# Patient Record
Sex: Male | Born: 1965 | Race: White | Hispanic: No | Marital: Single | Smoking: Never smoker
Health system: Southern US, Community
[De-identification: ages and names within clinical notes are randomized; demographics above are authoritative.]

## PROBLEM LIST (undated history)

## (undated) DIAGNOSIS — Z87442 Personal history of urinary calculi: Secondary | ICD-10-CM

## (undated) DIAGNOSIS — K635 Polyp of colon: Secondary | ICD-10-CM

## (undated) DIAGNOSIS — I493 Ventricular premature depolarization: Secondary | ICD-10-CM

## (undated) DIAGNOSIS — M129 Arthropathy, unspecified: Secondary | ICD-10-CM

## (undated) DIAGNOSIS — I10 Essential (primary) hypertension: Secondary | ICD-10-CM

## (undated) DIAGNOSIS — K5732 Diverticulitis of large intestine without perforation or abscess without bleeding: Secondary | ICD-10-CM

## (undated) DIAGNOSIS — K5190 Ulcerative colitis, unspecified, without complications: Secondary | ICD-10-CM

## (undated) DIAGNOSIS — C4491 Basal cell carcinoma of skin, unspecified: Secondary | ICD-10-CM

## (undated) DIAGNOSIS — I499 Cardiac arrhythmia, unspecified: Secondary | ICD-10-CM

## (undated) DIAGNOSIS — Z9189 Other specified personal risk factors, not elsewhere classified: Secondary | ICD-10-CM

## (undated) DIAGNOSIS — M4802 Spinal stenosis, cervical region: Secondary | ICD-10-CM

## (undated) DIAGNOSIS — G43109 Migraine with aura, not intractable, without status migrainosus: Principal | ICD-10-CM

## (undated) DIAGNOSIS — I2510 Atherosclerotic heart disease of native coronary artery without angina pectoris: Secondary | ICD-10-CM

## (undated) DIAGNOSIS — I4891 Unspecified atrial fibrillation: Secondary | ICD-10-CM

## (undated) DIAGNOSIS — T7840XA Allergy, unspecified, initial encounter: Secondary | ICD-10-CM

## (undated) DIAGNOSIS — K219 Gastro-esophageal reflux disease without esophagitis: Secondary | ICD-10-CM

## (undated) DIAGNOSIS — M1990 Unspecified osteoarthritis, unspecified site: Secondary | ICD-10-CM

## (undated) DIAGNOSIS — M5010 Cervical disc disorder with radiculopathy, unspecified cervical region: Principal | ICD-10-CM

## (undated) DIAGNOSIS — F329 Major depressive disorder, single episode, unspecified: Secondary | ICD-10-CM

## (undated) HISTORY — DX: Spinal stenosis, cervical region: M48.02

## (undated) HISTORY — DX: Major depressive disorder, single episode, unspecified: F32.9

## (undated) HISTORY — DX: Migraine with aura, not intractable, without status migrainosus: G43.109

## (undated) HISTORY — DX: Other specified personal risk factors, not elsewhere classified: Z91.89

## (undated) HISTORY — DX: Basal cell carcinoma of skin, unspecified: C44.91

## (undated) HISTORY — PX: FOOT SURGERY: SHX648

## (undated) HISTORY — DX: Ventricular premature depolarization: I49.3

## (undated) HISTORY — DX: Allergy, unspecified, initial encounter: T78.40XA

## (undated) HISTORY — PX: ROTATOR CUFF REPAIR: SHX139

## (undated) HISTORY — PX: COLONOSCOPY: SHX5424

## (undated) HISTORY — DX: Arthropathy, unspecified: M12.9

## (undated) HISTORY — DX: Unspecified atrial fibrillation: I48.91

## (undated) HISTORY — PX: CARDIAC CATHETERIZATION: SHX172

## (undated) HISTORY — PX: OTHER SURGICAL HISTORY: SHX169

## (undated) HISTORY — DX: Diverticulitis of large intestine without perforation or abscess without bleeding: K57.32

## (undated) HISTORY — PX: TONSILLECTOMY: SHX5217

## (undated) HISTORY — DX: Cervical disc disorder with radiculopathy, unspecified cervical region: M50.10

## (undated) HISTORY — DX: Atherosclerotic heart disease of native coronary artery without angina pectoris: I25.10

## (undated) HISTORY — DX: Ulcerative colitis, unspecified, without complications: K51.90

## (undated) SURGERY — Surgical Case
Anesthesia: *Unknown

---

## 2005-03-31 ENCOUNTER — Inpatient Hospital Stay: Payer: Self-pay | Admitting: Internal Medicine

## 2005-03-31 ENCOUNTER — Other Ambulatory Visit: Payer: Self-pay

## 2005-10-14 ENCOUNTER — Ambulatory Visit: Payer: Self-pay | Admitting: Internal Medicine

## 2006-01-11 ENCOUNTER — Emergency Department: Payer: Self-pay | Admitting: Emergency Medicine

## 2006-04-09 ENCOUNTER — Ambulatory Visit: Payer: Self-pay | Admitting: Internal Medicine

## 2008-05-29 ENCOUNTER — Ambulatory Visit: Payer: Self-pay | Admitting: Internal Medicine

## 2008-05-29 ENCOUNTER — Emergency Department: Payer: Self-pay | Admitting: Internal Medicine

## 2008-05-30 ENCOUNTER — Inpatient Hospital Stay: Payer: Self-pay | Admitting: Internal Medicine

## 2008-05-30 ENCOUNTER — Other Ambulatory Visit: Payer: Self-pay

## 2008-06-20 ENCOUNTER — Ambulatory Visit: Payer: Self-pay | Admitting: Cardiology

## 2008-07-17 ENCOUNTER — Ambulatory Visit: Payer: Self-pay | Admitting: Cardiology

## 2008-07-19 ENCOUNTER — Ambulatory Visit: Payer: Self-pay | Admitting: Cardiovascular Disease

## 2008-07-26 ENCOUNTER — Ambulatory Visit: Payer: Self-pay | Admitting: Cardiovascular Disease

## 2008-07-31 ENCOUNTER — Ambulatory Visit: Payer: Self-pay | Admitting: Cardiology

## 2008-08-04 ENCOUNTER — Ambulatory Visit: Payer: Self-pay

## 2008-08-11 ENCOUNTER — Ambulatory Visit: Payer: Self-pay | Admitting: Cardiology

## 2008-08-22 ENCOUNTER — Ambulatory Visit: Payer: Self-pay | Admitting: Internal Medicine

## 2008-08-25 ENCOUNTER — Ambulatory Visit: Payer: Self-pay | Admitting: Cardiovascular Disease

## 2008-09-08 ENCOUNTER — Ambulatory Visit: Payer: Self-pay | Admitting: Cardiology

## 2008-09-26 ENCOUNTER — Ambulatory Visit: Payer: Self-pay | Admitting: Cardiology

## 2008-10-06 ENCOUNTER — Ambulatory Visit: Payer: Self-pay | Admitting: Specialist

## 2008-12-26 ENCOUNTER — Ambulatory Visit: Payer: Self-pay | Admitting: Cardiology

## 2009-06-07 ENCOUNTER — Ambulatory Visit: Payer: Self-pay | Admitting: Family Medicine

## 2009-06-07 DIAGNOSIS — F3289 Other specified depressive episodes: Secondary | ICD-10-CM

## 2009-06-07 DIAGNOSIS — K5732 Diverticulitis of large intestine without perforation or abscess without bleeding: Secondary | ICD-10-CM | POA: Insufficient documentation

## 2009-06-07 DIAGNOSIS — M129 Arthropathy, unspecified: Secondary | ICD-10-CM | POA: Insufficient documentation

## 2009-06-07 DIAGNOSIS — F329 Major depressive disorder, single episode, unspecified: Secondary | ICD-10-CM | POA: Insufficient documentation

## 2009-06-07 DIAGNOSIS — Z9189 Other specified personal risk factors, not elsewhere classified: Secondary | ICD-10-CM | POA: Insufficient documentation

## 2009-06-07 DIAGNOSIS — I4891 Unspecified atrial fibrillation: Secondary | ICD-10-CM | POA: Insufficient documentation

## 2009-06-07 DIAGNOSIS — Z6841 Body Mass Index (BMI) 40.0 and over, adult: Secondary | ICD-10-CM

## 2009-06-07 HISTORY — DX: Arthropathy, unspecified: M12.9

## 2009-06-07 HISTORY — DX: Major depressive disorder, single episode, unspecified: F32.9

## 2009-06-07 HISTORY — DX: Other specified depressive episodes: F32.89

## 2009-06-07 HISTORY — DX: Unspecified atrial fibrillation: I48.91

## 2009-06-07 HISTORY — DX: Diverticulitis of large intestine without perforation or abscess without bleeding: K57.32

## 2009-06-07 LAB — CONVERTED CEMR LAB
ALT: 26 units/L (ref 0–53)
Albumin: 3.9 g/dL (ref 3.5–5.2)
BUN: 12 mg/dL (ref 6–23)
Chloride: 106 meq/L (ref 96–112)
Cholesterol: 141 mg/dL (ref 0–200)
Eosinophils Relative: 1.6 % (ref 0.0–5.0)
Glucose, Bld: 85 mg/dL (ref 70–99)
HCT: 43.2 % (ref 39.0–52.0)
Hemoglobin: 15.2 g/dL (ref 13.0–17.0)
Lymphs Abs: 1.6 10*3/uL (ref 0.7–4.0)
MCV: 87.3 fL (ref 78.0–100.0)
Monocytes Absolute: 0.3 10*3/uL (ref 0.1–1.0)
Neutro Abs: 3.6 10*3/uL (ref 1.4–7.7)
Platelets: 179 10*3/uL (ref 150.0–400.0)
Potassium: 4.5 meq/L (ref 3.5–5.1)
RDW: 13.3 % (ref 11.5–14.6)
Total Bilirubin: 1 mg/dL (ref 0.3–1.2)
WBC: 5.7 10*3/uL (ref 4.5–10.5)

## 2009-06-21 ENCOUNTER — Encounter: Payer: Self-pay | Admitting: Family Medicine

## 2009-06-22 ENCOUNTER — Encounter: Payer: Self-pay | Admitting: Family Medicine

## 2009-07-03 ENCOUNTER — Ambulatory Visit: Payer: Self-pay | Admitting: Cardiology

## 2009-07-27 ENCOUNTER — Telehealth: Payer: Self-pay | Admitting: Cardiology

## 2009-08-08 ENCOUNTER — Encounter: Payer: Self-pay | Admitting: Family Medicine

## 2009-08-13 ENCOUNTER — Encounter: Payer: Self-pay | Admitting: Family Medicine

## 2009-08-13 ENCOUNTER — Ambulatory Visit: Payer: Self-pay | Admitting: Urology

## 2009-09-26 ENCOUNTER — Telehealth: Payer: Self-pay | Admitting: Cardiology

## 2009-11-15 ENCOUNTER — Ambulatory Visit: Payer: Self-pay | Admitting: Family Medicine

## 2010-01-24 ENCOUNTER — Ambulatory Visit: Payer: Self-pay | Admitting: Cardiology

## 2010-01-25 ENCOUNTER — Encounter: Payer: Self-pay | Admitting: Cardiology

## 2010-07-31 ENCOUNTER — Ambulatory Visit: Payer: Self-pay | Admitting: Cardiology

## 2010-07-31 DIAGNOSIS — E785 Hyperlipidemia, unspecified: Secondary | ICD-10-CM | POA: Insufficient documentation

## 2010-08-02 LAB — CONVERTED CEMR LAB
ALT: 34 units/L (ref 0–53)
AST: 24 units/L (ref 0–37)
Albumin: 4.2 g/dL (ref 3.5–5.2)
Calcium: 9.4 mg/dL (ref 8.4–10.5)
Cholesterol: 152 mg/dL (ref 0–200)
GFR calc non Af Amer: 91.6 mL/min (ref >60)
Glucose, Bld: 80 mg/dL (ref 70–99)
HDL: 28.3 mg/dL — ABNORMAL LOW (ref >39.00)
Potassium: 4 meq/L (ref 3.5–5.1)
Sodium: 139 meq/L (ref 135–145)
Total Bilirubin: 0.7 mg/dL (ref 0.3–1.2)
Total Protein: 7.4 g/dL (ref 6.0–8.3)
Triglycerides: 133 mg/dL (ref 0.0–149.0)
VLDL: 26.6 mg/dL (ref 0.0–40.0)

## 2010-08-12 ENCOUNTER — Telehealth: Payer: Self-pay | Admitting: Cardiology

## 2010-11-26 ENCOUNTER — Telehealth (INDEPENDENT_AMBULATORY_CARE_PROVIDER_SITE_OTHER): Payer: Self-pay | Admitting: *Deleted

## 2010-11-29 ENCOUNTER — Other Ambulatory Visit: Payer: Self-pay | Admitting: Family Medicine

## 2010-11-29 ENCOUNTER — Ambulatory Visit
Admission: RE | Admit: 2010-11-29 | Discharge: 2010-11-29 | Payer: Self-pay | Source: Home / Self Care | Attending: Family Medicine | Admitting: Family Medicine

## 2010-11-29 LAB — CBC WITH DIFFERENTIAL/PLATELET
Basophils Absolute: 0 10*3/uL (ref 0.0–0.1)
Eosinophils Absolute: 0.2 10*3/uL (ref 0.0–0.7)
MCHC: 33.9 g/dL (ref 30.0–36.0)
MCV: 88.4 fl (ref 78.0–100.0)
Monocytes Absolute: 0.5 10*3/uL (ref 0.1–1.0)
Neutrophils Relative %: 61.2 % (ref 43.0–77.0)
Platelets: 230 10*3/uL (ref 150.0–400.0)
RDW: 14.6 % (ref 11.5–14.6)
WBC: 6.4 10*3/uL (ref 4.5–10.5)

## 2010-11-29 LAB — PSA: PSA: 0.49 ng/mL (ref 0.10–4.00)

## 2010-12-03 NOTE — Progress Notes (Signed)
Summary: pt has questions   Phone Note Call from Patient Call back at Home Phone [phone]   Caller: Patient Reason for Call: Talk to Nurse, Talk to Doctor Summary of Call: pt has questions regarding dietary suppliment Initial call taken by: Omer Jack,  August 12, 2010 12:04 PM  Follow-up for Phone Call        Pt would like Dr. Vern Claude approval for a dietary supplement which has nitrous oxide to stimulate blood vessels.  He does not remember the name of the supplement which comes from [facility]. He will fax [place] the information. Mylo Red RN

## 2010-12-03 NOTE — Assessment & Plan Note (Signed)
Summary: 52m f/u  Medications Added FLECAINIDE ACETATE 100 MG TABS (FLECAINIDE ACETATE) Take one tablet by mouth every 12 hours OMEPRAZOLE 20 MG CPDR (OMEPRAZOLE) 1 tab as needed        Visit Type:  6 mo f/u Primary Provider:  Hannah Beat MD  CC:  pt states no cardiac complaints today.  History of Present Illness: Brandon [name] comes in today for followup of his atrial fibrillation.  He's had no clinical recurrence. He has done remarkably well on flecainide.  His biggest concern is his weight gain. He tries to eat remarkably healthy but continues to gain weight. I reviewed his lipid panel today with him which looks favorable except for low HDL. Obviously, his diet his pain often this regard.  His swelling has been under control. His blood pressures also been better. He is very compliant with his medications and salt restriction.  Current Medications (verified): 1)  Diltiazem Hcl Coated Beads 240 Mg Xr24h-Cap (Diltiazem Hcl Coated Beads) .Marland Kitchen.. 1 Time A Day 2)  Flecainide Acetate 100 Mg Tabs (Flecainide Acetate) .... Take One Tablet By Mouth Every 12 Hours 3)  Aspirin 325 Mg Tabs (Aspirin) .... Once A Day 4)  Omeprazole 20 Mg Cpdr (Omeprazole) .Marland Kitchen.. 1 Tab As Needed  Allergies: 1)  ! Penicillin  Past History:  Past Surgical History: Last updated: 06/07/2009 Hosp, 2009, GU bleed  Review of Systems       negative other than history of present illness  Vital Signs:  Patient profile:   45 year old male Height:      74 inches Weight:      314 pounds BMI:     40.46 Pulse rate:   56 / minute Pulse rhythm:   irregular BP sitting:   118 / 80  (left arm) Cuff size:   large  Vitals Entered By: Danielle Rankin, CMA (January 24, 2010 9:55 AM)  Physical Exam  General:  obese.  muscularobese.   Head:  normocephalic and atraumatic Eyes:  PERRLA/EOM intact; conjunctiva and lids normal. Neck:  Neck supple, no JVD. No masses, thyromegaly or abnormal cervical nodes. Lungs:  Clear  bilaterally to auscultation and percussion. Heart:  Non-displaced PMI, chest non-tender; regular rate and rhythm, S1, S2 without murmurs, rubs or gallops. Carotid upstroke normal, no bruit. Normal abdominal aortic size, no bruits. Femorals normal pulses, no bruits. Pedals normal pulses. No edema, no varicosities. Msk:  Back normal, normal gait. Muscle strength and tone normal. Pulses:  pulses normal in all 4 extremities Extremities:  trace left pedal edema and trace right pedal edema.  trace left pedal edema.   Neurologic:  Alert and oriented x 3. Skin:  Intact without lesions or rashes. Psych:  depressed affect.  depressed affect.     EKG  Procedure date:  01/24/2010  Findings:       sinus bradycardia, PR QRS and QTC normal., no change  Impression & Recommendations:  Problem # 1:  ATRIAL FIBRILLATION, PAROXYSMAL (ICD-427.31) Assessment Improved  His updated medication list for this problem includes:    Flecainide Acetate 100 Mg Tabs (Flecainide acetate) .Marland Kitchen... Take one tablet by mouth every 12 hours    Aspirin 325 Mg Tabs (Aspirin) ..... Once a day  Problem # 2:  OBESITY (ICD-278.00) Assessment: Deteriorated  Problem # 3:  CHOLESTEROL, SCREENING (ICD-V77.91) I reviewed his numbers today obtain a primary care. Have encouraged continue with his healthy diet and his weight doesn't seem to respond. He is pretty discouraged by this. Trauma the pain  anymore.  Patient Instructions: 1)  Your physician recommends that you schedule a follow-up appointment in: 6 months with dr Leilah Polimeni 2)  Your physician recommends that you continue on your current medications as directed. Please refer to the Current Medication list given to you today.

## 2010-12-03 NOTE — Progress Notes (Signed)
Summary: [facility]   Imported By: Harlon Flor 02/06/2010 09:07:10  _____________________________________________________________________  External Attachment:    Type:   Image     Comment:   External Document

## 2010-12-03 NOTE — Assessment & Plan Note (Signed)
Summary: per check out/sf  Medications Added FLECAINIDE ACETATE 100 MG TABS (FLECAINIDE ACETATE) Take one tablet by mouth every 12 hours OMEPRAZOLE 20 MG CPDR (OMEPRAZOLE) 1 tab M, W, F FUROSEMIDE 20 MG TABS (FUROSEMIDE) Take one tablet by mouth every morning as needed for swelling        Visit Type:  6 mo f/u Primary Provider:  Hannah Beat MD  CC:  no cardiac complaints today..pt states he did not sleep well llast night.  History of Present Illness: [name] returns today for management of his atrial fibrillation,hyperlipidemia, hypertension, and now increased lower extremity edema. He is very unhappy about the latter like something for it.  He's had a Systems analyst. His measurements have improved his weight has not. I suspect his muscle mass.  He stands on his feet a lot during the day and on concrete. He says significant edema. He denies orthopnea or PND.  Denies any chest pain or angina. He has not had any recurrent symptoms of atrial fibrillation.  Current Medications (verified): 1)  Diltiazem Hcl Coated Beads 240 Mg Xr24h-Cap (Diltiazem Hcl Coated Beads) .Marland Kitchen.. 1 Time A Day 2)  Flecainide Acetate 100 Mg Tabs (Flecainide Acetate) .... Take One Tablet By Mouth Every 12 Hours 3)  Aspirin 325 Mg Tabs (Aspirin) .... Once A Day 4)  Omeprazole 20 Mg Cpdr (Omeprazole) .Marland Kitchen.. 1 Tab M, W, F  Allergies: 1)  ! Penicillin  Past History:  Past Medical History: Last updated: 06/07/2009 DIVERTICULITIS, COLON (ICD-562.11) DEPRESSION (ICD-311) CHICKENPOX, HX OF (ICD-V15.9) ARTHRITIS (ICD-716.90) Macroscopic hematuria, 2009, hosp A. Fib, rate controlled    Past Surgical History: Last updated: 06/07/2009 Hosp, 2009, GU bleed  Family History: Last updated: 06/07/2009 Family History of Arthritis Family History Diabetes 1st degree relative Family History of Stroke F 1st degree relative <60 Family History of Stroke M 1st degree relative <50  Social History: Last updated:  06/07/2009 Occupation: Self employeed Single Never Smoked, former dip - quit about 1 year ago Alcohol use-no, rare Drug use-no Regular exercise-no  Risk Factors: Alcohol Use: <1 (11/15/2009) Diet: poor (11/15/2009) Exercise: no (11/15/2009)  Risk Factors: Smoking Status: never (11/15/2009)  Review of Systems       negative other than history of present illness  Vital Signs:  Patient profile:   45 year old male Height:      74 inches Weight:      316.8 pounds BMI:     40.82 Pulse rate:   66 / minute Pulse rhythm:   irregular BP sitting:   122 / 90  (left arm) Cuff size:   large  Vitals Entered By: Danielle Rankin, CMA (July 31, 2010 4:10 PM)  Physical Exam  General:  muscular, overweight, in no acute distress Head:  normocephalic and atraumatic Eyes:  PERRLA/EOM intact; conjunctiva and lids normal. Neck:  Neck supple, no JVD. No masses, thyromegaly or abnormal cervical nodes. Chest Wall:  no deformities or breast masses noted Lungs:  Clear bilaterally to auscultation and percussion. Heart:  M. poorly appreciated, regular rate and rhythm, normal S1-S2, no carotid bruits Msk:  Back normal, normal gait. Muscle strength and tone normal. Pulses:  pulses normal in all 4 extremities Extremities:  1+ left pedal edema and 1+ right pedal edema.   Neurologic:  Alert and oriented x 3. Skin:  Intact without lesions or rashes. Psych:  Normal affect.   Problems:  Medical Problems Added: 1)  Dx of Edema  (ICD-782.3) 2)  Dx of Hyperlipidemia  (ICD-272.4)  EKG  Procedure date:  07/31/2010  Findings:      normal sinus rhythm, low voltage, normal PR QRS and QTC intervals.  Impression & Recommendations:  Problem # 1:  ATRIAL FIBRILLATION, PAROXYSMAL (ICD-427.31) He continues to do well on flecainide. No changes. Continue aspirin. His updated medication list for this problem includes:    Flecainide Acetate 100 Mg Tabs (Flecainide acetate) .Marland Kitchen... Take one tablet by mouth  every 12 hours    Aspirin 325 Mg Tabs (Aspirin) ..... Once a day  Orders: EKG w/ Interpretation (93000)  Problem # 2:  OBESITY (ICD-278.00) Assessment: Unchanged  Problem # 3:  CHOLESTEROL, SCREENING (ICD-V77.91) I will check lipids and LFTs today.  Problem # 4:  EDEMA (ICD-782.3) he clearly doesn't like the swelling. We'll start low-dose Lasix p.r.n. 20 mg q.a.m. p.r.n. Potassium rich diet reviewed.  Other Orders: TLB-BMP (Basic Metabolic Panel-BMET) (80048-METABOL) TLB-Lipid Panel (80061-LIPID) TLB-Hepatic/Liver Function Pnl (80076-HEPATIC)   Patient Instructions: 1)  Your physician recommends that you schedule a follow-up appointment in: 6 months with Dr. Daleen Squibb 2)  Your physician recommends that you return for lab work in: 3)  Your physician has requested that you limit the intake of sodium (salt) in your diet to two grams daily. Please see [facility] handout. Prescriptions: FUROSEMIDE 20 MG TABS (FUROSEMIDE) Take one tablet by mouth every morning as needed for swelling  #30 x 6   Entered by:   Lisabeth Devoid RN   Authorized by:   Gaylord Shih, MD, [facility]   Signed by:   Lisabeth Devoid RN on 07/31/2010   Method used:   Electronically to        [facility]  [address] (retail)       [address], [address]       [place], [place]  [address]       Ph: [phone]       Fax: [phone]   RxID:   [id number]

## 2010-12-03 NOTE — Assessment & Plan Note (Signed)
Summary: CPX/RBH   Vital Signs:  Patient profile:   45 year old male Height:      74 inches Weight:      306.2 pounds BMI:     39.46 Temp:     98.5 degrees F oral Pulse rate:   62 / minute Pulse rhythm:   regular BP sitting:   110 / 80  (left arm) Cuff size:   large  Vitals Entered By: Benny Lennert CMA Duncan Dull) (November 15, 2009 8:26 AM)  History of Present Illness: Chief complaint cpx  Labs all OK from 4 months ago  Having a lot of trouble with acid reflux  Taking some omeprazole. Took some nexium and that seemed to help better. Took for about three weeks. Diet is not good Not eating much vegetables.  Splitting some wood for exercise Prilosec is working ok, only taking every other day     Preventive Screening-Counseling & Management  Alcohol-Tobacco     Alcohol drinks/day: <1     Alcohol Counseling: not indicated; use of alcohol is not excessive or problematic     Smoking Status: never     Tobacco Counseling: not indicated; no tobacco use  Caffeine-Diet-Exercise     Diet Comments: poor     Diet Counseling: to improve diet; diet is suboptimal     Does Patient Exercise: no     Exercise Counseling: to improve exercise regimen  Hep-HIV-STD-Contraception     STD Risk Counseling: to avoid increased STD risk     Testicular SE Education/Counseling to perform regular STE      Sexual History:  currently monogamous.        Drug Use:  never.    Clinical Review Panels:  Immunizations   Last Flu Vaccine:  Fluvax 3+ (11/15/2009)  Lipid Management   Cholesterol:  141 (06/07/2009)   LDL (bad choesterol):  102 (06/07/2009)   HDL (good cholesterol):  28.20 (06/07/2009)  Diabetes Management   Creatinine:  0.9 (06/07/2009)   Last Flu Vaccine:  Fluvax 3+ (11/15/2009)  CBC   WBC:  5.7 (06/07/2009)   RBC:  4.94 (06/07/2009)   Hgb:  15.2 (06/07/2009)   Hct:  43.2 (06/07/2009)   Platelets:  179.0 (06/07/2009)   MCV  87.3 (06/07/2009)   MCHC  35.2 (06/07/2009)   RDW   13.3 (06/07/2009)   PMN:  64.6 (06/07/2009)   Lymphs:  27.2 (06/07/2009)   Monos:  5.6 (06/07/2009)   Eosinophils:  1.6 (06/07/2009)   Basophil:  1.0 (06/07/2009)  Complete Metabolic Panel   Glucose:  85 (06/07/2009)   Sodium:  141 (06/07/2009)   Potassium:  4.5 (06/07/2009)   Chloride:  106 (06/07/2009)   CO2:  28 (06/07/2009)   BUN:  12 (06/07/2009)   Creatinine:  0.9 (06/07/2009)   Albumin:  3.9 (06/07/2009)   Total Protein:  7.1 (06/07/2009)   Calcium:  9.4 (06/07/2009)   Total Bili:  1.0 (06/07/2009)   Alk Phos:  42 (06/07/2009)   SGPT (ALT):  26 (06/07/2009)   SGOT (AST):  21 (06/07/2009)   Allergies: 1)  ! Penicillin  Past History:  Past medical, surgical, family and social histories (including risk factors) reviewed, and no changes noted (except as noted below).  Past Medical History: Reviewed history from 06/07/2009 and no changes required. DIVERTICULITIS, COLON (ICD-562.11) DEPRESSION (ICD-311) CHICKENPOX, HX OF (ICD-V15.9) ARTHRITIS (ICD-716.90) Macroscopic hematuria, 2009, hosp A. Fib, rate controlled    Past Surgical History: Reviewed history from 06/07/2009 and no changes required. [place], 2009, GU  bleed  Family History: Reviewed history from 06/07/2009 and no changes required. Family History of Arthritis Family History Diabetes 1st degree relative Family History of Stroke F 1st degree relative <60 Family History of Stroke M 1st degree relative <50  Social History: Reviewed history from 06/07/2009 and no changes required. Occupation: Self employeed Single Never Smoked, former dip - quit about 1 year ago Alcohol use-no, rare Drug use-no Regular exercise-no Sexual History:  currently monogamous Drug Use:  never  Review of Systems  General: Denies fever, chills, sweats, anorexia, fatigue, weakness, malaise Eyes: Denies blurring, vision loss ENT: Denies earache, nasal congestion, nosebleeds, sore throat, and hoarseness.  Cardiovascular:  Denies chest pains, palpitations, syncope, dyspnea on exertion,  Respiratory: Denies cough, dyspnea at rest, excessive sputum,wheeezing GI: Denies nausea, vomiting, diarrhea, constipation, change in bowel habits, abdominal pain, melena, hematochezia, GERD NOTED GU: Denies dysuria, hematuria, discharge, urinary frequency, urinary hesitancy, nocturia, incontinence, genital sores, decreased libido Musculoskeletal: Denies back pain, joint pain Derm: Denies rash, itching Neuro: Denies  paresthesias, frequent falls, frequent headaches, and difficulty walking.  Psych: Denies depression, anxiety Endocrine: Denies cold intolerance, heat intolerance, polydipsia, polyphagia, polyuria, and unusual weight change.  Heme: Denies enlarged lymph nodes Allergy: No hayfever   Physical Exam  General:  Well-developed,well-nourished,in no acute distress; alert,appropriate and cooperative throughout examination Head:  Normocephalic and atraumatic without obvious abnormalities. No apparent alopecia or balding. Eyes:  vision grossly intact.   Ears:  External ear exam shows no significant lesions or deformities.  Otoscopic examination reveals clear canals, tympanic membranes are intact bilaterally without bulging, retraction, inflammation or discharge. Hearing is grossly normal bilaterally. Nose:  External nasal examination shows no deformity or inflammation. Nasal mucosa are pink and moist without lesions or exudates. Mouth:  Oral mucosa and oropharynx without lesions or exudates.  Teeth in good repair. Neck:  No deformities, masses, or tenderness noted. Chest Wall:  No deformities, masses, tenderness or gynecomastia noted. Lungs:  Normal respiratory effort, chest expands symmetrically. Lungs are clear to auscultation, no crackles or wheezes. Heart:  Normal rate and regular rhythm. S1 and S2 normal without gallop, murmur, click, rub or other extra sounds. Abdomen:  Bowel sounds positive,abdomen soft and non-tender  without masses, organomegaly or hernias noted. obese Rectal:  No external abnormalities noted. Normal sphincter tone. No rectal masses or tenderness. Genitalia:  Testes bilaterally descended without nodularity, tenderness or masses. No scrotal masses or lesions. No penis lesions or urethral discharge. Prostate:  Prostate gland firm and smooth, no enlargement, nodularity, tenderness, mass, asymmetry or induration. Msk:  normal ROM and no crepitation.   Extremities:  No clubbing, cyanosis, edema, or deformity noted with normal full range of motion of all joints.   Neurologic:  alert & oriented X3, sensation intact to light touch, and gait normal.   Skin:  Intact without suspicious lesions or rashes Cervical Nodes:  No lymphadenopathy noted Inguinal Nodes:  No significant adenopathy Psych:  Cognition and judgment appear intact. Alert and cooperative with normal attention span and concentration. No apparent delusions, illusions, hallucinations   Impression & Recommendations:  Problem # 1:  HEALTH MAINTENANCE EXAM (ICD-V70.0) The patient's preventative maintenance and recommended screening tests for an annual wellness exam were reviewed in full today. Brought up to date unless services declined.  Counselled on the importance of diet, exercise, and its role in overall health and mortality. The patient's FH and SH was reviewed, including their home life, tobacco status, and drug and alcohol status.   Needs to work on losing  weight  Complete Medication List: 1)  Diltiazem Hcl Coated Beads 240 Mg Xr24h-cap (Diltiazem hcl coated beads) .Marland Kitchen.. 1 time a day 2)  Flecainide Acetate 100 Mg Tabs (Flecainide acetate) .... Take one tablet by mouth every 12 hours 3)  Aspirin 325 Mg Tabs (Aspirin) .... Once a day  Other Orders: Admin 1st Vaccine (60454) Flu Vaccine 20yrs + (09811)  Patient Instructions: 1)  physical in a year.  Current Allergies (reviewed today): ! PENICILLIN      Flu Vaccine  Consent Questions     Do you have a history of severe allergic reactions to this vaccine? no    Any prior history of allergic reactions to egg and/or gelatin? no    Do you have a sensitivity to the preservative Thimersol? no    Do you have a past history of Guillan-Barre Syndrome? no    Do you currently have an acute febrile illness? no    Have you ever had a severe reaction to latex? no    Vaccine information given and explained to patient? yes    Are you currently pregnant? no    Lot Number:[id number]   Exp Date:05/02/2010   Site Given  Left Deltoid IMlbflu

## 2010-12-04 ENCOUNTER — Encounter: Payer: Self-pay | Admitting: Family Medicine

## 2010-12-04 ENCOUNTER — Ambulatory Visit: Admit: 2010-12-04 | Payer: Self-pay | Admitting: Family Medicine

## 2010-12-04 ENCOUNTER — Encounter (INDEPENDENT_AMBULATORY_CARE_PROVIDER_SITE_OTHER): Payer: BC Managed Care – PPO | Admitting: Family Medicine

## 2010-12-04 DIAGNOSIS — Z0000 Encounter for general adult medical examination without abnormal findings: Secondary | ICD-10-CM

## 2010-12-04 DIAGNOSIS — Z23 Encounter for immunization: Secondary | ICD-10-CM

## 2010-12-05 NOTE — Progress Notes (Signed)
----   Converted from flag ---- ---- 11/22/2010 1:33 PM, Owens Loffler MD wrote: PSA: v76.44 CBC with diff: v58.69  ---- 11/22/2010 12:51 PM, Daralene Milch CMA (AAMA) wrote: Lab orders please! Good Morning! This pt is scheduled for cpx labs Friday, which labs to draw and dx codes to use? Thanks Tasha ------------------------------

## 2010-12-11 NOTE — Assessment & Plan Note (Signed)
Summary: cpe CLE   Vital Signs:  Patient profile:   45 year old male Height:      74 inches Weight:      329.25 pounds BMI:     42.43 Temp:     98.8 degrees F oral Pulse rate:   72 / minute Pulse rhythm:   regular BP sitting:   110 / 72  (left arm) Cuff size:   large  Vitals Entered By: Benny Lennert CMA (AAMA) (December 04, 2010 8:40 AM)  History of Present Illness: 45 year old here for CPX:  No colon CA F Hx.  Shoulders and knees are bothering him some and hurting some.  No girlfriend.   Never had children. Married once.  Covered   Had a poodle, just died at 45 years of age.     Preventive Screening-Counseling & Management  Alcohol-Tobacco     Alcohol drinks/day: <1     Alcohol Counseling: not indicated; use of alcohol is not excessive or problematic     Smoking Status: never     Tobacco Counseling: not indicated; no tobacco use  Caffeine-Diet-Exercise     Diet Comments: poor     Diet Counseling: to improve diet; diet is suboptimal     Does Patient Exercise: yes     Type of exercise: Rush     Exercise (avg: min/session): >60     Times/week: 3     Exercise Counseling: to improve exercise regimen  Comments: quit dip 2 years ago  Hep-HIV-STD-Contraception     STD Risk Counseling: to avoid increased STD risk     Testicular SE Education/Counseling to perform regular STE  Safety-Violence-Falls     Seat Belt Use: yes      Drug Use:  never.    Clinical Review Panels:  Prevention   Last PSA:  0.49 (11/29/2010)  Immunizations   Last Tetanus Booster:  Tdap (12/04/2010)   Last Flu Vaccine:  Fluvax 3+ (11/15/2009)  Lipid Management   Cholesterol:  152 (07/31/2010)   LDL (bad choesterol):  97 (07/31/2010)   HDL (good cholesterol):  28.30 (07/31/2010)  Diabetes Management   Creatinine:  0.9 (07/31/2010)   Last Flu Vaccine:  Fluvax 3+ (11/15/2009)  CBC   WBC:  6.4 (11/29/2010)   RBC:  4.83 (11/29/2010)   Hgb:  14.4 (11/29/2010)   Hct:  42.7  (11/29/2010)   Platelets:  230.0 (11/29/2010)   MCV  88.4 (11/29/2010)   MCHC  33.9 (11/29/2010)   RDW  14.6 (11/29/2010)   PMN:  61.2 (11/29/2010)   Lymphs:  28.6 (11/29/2010)   Monos:  7.1 (11/29/2010)   Eosinophils:  2.5 (11/29/2010)   Basophil:  0.6 (11/29/2010)  Complete Metabolic Panel   Glucose:  80 (07/31/2010)   Sodium:  139 (07/31/2010)   Potassium:  4.0 (07/31/2010)   Chloride:  107 (07/31/2010)   CO2:  25 (07/31/2010)   BUN:  18 (07/31/2010)   Creatinine:  0.9 (07/31/2010)   Albumin:  4.2 (07/31/2010)   Total Protein:  7.4 (07/31/2010)   Calcium:  9.4 (07/31/2010)   Total Bili:  0.7 (07/31/2010)   Alk Phos:  50 (07/31/2010)   SGPT (ALT):  34 (07/31/2010)   SGOT (AST):  24 (07/31/2010)   Current Problems (verified): 1)  Special Screening Malignant Neoplasm of Prostate  (ICD-V76.44) 2)  Long-term (CURRENT) Use of Other Medications  (ICD-V58.69) 3)  Hyperlipidemia  (ICD-272.4) 4)  Health Maintenance Exam  (ICD-V70.0) 5)  Atrial Fibrillation, Paroxysmal  (ICD-427.31) 6)  Obesity  (ICD-278.00) 7)  Diverticulitis, Colon  (ICD-562.11) 8)  Depression  (ICD-311) 9)  Chickenpox, Hx of  (ICD-V15.9) 10)  Arthritis  (ICD-716.90)  Allergies: 1)  ! Penicillin  Past History:  Past medical, surgical, family and social histories (including risk factors) reviewed, and no changes noted (except as noted below).  Past Medical History: Reviewed history from 06/07/2009 and no changes required. DIVERTICULITIS, COLON (ICD-562.11) DEPRESSION (ICD-311) CHICKENPOX, HX OF (ICD-V15.9) ARTHRITIS (ICD-716.90) Macroscopic hematuria, 2009, hosp A. Fib, rate controlled    Past Surgical History: Reviewed history from 06/07/2009 and no changes required. Hosp, 2009, GU bleed  Family History: Reviewed history from 06/07/2009 and no changes required. Family History of Arthritis Family History Diabetes 1st degree relative Family History of Stroke F 1st degree relative <60 Family  History of Stroke M 1st degree relative <50  Social History: Reviewed history from 06/07/2009 and no changes required. Occupation: Self employeed Single Never Smoked, former dip - quit about 1 year ago Alcohol use-no, rare Drug use-no Regular exercise-no Does Patient Exercise:  yes  Review of Systems  General: Denies fever, chills, sweats, anorexia, fatigue, weakness, malaise Eyes: VISION WORSENED SOME ENT: Denies earache, nasal congestion, nosebleeds, sore throat, and hoarseness.  Cardiovascular: Denies chest pains, palpitations, syncope, dyspnea on exertion,  Respiratory: Denies cough, dyspnea at rest, excessive sputum,wheeezing GI: Denies nausea, vomiting, diarrhea, constipation, change in bowel habits, abdominal pain, melena, hematochezia GU: Denies dysuria, hematuria, discharge, urinary frequency, urinary hesitancy, nocturia, incontinence, genital sores, decreased libido Musculoskeletal:INTERMITTENT KNEE AND SHOULDER PAIN Derm: Denies rash, itching Neuro: Denies  paresthesias, frequent falls, frequent headaches, and difficulty walking.  Psych: Denies depression, anxiety Endocrine: Denies cold intolerance, heat intolerance, polydipsia, polyphagia, polyuria, and unusual weight change.  Heme: Denies enlarged lymph nodes Allergy: No hayfever   Otherwise, the pertinent positives and negatives are listed above and in the HPI, otherwise a full review of systems has been reviewed and is negative unless noted positive.    Impression & Recommendations:  Problem # 1:  HEALTH MAINTENANCE EXAM (ICD-V70.0) The patient's preventative maintenance and recommended screening tests for an annual wellness exam were reviewed in full today. Brought up to date unless services declined.  Counselled on the importance of diet, exercise, and its role in overall health and mortality. The patient's FH and SH was reviewed, including their home life, tobacco status, and drug and alcohol status.    making strides to try lose weight -- down from 380. Trying to eat better  Complete Medication List: 1)  Diltiazem Hcl Coated Beads 240 Mg Xr24h-cap (Diltiazem hcl coated beads) .Marland Kitchen.. 1 time a day 2)  Flecainide Acetate 100 Mg Tabs (Flecainide acetate) .... Take one tablet by mouth every 12 hours 3)  Aspirin 325 Mg Tabs (Aspirin) .... Once a day 4)  Omeprazole 20 Mg Cpdr (Omeprazole) .Marland Kitchen.. 1 tab m, w, f 5)  Furosemide 20 Mg Tabs (Furosemide) .... Take one tablet by mouth every morning as needed for swelling  Other Orders: Tdap => 64yrs IM (16109) Admin 1st Vaccine (60454)   Orders Added: 1)  Tdap => 60yrs IM [90715] 2)  Admin 1st Vaccine [90471] 3)  Est. Patient 40-64 years [09811]   Immunizations Administered:  Tetanus Vaccine:    Vaccine Type: Tdap    Site: left deltoid    Mfr: GlaxoSmithKline    Dose: 0.5 ml    Route: IM    Given by: Benny Lennert CMA (AAMA)    Exp. Date: 08/22/2012    Lot #: [id number]  VIS given: 09/20/08 version given December 04, 2010.   Immunizations Administered:  Tetanus Vaccine:    Vaccine Type: Tdap    Site: left deltoid    Mfr: GlaxoSmithKline    Dose: 0.5 ml    Route: IM    Given by: Benny Lennert CMA (AAMA)    Exp. Date: 08/22/2012    Lot #: [id number]    VIS given: 09/20/08 version given December 04, 2010.  Prevention & Chronic Care Immunizations   Influenza vaccine: Fluvax 3+  (11/15/2009)   Influenza vaccine deferral: Not available  (12/04/2010)    Tetanus booster: 12/04/2010: Tdap    Pneumococcal vaccine: Not documented  Other Screening   Smoking status: never  (12/04/2010)  Lipids   Total Cholesterol: 152  (07/31/2010)   LDL: 97  (07/31/2010)   LDL Direct: Not documented   HDL: 28.30  (07/31/2010)   Triglycerides: 133.0  (07/31/2010)    SGOT (AST): 24  (07/31/2010)   SGPT (ALT): 34  (07/31/2010)   Alkaline phosphatase: 50  (07/31/2010)   Total bilirubin: 0.7  (07/31/2010)  Self-Management Support :     Lipid self-management support: Not documented    Nursing Instructions: Give tetanus booster today    Current Allergies (reviewed today): ! PENICILLIN  Physical Exam General Appearance: well developed, well nourished, no acute distress Eyes: conjunctiva and lids normal, PERRLA, EOMI Ears, Nose, Mouth, Throat: TM clear, nares clear, oral exam WNL Neck: supple, no lymphadenopathy, no thyromegaly, no JVD Respiratory: clear to auscultation and percussion, respiratory effort normal Cardiovascular: regular rate and rhythm, S1-S2, no murmur, rub or gallop, no bruits, peripheral pulses normal and symmetric, no cyanosis, clubbing, edema or varicosities Chest: no scars, masses, tenderness; no asymmetry, skin changes, nipple discharge, no gynecomastia   Gastrointestinal: soft, non-tender; no hepatosplenomegaly, masses; active bowel sounds all quadrants, no masses, tenderness, hemorrhoids  Genitourinary: no hernia, testicular mass, penile discharge, priapism or prostate enlargement Lymphatic: no cervical, axillary or inguinal adenopathy Musculoskeletal: gait normal, muscle tone and strength WNL, no joint swelling, effusions, discoloration, crepitus  Shoulder: B Inspection: No muscle wasting or winging Ecchymosis/edema: neg  AC joint, scapula, clavicle: NT Cervical spine: NT, full ROM Spurling's: neg Abduction: full, 5/5 Flexion: full, 5/5 IR, full, lift-off: 5/5 ER at neutral: full, 5/5 AC crossover and compression: neg Neer: neg Hawkins: neg Drop Test: neg Empty Can: neg Supraspinatus insertion: NT Bicipital groove: NT Speed's: neg Yergason's: neg Sulcus sign: neg Scapular dyskinesis: none C5-T1 intact Sensation intact Grip 5/5  Skin: clear, good turgor, color WNL, no rashes, lesions, or ulcerations Neurologic: normal mental status, normal reflexes, normal strength, sensation, and motion Psychiatric: alert; oriented to person, place and time Other Exam:

## 2011-02-10 ENCOUNTER — Encounter: Payer: Self-pay | Admitting: Cardiology

## 2011-02-14 ENCOUNTER — Encounter: Payer: Self-pay | Admitting: Cardiology

## 2011-02-14 ENCOUNTER — Ambulatory Visit (INDEPENDENT_AMBULATORY_CARE_PROVIDER_SITE_OTHER): Payer: BC Managed Care – PPO | Admitting: Cardiology

## 2011-02-14 VITALS — BP 126/86 | HR 57 | Resp 18 | Ht 74.0 in | Wt 324.8 lb

## 2011-02-14 DIAGNOSIS — I4891 Unspecified atrial fibrillation: Secondary | ICD-10-CM

## 2011-02-14 NOTE — Assessment & Plan Note (Signed)
Stable, no change in treatment.

## 2011-02-14 NOTE — Patient Instructions (Signed)
Your physician recommends that you schedule a follow-up appointment in: 6 months with Dr. Wall  

## 2011-02-14 NOTE — Progress Notes (Signed)
   Patient ID: Earl Losee, male    DOB: 15-Oct-1966, 45 y.o.   MRN: [id number]  HPI Mr Olivera returns for E and M of his PAF. He has had no clinical sxs of his PAF. He remains on Flecainide and ASA.  EKG show SB with normal intervals.    Review of Systems  All other systems reviewed and are negative.      Physical Exam  Constitutional: He is oriented to person, place, and time. He appears well-developed and well-nourished.       Muscular, obese.  HENT:  Head: Normocephalic and atraumatic.  Eyes: EOM are normal. Pupils are equal, round, and reactive to light.  Neck: Neck supple. No JVD present. No tracheal deviation present. No thyromegaly present.  Cardiovascular: Normal rate, regular rhythm, normal heart sounds and intact distal pulses.   No murmur heard. Pulmonary/Chest: Effort normal and breath sounds normal.  Abdominal: Soft. Bowel sounds are normal.  Musculoskeletal: Normal range of motion. He exhibits no edema.  Neurological: He is alert and oriented to person, place, and time.  Skin: Skin is warm and dry.  Psychiatric: He has a normal mood and affect.

## 2011-03-02 ENCOUNTER — Other Ambulatory Visit: Payer: Self-pay | Admitting: Cardiology

## 2011-03-18 NOTE — Assessment & Plan Note (Signed)
[facility] OFFICE NOTE   NAME:[name], [name]                  MRN:          [id number]  DATE:09/26/2008                            DOB:          [birth year]    [name] comes in today with history of paroxysmal atrial  fibrillation.  He has now been on flecainide for over a month and has  had no recurrences.   His basic complaint is continuing to gain weight.  He has had some lower  extremity edema.   He denies any presyncope, syncope, tachy palpitations.   CURRENT MEDICATONS:  1. Aspirin 325 mg a day.  2. Cartia XT 300 mg a day.  3. Flecainide 100 mg b.i.d.  4. Coumadin as directed.   His blood pressure today is 110/76, pulse is 50 and regular.  EKG shows  sinus brady with a normal PR, QRS, and QTc interval.  HEENT:  Unchanged.  NECK:  Shows no JVD.  LUNGS:  Clear to auscultation and percussion.  HEART:  Soft, S1 and S2, slow rate.  ABDOMEN:  Soft.  Extremities:  1+ edema pretibially.  NEURO:  Intact.   Brandon [name] has had no recurrences of atrial fib on flecainide.  He is  extremely symptomatic when he has these.  With a low [place] score, we will  stop his Coumadin.  In addition, with his peripheral edema and  bradycardia, we will decrease his Cartia XT to 240 mg per day.  I will  see him back again in 3 months.     Brandon C. Verl Blalock, MD, [facility]  Electronically Signed    TCW/MedQ  DD: 09/26/2008  DT: 09/27/2008  Job #: [id number]

## 2011-03-18 NOTE — Letter (Signed)
August 22, 2008    Marijo Conception. Verl Blalock, MD, [place], [place]  [place], [place]   RE:  GARRETT, MITCHUM  MRN:  [id number]  /  DOB:  [birth year]   Dear Brandon [name],   It was my pleasure to see your patient, Brandon [name], in EP  consultation today.  As you recall, he is a very pleasant 45 year old  gentleman with symptomatic persistent atrial fibrillation.  He reports  initially being diagnosed with atrial fibrillation 7-8 years ago.  In  retrospect, he has had symptomatic atrial fibrillation for 10-15 years.  He describes symptoms of abrupt onset and termination of irregular  palpitations.  These palpitations are frequently associated with  shortness of breath and profound decrease in exercise capacity.  He  reports fatigue and inability to being able to walk more than 15 feet  due to dyspnea.  These episodes have increased in frequency and duration  over the past 7-8 years.  He reports that the episodes typically occur  at night.  He also reports a vagal triggers including and drinking cold  drinks quickly.  He was recently evaluated by you and initiated on  flecainide 100 mg twice daily.  Since initiating flecainide, he has  maintained sinus rhythm with no further symptomatic episodes of atrial  fibrillation.  He reports mild subjective short-term memory loss over  the past few weeks, which he attributes to flecainide, but denies other  symptoms.  He is otherwise without complaint today.   PAST MEDICAL HISTORY:  1. Vaguely mediated atrial fibrillation (as above).  2. Degenerative joint disease.  3. Morbid obesity.   MEDICATIONS:  1. Aspirin 325 mg daily.  2. Cartia XT 300 mg daily.  3. Coumadin to maintain an INR between 2-3.  4. Flecainide 100 mg twice daily.   SOCIAL HISTORY:  The patient lives in [place], [place] alone.  He  is divorced and has no children.  He denies tobacco, alcohol, or drug  use presently, but previously drank  occasional alcohol and chewed Skoal.   FAMILY HISTORY:  The patient denies any family history of atrial  fibrillation or arrhythmias.   REVIEW OF SYSTEMS:  All systems are reviewed and negative except as  outlined in the HPI above and as documented below.  The patient reports  losing 140 pounds over the past 15 months intentionally.   PHYSICAL EXAMINATION:  VITALS:  Blood pressure 140/80, heart rate 60,  respirations 18, and weight 294 pounds.  GENERAL:  The patient is an obese male, in no acute distress.  He is  alert and oriented x3.  HEENT:  Normocephalic and atraumatic.  Sclerae clear.  Conjunctivae  pink.  Oropharynx clear.  NECK:  Supple.  No JVD, lymphadenopathy, or bruits.  LUNGS:  Clear to auscultation bilaterally.  HEART:  Regular rate and rhythm.  No murmurs, rubs, or gallops.  GI:  Soft, nontender, and nondistended.  Positive bowel sounds.  EXTREMITIES:  No clubbing, cyanosis, or edema.  NEUROLOGIC:  Strength and sensation are intact.  SKIN:  No ecchymosis or laceration.  MUSCULOSKELETAL:  No deformity or atrophy.  PSYCH:  Euthymic mood.  Full affect.   EKG - normal sinus rhythm at 60 beats per minute, otherwise normal EKG.   IMPRESSION:  [name] is a very pleasant 45 year old gentleman with  vaguely-mediated atrial fibrillation who presents today for EP  consultation regarding therapeutic strategies for atrial fibrillation.  He is currently anticoagulated with Coumadin.  Brandon [name], I  agree with you  that his CHADS score at most 1.  It is reasonable to treat him with  aspirin or Coumadin.  Presently, he appears to be tolerating Coumadin  quite well, so I therefore think that we should just continue his  present strategy with a goal INR of closer to 2.  Having recently  started the patient on flecainide, he reports significant improvement  with his symptoms of atrial fibrillation.  I also think that he is  probably tolerating flecainide quite well.  It would certainly  be  reasonable to continue the patient on his current medicine strategy and  following it long term.   PLAN:  I have discussed therapeutic strategies of atrial fibrillation  management with the patient in detail.  These therapies include both  medicine and catheter-based therapies.  Risks, benefits, and  alternatives, EP study, and radiofrequency ablation were also discussed  in detail with the patient.  I think that he would be an ideal candidate  for catheter ablation should he fail medical therapy with flecainide.  The patient understands this.  He wishes to continues current strategy  for the present time, but should his atrial fibrillation recur or should  he have further symptoms on  atrial fibrillation, he may consider ablation down the road.  I  appreciate the opportunity for participating in the care of Mr.  [name].  Presently, he wishes to continue to be followed closely in  your office.  I have provided him with my card and he will contact me  should he wish to proceed with catheter ablation in the future.    Sincerely,      Thompson Grayer, MD  Electronically Signed    JA/MedQ  DD: 08/22/2008  DT: 08/23/2008  Job #: [id number]

## 2011-03-18 NOTE — Assessment & Plan Note (Signed)
[facility] OFFICE NOTE   NAME:Brandon [name], Brandon [name]                  MRN:          [id number]  DATE:12/26/2008                            DOB:          11-17-1965    Brandon [name] comes in today for further management of paroxysmal atrial  fibrillation.   He has had no clinical recurrences of atrial fib.  He is tolerating the  flecainide without side effects.  He is no longer on Coumadin and takes  aspirin 325 a day.  He reports no bleeding.   CURRENT MEDICATIONS:  1. Aspirin 325 mg a day.  2. Cartia XT 240 mg a day.  3. Flecainide 100 mg p.o. b.i.d.  4. Xanax p.r.n.   PHYSICAL EXAMINATION:  VITAL SIGNS:  His blood pressure today is 120/74,  his pulse is 74 and regular, and his weight is 294.  HEENT:  Normal.  Heavy beard.  NECK:  Supple.  Carotid upstrokes were equal bilateral without bruits.  No JVD.  Thyroid is not enlarged.  Trachea is midline.  LUNGS:  Clear to  auscultation and percussion.  HEART:  A nondisplaced PMI.  Normal S1 and S2.  No gallop.  No rub.  ABDOMEN:  Soft.  Good bowel sounds.  EXTREMITIES:  No cyanosis, clubbing, or edema.  Pulses are intact.  NEUROLOGIC:  Intact.   EKG shows sinus rhythm, low-voltage QRS, normal PR, QRS and QTc  interval.   Brandon [name] is doing well.  I have made no change in his medical  program.  We will see him back in 6 months.     Thomas C. Verl Blalock, MD, [facility]  Electronically Signed    TCW/MedQ  DD: 12/26/2008  DT: 12/26/2008  Job #: [id number]

## 2011-03-18 NOTE — Assessment & Plan Note (Signed)
[facility] OFFICE NOTE   NAME:Brandon [name]                  MRN:          [id number]  DATE:07/26/2008                            DOB:          1966-06-06    REASON FOR VISIT:  Atrial fibrillation.   HISTORY OF PRESENT ILLNESS:  Brandon [name] is 45 year old gentleman with  paroxysmal atrial fibrillation that dates back several years.  He has  been treated with rate control.  However, he feels very poorly when he  is in atrial fibrillation.  He has been anticoagulated in the past, but  described hematuria with an elevated INR.  His Coumadin has subsequently  been discontinued.  His CHADS score is actually 0.  His main symptom is  fatigue.  He also describes palpitations.  He denies chest pain or  dyspnea.  He does physical work and is unable to get through his work  today because of profound fatigue that correlates with his atrial  fibrillation.  He has been in atrial fibrillation now for approximately  2 weeks.   MEDICATIONS:  1. Aspirin 325 mg daily.  2. Cartia XT 300 mg daily.  3. Metoprolol 50 mg twice daily.   ALLERGIES:  PENICILLIN.   PHYSICAL EXAMINATION:  GENERAL:  The patient is alert and oriented.  He  is an obese male, in no acute distress.  VITAL SIGNS:  Weight is 293 pounds, blood pressure 102/70, heart rate  84, and respiratory rate 16.  HEENT:  Normal.  NECK:  Normal carotid upstrokes.  No bruits.  JVP normal.  LUNGS:  Clear bilaterally.  HEART:  Irregularly irregular without murmurs or gallops.  ABDOMEN:  Soft, obese, and nontender.  EXTREMITIES:  No clubbing, cyanosis, or edema.   EKG shows atrial fibrillation and otherwise, is within normal limits.  The ventricular rate is 84 beats per minute.   Studies reviewed from Dr. Jennette Kettle office include an echocardiogram from  July 10 of this year that described normal LV and RV function.  The LVEF  was estimated at 50%.  Atrial size was  normal.  No significant valvular  disease.   An exercise treadmill stress study from April 13 of this year was  interpreted as negative for ischemia with normal functional capacity.  The patient exercised 9 minutes.  I suspect, this was according to the  Bruce protocol, but I am not sure there were no significant EKG changes  described and the maximum heart rate was 142, which was 80% predicted.   ASSESSMENT:  This is a 45 year old gentleman with symptomatic atrial  fibrillation.  He clearly will be better off in sinus rhythm based on  his profound symptoms of atrial fibrillation .  He has a structurally  normal heart and had a negative exercise treadmill study earlier this  year.  We had a lengthy discussion regarding possible treatment options.  We have agreed on the following plan.  1. Resume Coumadin today in anticipation of cardioversion (either      chemical or electrical).  We will start on 4 mg daily and check INR  on Monday.  Of note, this dose is less than his previous dose where      he became supratherapeutic and had bleeding problems.  2. Start flecainide 50 mg twice daily x4 days, then 100 mg twice      daily.  3. Check EKG when he comes in for his INR early next week.  If he      remains in atrial fibrillation, recommend TEE cardioversion.  We      would prefer this approach as opposed to 3 weeks of therapeutic      anticoagulation because of the patient's symptoms and his need to      return to work.  4. After he is cardioverted, he will require an exercise stress study.      I would favor an exercise Myoview study both to evaluate his rhythm      on flecainide and to evaluate for ischemia.  5. Referral to Dr. Rayann Heman in our electrophysiology department.  The      patient is interested in atrial fibrillation ablation and would      like to discuss this possibility.     Brandon Bond. Burt Knack, MD  Electronically Signed    MDC/MedQ  DD: 07/26/2008  DT: 07/27/2008   Job #: [id number]   cc:   Thomas C. Wall, MD, [facility]

## 2011-03-18 NOTE — Assessment & Plan Note (Signed)
[facility] OFFICE NOTE   NAME:[name], [name]                  MRN:          [id number]  DATE:06/20/2008                            DOB:          [birth year]    [name] is a 45 year old single white male who is self-  referred through the [facility] for a second opinion concerning his  atrial fibrillation.   He says he has had intermittent atrial fib for the last 10 years or so.  He feels it is beating fast and it goes up into his throat.  I am not  sure it has ever been truly documented until recently.   He has been seen by Dr. Lavera Guise who actually had an EKG showing atrial  fib on May 11, 2008.  At that time, he was placed on Coumadin 5 mg a  day.  He had been on long-term Cardizem, which he said always made him  feel bad.   Unfortunately, he experienced hematuria and had to go to [facility].  He had blood work there, but I cannot find his INR.  He had a CT scan of the pelvis and the head, which was negative.  His  Coumadin was reversed with vitamin K.  Details unknown.   He is now on just aspirin 81 mg a day.  His hematuria has resolved.  He  had had no previous history of hematuria.   He has had a stress test on February 14, 2008, which was negative.  In  addition, he has had several 2-D echocardiograms, which showed  essentially normal LV function, normal left atrial size, mildly enlarged  right atrium, mildly enlarged right ventricle, and no valvular disease.   He says he feels the best he has ever felt off the Cardizem.   PAST MEDICAL HISTORY:   ALLERGIES:  He is intolerant to PENICILLIN.   CURRENT MEDICATIONS:  He is currently on aspirin 81 mg a day.  He takes  Xanax p.r.n.   He does not smoke, drink, or use any alcohol at all.  He uses no  recreational products.   Family history is negative for premature coronary artery disease.   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  This is how he knows the Byrds.  He is single and  has no children.   REVIEW OF SYSTEMS:  He has seasonal allergies, history of anxiety,  depression, and some arthritis.   PHYSICAL EXAMINATION:  His blood pressure today is 130/81 and his pulse  is 51.  EKG from Dr. Jennette Kettle office on May 29, 2008, showed sinus  bradycardia with a normal PR, QRS, and QTc interval.  His weight is 291.  He is 6 feet 2 inches.  He is very pleasant.  HEENT, normocephalic and  atraumatic.  PERRLA.  Extraocular movements intact.  Sclerae are clear.  Facial symmetry is normal.  Dentition is satisfactory. Neck is supple.  Carotid upstrokes are equal bilaterally without bruits.  No JVD.  Thyroid is not enlarged.  Trachea is midline.  Lungs are clear.  Heart  reveals a poorly appreciated PMI.  There is a soft S1 and S2.  No  gallop. Abdominal exam is protuberant.  Good bowel sounds.  No midline  bruit.  Extremities, no cyanosis, clubbing, or edema.  Pulses are brisk.  Neuro exam is intact.   ASSESSMENT AND PLAN:  Paroxysmal atrial fibrillation, which is fairly  symptomatic over the past 10 years or so.  His CHADS score is very low,  and I do not think he needs Coumadin.  He says he feels so well and he  is bradycardic off medications at present, we will use p.r.n. metoprolol  50 mg when he has bouts of atrial fib.  In addition, we will increase  his aspirin from 81-325.   I will plan on seeing him back in about 4-6 weeks to make sure he is  doing well.   I have advised him to follow up with urologist who saw him in the  hospital, he cannot remember the name, for hematuria.  I told him it  must be a source for him to bleed at all.  He could spontaneously bleed  if his INR was extremely high.     Thomas C. Verl Blalock, MD, [facility]  Electronically Signed    TCW/MedQ  DD: 06/20/2008  DT: 06/21/2008  Job #: [id number]

## 2011-03-18 NOTE — Assessment & Plan Note (Signed)
[facility] OFFICE NOTE   NAME:Emanuelson, SHOURYA MACPHERSON                  MRN:          [id number]  DATE:07/17/2008                            DOB:          [birth year]    Mr. Deterding returns today because of going back into atrial  fibrillation on Friday.  He started taking metoprolol tartrate 50 mg  twice a day, which he was only taking p.r.n.  He had a hard time  tolerating Cartia XT, but he says metoprolol is worse!  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   Ironically he forgot to take his metoprolol last night.   His blood pressure today is 102/70, his pulse is 111 and irregular.  EKG  confirms atrial fib.  His weight is 291.  He is in no acute distress.  His exam is otherwise unchanged.  Lungs are clear.  Heart reveals an  irregular rate and rhythm.  Extremities with no edema.   I have had a 20-minute discussion with Mr. Millea today about options  with his atrial fib.  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  He is  at low risk of a stroke and can stay on aspirin 325 a day.  He is  obviously not tolerating metoprolol.   PLAN:  1. Restart Cartia XT at 300 mg p.o. this evening.  2. Continue metoprolol 50 b.i.d. until tomorrow night.  3. Continue aspirin.  4. If he does not convert by Wednesday morning, 36 hours, we will      admit him to [facility] for intravenous anticoagulation, TEE      cardioversion, and to speak with Dr. Thompson Grayer about possible      ablation.  He says he wants this thing to go away completely.     Thomas C. Verl Blalock, MD, [facility]  Electronically Signed    TCW/MedQ  DD: 07/17/2008  DT: 07/18/2008  Job #: [id number]

## 2011-03-23 ENCOUNTER — Emergency Department (HOSPITAL_COMMUNITY): Payer: BC Managed Care – PPO

## 2011-03-23 ENCOUNTER — Emergency Department (HOSPITAL_COMMUNITY)
Admission: EM | Admit: 2011-03-23 | Discharge: 2011-03-23 | Disposition: A | Discharge status: Home / Self Care | Payer: BC Managed Care – PPO | Attending: Emergency Medicine | Admitting: Emergency Medicine

## 2011-03-23 DIAGNOSIS — K219 Gastro-esophageal reflux disease without esophagitis: Secondary | ICD-10-CM | POA: Insufficient documentation

## 2011-03-23 DIAGNOSIS — R079 Chest pain, unspecified: Secondary | ICD-10-CM | POA: Insufficient documentation

## 2011-03-23 DIAGNOSIS — Z7982 Long term (current) use of aspirin: Secondary | ICD-10-CM | POA: Insufficient documentation

## 2011-03-23 DIAGNOSIS — R61 Generalized hyperhidrosis: Secondary | ICD-10-CM | POA: Insufficient documentation

## 2011-03-23 DIAGNOSIS — I4891 Unspecified atrial fibrillation: Secondary | ICD-10-CM | POA: Insufficient documentation

## 2011-03-23 DIAGNOSIS — Z79899 Other long term (current) drug therapy: Secondary | ICD-10-CM | POA: Insufficient documentation

## 2011-03-23 DIAGNOSIS — F411 Generalized anxiety disorder: Secondary | ICD-10-CM | POA: Insufficient documentation

## 2011-03-23 DIAGNOSIS — M546 Pain in thoracic spine: Secondary | ICD-10-CM | POA: Insufficient documentation

## 2011-03-23 LAB — COMPREHENSIVE METABOLIC PANEL
Alkaline Phosphatase: 45 U/L (ref 39–117)
BUN: 11 mg/dL (ref 6–23)
Chloride: 104 mEq/L (ref 96–112)
Glucose, Bld: 89 mg/dL (ref 70–99)
Potassium: 3.9 mEq/L (ref 3.5–5.1)
Total Bilirubin: 0.2 mg/dL — ABNORMAL LOW (ref 0.3–1.2)

## 2011-03-23 LAB — CBC
MCV: 84.9 fL (ref 78.0–100.0)
Platelets: 191 10*3/uL (ref 150–400)
RDW: 13.3 % (ref 11.5–15.5)
WBC: 10.5 10*3/uL (ref 4.0–10.5)

## 2011-03-23 LAB — POCT CARDIAC MARKERS
CKMB, poc: <1 ng/mL — ABNORMAL LOW (ref 1.0–8.0)
Myoglobin, poc: 55.5 ng/mL (ref 12–200)

## 2011-03-24 ENCOUNTER — Telehealth: Payer: Self-pay | Admitting: Cardiology

## 2011-03-24 NOTE — Telephone Encounter (Signed)
Pt was seen in ED this past weekend. Pt says he was told to call and schedule a stress test.  After reading the ed physician report, I could only find where he was to follow-up in 1-2 days with cardiology. Appt. Made for 03/26/11 at 2:00pm confirmed appt with pt. Mylo Red RN

## 2011-03-24 NOTE — Telephone Encounter (Signed)
Pt seen in er this weekend for chest pain-told to have stress test-need order

## 2011-03-26 ENCOUNTER — Encounter: Payer: Self-pay | Admitting: *Deleted

## 2011-03-26 ENCOUNTER — Ambulatory Visit (INDEPENDENT_AMBULATORY_CARE_PROVIDER_SITE_OTHER): Payer: BC Managed Care – PPO | Admitting: Cardiology

## 2011-03-26 ENCOUNTER — Encounter: Payer: Self-pay | Admitting: Cardiology

## 2011-03-26 VITALS — BP 132/90 | HR 56 | Resp 18 | Ht 74.0 in | Wt 320.8 lb

## 2011-03-26 DIAGNOSIS — R0789 Other chest pain: Secondary | ICD-10-CM

## 2011-03-26 DIAGNOSIS — R079 Chest pain, unspecified: Secondary | ICD-10-CM

## 2011-03-26 LAB — APTT: aPTT: 29 s — ABNORMAL HIGH (ref 21.7–28.8)

## 2011-03-26 LAB — CBC WITH DIFFERENTIAL/PLATELET
Eosinophils Absolute: 0.1 10*3/uL (ref 0.0–0.7)
MCHC: 34.5 g/dL (ref 30.0–36.0)
MCV: 88 fl (ref 78.0–100.0)
Monocytes Absolute: 0.5 10*3/uL (ref 0.1–1.0)
Neutrophils Relative %: 65.1 % (ref 43.0–77.0)
Platelets: 230 10*3/uL (ref 150.0–400.0)
RDW: 14.2 % (ref 11.5–14.6)

## 2011-03-26 LAB — BASIC METABOLIC PANEL
BUN: 16 mg/dL (ref 6–23)
GFR: 101.22 mL/min (ref >60.00)
Potassium: 3.9 mEq/L (ref 3.5–5.1)
Sodium: 139 mEq/L (ref 135–145)

## 2011-03-26 LAB — PROTIME-INR: Prothrombin Time: 12.2 s (ref 10.2–12.4)

## 2011-03-26 MED ORDER — ALPRAZOLAM 1 MG PO TABS: 1.0000 mg | ORAL_TABLET | Freq: Every evening | ORAL | Status: AC | PRN

## 2011-03-26 MED ORDER — NITROGLYCERIN 0.4 MG SL SUBL: 0.4000 mg | SUBLINGUAL_TABLET | SUBLINGUAL | Status: DC | PRN

## 2011-03-26 NOTE — Patient Instructions (Signed)
Your physician has requested that you have a cardiac catheterization. Cardiac catheterization is used to diagnose and/or treat various heart conditions. Doctors may recommend this procedure for a number of different reasons. The most common reason is to evaluate chest pain. Chest pain can be a symptom of coronary artery disease (CAD), and cardiac catheterization can show whether plaque is narrowing or blocking your heart's arteries. This procedure is also used to evaluate the valves, as well as measure the blood flow and oxygen levels in different parts of your heart. For further information please visit [email or web address] Please follow instruction sheet, as given.  Your physician has recommended you make the following change in your medication: nitroglycerin tablets. Follow as directed for chest pressure ( angina) Your physician recommends that you schedule a follow-up appointment in: after your heart catheterization with Dr. Daleen Squibb.

## 2011-03-26 NOTE — Progress Notes (Signed)
   Patient ID: Brandon [name], male    DOB: 06-07-1966, 45 y.o.   MRN: [id number]  HPI  Brandon [name] comes in today for an episode of chest tightness that occurred 3 days ago while moving some tools from his truck. It went through to his back. He sat down and then began to ache in his shoulders and jaws. He later felt really bad and called EMS. EKG tracing x 2 and the [facility] ED tracings were unremarkable. POC, other blood work and CXR was normal. In ER he had CP and was give NTG wich dropped his BP requiring fluid resuscitation. He feels it helped his discomfort.   Since then he has just not felt well. He weedeated yesterday with no discomfort.    Review of Systems  All other systems reviewed and are negative.      Physical Exam  Nursing note and vitals reviewed. Constitutional: He is oriented to person, place, and time. He appears well-developed and well-nourished. No distress.  HENT:  Head: Normocephalic and atraumatic.  Eyes: EOM are normal. Pupils are equal, round, and reactive to light.  Neck: Neck supple. No JVD present. No tracheal deviation present. No thyromegaly present.  Cardiovascular: Normal rate, regular rhythm, normal heart sounds and intact distal pulses.   No murmur heard. Pulmonary/Chest: Effort normal and breath sounds normal.  Abdominal: Soft.  Musculoskeletal: Normal range of motion. He exhibits no edema.  Neurological: He is alert and oriented to person, place, and time.  Skin: Skin is warm and dry.  Psychiatric: He has a normal mood and affect.

## 2011-03-27 ENCOUNTER — Inpatient Hospital Stay (HOSPITAL_BASED_OUTPATIENT_CLINIC_OR_DEPARTMENT_OTHER)
Admission: RE | Admit: 2011-03-27 | Discharge: 2011-03-27 | Disposition: A | Discharge status: Home / Self Care | Payer: BC Managed Care – PPO | Source: Ambulatory Visit | Attending: Cardiology | Admitting: Cardiology

## 2011-03-27 DIAGNOSIS — R079 Chest pain, unspecified: Secondary | ICD-10-CM

## 2011-04-01 ENCOUNTER — Encounter: Payer: Self-pay | Admitting: Cardiology

## 2011-04-09 NOTE — Cardiovascular Report (Signed)
  NAME:  Brandon [name], MALLOY NO.:  [id number]  MEDICAL RECORD NO.:  [id number]           PATIENT TYPE:  LOCATION:                                 FACILITY:  PHYSICIAN:  Roshunda Keir M. Martinique, M.D.  DATE OF BIRTH:  Apr 11, 1966  DATE OF PROCEDURE:  03/27/2011 DATE OF DISCHARGE:                           CARDIAC CATHETERIZATION   INDICATIONS FOR PROCEDURE:  A 45 year old white male with morbid obesity who experienced symptoms of recent chest pain concerning for angina.  PROCEDURES:  Left heart catheterization, coronary and left ventricular angiography.  ACCESS:  Via the right femoral artery using standard Seldinger technique.  EQUIPMENTS:  4-French 4-cm right and left Judkins catheter, 4-French pigtail catheter, 4-French arterial sheath.  MEDICATIONS:  Local anesthesia with 1% Xylocaine, Versed 2 mg IV.  CONTRAST:  90 mL of Omnipaque.  HEMODYNAMIC DATA:  Aortic pressure was 109/67 with a mean of 86 mmHg. Left ventricular pressure is 106 with EDP of 12 mmHg.  ANGIOGRAPHIC DATA:  The left coronary artery arises and distributes normally.  The left main coronary is normal.  The left anterior descending artery is normal.  The left circumflex coronary artery is normal.  The right coronary artery is normal.  Left ventricular angiography performed in the RAO view demonstrates normal left ventricular size and contractility with overall ejection fraction of 55%.  There is no significant mitral insufficiency or prolapse.  FINAL INTERPRETATION: 1. Normal coronary anatomy. 2. Normal left ventricular function.          ______________________________ Alleigh Mollica M. Martinique, M.D.     PMJ/MEDQ  D:  03/27/2011  T:  03/28/2011  Job:  [id number]  cc:   Thomas C. Verl Blalock, MD, [facility] Kathryne Eriksson, MD  Electronically Signed by Gareth Fitzner Martinique M.D. on 04/09/2011 06:04:48 PM

## 2011-04-11 ENCOUNTER — Encounter: Payer: Self-pay | Admitting: Cardiology

## 2011-04-11 ENCOUNTER — Ambulatory Visit (INDEPENDENT_AMBULATORY_CARE_PROVIDER_SITE_OTHER): Payer: BC Managed Care – PPO | Admitting: Cardiology

## 2011-04-11 VITALS — BP 130/86 | HR 74 | Ht 75.0 in | Wt 323.0 lb

## 2011-04-11 DIAGNOSIS — R079 Chest pain, unspecified: Secondary | ICD-10-CM

## 2011-04-11 MED ORDER — SILDENAFIL CITRATE 50 MG PO TABS: 50.0000 mg | ORAL_TABLET | Freq: Every day | ORAL | Status: DC | PRN

## 2011-04-11 NOTE — Patient Instructions (Signed)
Your physician recommends that you schedule a follow-up appointment in: 6 months with Dr. Wall  

## 2011-04-11 NOTE — Assessment & Plan Note (Signed)
Reassurance 

## 2011-04-11 NOTE — Progress Notes (Signed)
HPI Brandon [name] comes in today for followup of his chest pain. It sounded like classic angina.  His cardiac catheterization however was normal. We're delighted.  He's having problems with erectile dysfunction. There is no obvious cause other than stress. I've recommended Viagra. Past Medical History  Diagnosis Date  . DIVERTICULITIS, COLON 06/07/2009  . DEPRESSION 06/07/2009  . CHICKENPOX, HX OF 06/07/2009  . ARTHRITIS 06/07/2009  . ATRIAL FIBRILLATION, PAROXYSMAL 06/07/2009  . Hematuria     No past surgical history on file.  Family History  Problem Relation Age of Onset  . Arthritis    . Diabetes      1st degree relative  . Stroke      F 1st degree relative <60; M 1st degree relative <50    History   Social History  . Marital Status: Single    Spouse Name: N/A    Number of Children: N/A  . Years of Education: N/A   Occupational History  . self employed    Social History Main Topics  . Smoking status: Never Smoker   . Smokeless tobacco: Current User    Types: Chew  . Alcohol Use: Yes     rare  . Drug Use: No  . Sexually Active: Not on file   Other Topics Concern  . Not on file   Social History Narrative   Does not do regular exercise. Former dip - quit about 1 year ago.     Allergies  Allergen Reactions  . Penicillins     Current Outpatient Prescriptions  Medication Sig Dispense Refill  . ALPRAZolam (XANAX) 1 MG tablet Take 1 tablet (1 mg total) by mouth at bedtime as needed for sleep.  30 tablet  1  . aspirin 325 MG tablet Take 325 mg by mouth daily.        Marland Kitchen diltiazem (CARDIZEM CD) 240 MG 24 hr capsule TAKE ONE CAPSULE BY MOUTH EVERY DAY  90 capsule  1  . flecainide (TAMBOCOR) 100 MG tablet Take 100 mg by mouth 2 (two) times daily.        . furosemide (LASIX) 20 MG tablet Take 20 mg by mouth as needed.        . nitroGLYCERIN (NITROSTAT) 0.4 MG SL tablet Place 1 tablet (0.4 mg total) under the tongue every 5 (five) minutes as needed for chest pain.  25 tablet   12  . omeprazole (PRILOSEC) 20 MG capsule 1 tab M, W, F       . sildenafil (VIAGRA) 50 MG tablet Take 1 tablet (50 mg total) by mouth daily as needed for erectile dysfunction (1 hour prior).  5 tablet  1    ROS Negative other than HPI.   PE General Appearance: well developed, well nourished in no acute distress HEENT: symmetrical face, PERRLA, good dentition  Neck: no JVD, thyromegaly, or adenopathy, trachea midline Chest: symmetric without deformity Cardiac: PMI non-displaced, RRR, normal S1, S2, no gallop or murmur Lung: clear to ausculation and percussion Vascular: all pulses full without bruits  Abdominal: nondistended, nontender, good bowel sounds, no HSM, no bruits Extremities: no cyanosis, clubbing or edema, no sign of DVT, no varicosities  Skin: normal color, no rashes Neuro: alert and oriented x 3, non-focal Pysch: normal affect Filed Vitals:   04/11/11 1005  BP: 130/86  Pulse: 74  Height: 6\' 3"  (1.905 m)  Weight: 323 lb (146.512 kg)    EKG  Labs and Studies Reviewed.   Lab Results  Component Value Date  WBC 8.1 03/26/2011   HGB 15.7 03/26/2011   HCT 45.4 03/26/2011   MCV 88.0 03/26/2011   PLT 230.0 03/26/2011      Chemistry      Component Value Date/Time   NA 139 03/26/2011 1440   K 3.9 03/26/2011 1440   CL 104 03/26/2011 1440   CO2 28 03/26/2011 1440   BUN 16 03/26/2011 1440   CREATININE 0.9 03/26/2011 1440      Component Value Date/Time   CALCIUM 9.0 03/26/2011 1440   ALKPHOS 45 03/23/2011 1546   AST 19 03/23/2011 1546   ALT 26 03/23/2011 1546   BILITOT 0.2* 03/23/2011 1546       Lab Results  Component Value Date   CHOL 152 07/31/2010   CHOL 141 06/07/2009   Lab Results  Component Value Date   HDL 28.30* 07/31/2010   HDL 28.20* 06/07/2009   Lab Results  Component Value Date   LDLCALC 97 07/31/2010   LDLCALC 102* 06/07/2009   Lab Results  Component Value Date   TRIG 133.0 07/31/2010   TRIG 53.0 06/07/2009   Lab Results  Component Value Date    CHOLHDL 5 07/31/2010   CHOLHDL 5 06/07/2009   No results found for this basename: HGBA1C   Lab Results  Component Value Date   ALT 26 03/23/2011   AST 19 03/23/2011   ALKPHOS 45 03/23/2011   BILITOT 0.2* 03/23/2011   No results found for this basename: TSH

## 2011-04-13 ENCOUNTER — Other Ambulatory Visit: Payer: Self-pay | Admitting: Cardiology

## 2011-06-01 ENCOUNTER — Other Ambulatory Visit: Payer: Self-pay | Admitting: Cardiology

## 2011-06-09 ENCOUNTER — Ambulatory Visit (INDEPENDENT_AMBULATORY_CARE_PROVIDER_SITE_OTHER): Payer: BC Managed Care – PPO | Admitting: Family Medicine

## 2011-06-09 ENCOUNTER — Encounter: Payer: Self-pay | Admitting: Family Medicine

## 2011-06-09 VITALS — BP 120/70 | HR 58 | Temp 98.0°F | Ht 72.0 in | Wt 321.8 lb

## 2011-06-09 DIAGNOSIS — N529 Male erectile dysfunction, unspecified: Secondary | ICD-10-CM | POA: Insufficient documentation

## 2011-06-09 DIAGNOSIS — N419 Inflammatory disease of prostate, unspecified: Secondary | ICD-10-CM

## 2011-06-09 MED ORDER — SILDENAFIL CITRATE 100 MG PO TABS: 100.0000 mg | ORAL_TABLET | ORAL | Status: DC | PRN

## 2011-06-09 MED ORDER — SULFAMETHOXAZOLE-TMP DS 800-160 MG PO TABS: 1.0000 | ORAL_TABLET | Freq: Two times a day (BID) | ORAL | Status: AC

## 2011-06-09 NOTE — Progress Notes (Signed)
  Subjective:    Patient ID: Brandon [name], male    DOB: 26-Dec-1965, 45 y.o.   MRN: [id number]  HPI  Prostatitis: Hurting between his front and theback of his abdomen. When he urinates - no pain, but it will hurt some. Feels like he has to force his urine out.  Hurts bad after sex. Pain in the area underneath his testicles "but inside" No discharge No STD concern No dysuria  ED: trial of Viagra 50 mg +/-. Occ ED. Not bothering him so much now  The PMH, PSH, Social History, Family History, Medications, and allergies have been reviewed in [facility], and have been updated if relevant.  Review of Systems ROS: GEN: No acute illnesses, no fevers, chills. GI: No n/v/d, eating normally Pulm: No SOB Interactive and getting along well at home.  Otherwise, ROS is as per the HPI.     Objective:   Physical Exam   Physical Exam  Blood pressure 120/70, pulse 58, temperature 98 F (36.7 C), temperature source Oral, height 6' (1.829 m), weight 321 lb 12.8 oz (145.968 kg), SpO2 96.00%.  GEN: WDWN, NAD, Non-toxic, A & O x 3 HEENT: Atraumatic, Normocephalic. Neck supple. No masses, No LAD. Ears and Nose: No external deformity. EXTR: No c/c/e NEURO Normal gait.  PSYCH: Normally interactive. Conversant. Not depressed or anxious appearing.  Calm demeanor.  GU: norm circ males, testicles NT. Prostate not enlarged. Not hot. Non-tender     Assessment & Plan:   1. Prostatitis   2. Erectile dysfunction    1. ABX, septra. Call if still sx after 2 weeks therapy 2. Trial of viagra 100 mg prn

## 2011-06-25 ENCOUNTER — Encounter: Payer: Self-pay | Admitting: Family Medicine

## 2011-06-25 ENCOUNTER — Ambulatory Visit (INDEPENDENT_AMBULATORY_CARE_PROVIDER_SITE_OTHER): Payer: BC Managed Care – PPO | Admitting: Family Medicine

## 2011-06-25 VITALS — BP 120/70 | HR 53 | Temp 98.5°F | Ht 74.0 in | Wt 321.8 lb

## 2011-06-25 DIAGNOSIS — R109 Unspecified abdominal pain: Secondary | ICD-10-CM

## 2011-06-25 DIAGNOSIS — C4491 Basal cell carcinoma of skin, unspecified: Secondary | ICD-10-CM

## 2011-06-25 DIAGNOSIS — N411 Chronic prostatitis: Secondary | ICD-10-CM

## 2011-06-25 DIAGNOSIS — R310 Gross hematuria: Secondary | ICD-10-CM

## 2011-06-25 LAB — POCT URINALYSIS DIPSTICK
Ketones, UA: NEGATIVE
Leukocytes, UA: NEGATIVE
Protein, UA: NEGATIVE
pH, UA: 6

## 2011-06-25 NOTE — Progress Notes (Signed)
Subjective:    Patient ID: Brandon [name], male    DOB: [birth year], 45 y.o.   MRN: [id number]  HPI  Brandon [name], a 45 y.o. male presents today in the office for the following:    F/u with continued abdominal pain and pain after having intercourse. On last OV, clinically, most sounded like prostatitis, though prostate NT on exam, so placed on 14 d course of septra. Gross abd pain has improved, but continues with pain post intercourse.  Will hurt for 2 or 3 days after ejaculation. Has been hurting since last Thursday. After ejaculation. (6 days)  H/o hematuria, gross 3 or 4 years ago.  Happened 1 time again later Saw Dr. Wanda Plump x 1 - did not do cystoscopy  Also c/o left leg pain and foot pain  BCC, left neck, has been there for a few months  06/09/2011 OV Prostatitis: Hurting between his front and theback of his abdomen. When he urinates - no pain, but it will hurt some. Feels like he has to force his urine out.  Hurts bad after sex. Pain in the area underneath his testicles "but inside" No discharge No STD concern No dysuria  ED: trial of Viagra 50 mg +/-. Occ ED. Not bothering him so much now  Patient Active Problem List  Diagnoses  . HYPERLIPIDEMIA  . OBESITY  . DEPRESSION  . ATRIAL FIBRILLATION, PAROXYSMAL  . DIVERTICULITIS, COLON  . ARTHRITIS  . CHICKENPOX, HX OF  . Erectile dysfunction  . Gross hematuria  . Basal cell carcinoma   Past Medical History  Diagnosis Date  . DIVERTICULITIS, COLON 06/07/2009  . DEPRESSION 06/07/2009  . CHICKENPOX, HX OF 06/07/2009  . ARTHRITIS 06/07/2009  . ATRIAL FIBRILLATION, PAROXYSMAL 06/07/2009  . Hematuria    No past surgical history on file. History  Substance Use Topics  . Smoking status: Never Smoker   . Smokeless tobacco: Current User    Types: Chew  . Alcohol Use: Yes     rare   Family History  Problem Relation Age of Onset  . Arthritis    . Diabetes      1st degree relative  . Stroke      F 1st degree  relative <60; M 1st degree relative <50   Allergies  Allergen Reactions  . Penicillins    Current Outpatient Prescriptions on File Prior to Visit  Medication Sig Dispense Refill  . aspirin 325 MG tablet Take 325 mg by mouth daily.        Marland Kitchen diltiazem (CARDIZEM CD) 240 MG 24 hr capsule TAKE ONE CAPSULE BY MOUTH EVERY DAY  90 capsule  1  . flecainide (TAMBOCOR) 100 MG tablet TAKE ONE TABLET BY MOUTH EVERY 12 HOURS  60 tablet  11  . furosemide (LASIX) 20 MG tablet TAKE ONE TABLET BY MOUTH IN THE MORNING AS NEEDED FOR SWELLING.  30 tablet  12  . nitroGLYCERIN (NITROSTAT) 0.4 MG SL tablet Place 1 tablet (0.4 mg total) under the tongue every 5 (five) minutes as needed for chest pain.  25 tablet  12  . omeprazole (PRILOSEC) 20 MG capsule 1 tab M, W, F       . sildenafil (VIAGRA) 100 MG tablet Take 1 tablet (100 mg total) by mouth as needed for erectile dysfunction.  4 tablet  11     Review of Systems ROS: GEN: Acute illness details above GI: Tolerating PO intake GU: maintaining adequate hydration and urination Pulm: No SOB Interactive and getting along  well at home.  Otherwise, ROS is as per the HPI.     Objective:   Physical Exam   Physical Exam  Blood pressure 120/70, pulse 53, temperature 98.5 F (36.9 C), temperature source Oral, height 6\' 2"  (1.88 m), weight 321 lb 12.8 oz (145.968 kg), SpO2 98.00%.  GEN: WDWN, NAD, Non-toxic, A & O x 3 HEENT: Atraumatic, Normocephalic. Neck supple. No masses, No LAD. Ears and Nose: No external deformity. CV: RRR, No M/G/R. No JVD. No thrill. No extra heart sounds. PULM: CTA B, no wheezes, crackles, rhonchi. No retractions. No resp. distress. No accessory muscle use. ABD: S, NT, ND, +BS. No rebound tenderness. No HSM.  EXTR: No c/c/e NEURO Normal gait.  PSYCH: Normally interactive. Conversant. Not depressed or anxious appearing.  Calm demeanor.        Assessment & Plan:   1. Chronic prostatitis : I think needs further eval. May be  chronic prostatitis, may have pelvic musculature dysfunction.  Ambulatory referral to Urology  2. Gross hematuria : with gross hematuria, recommended eval cystoscopy to fully eval and rule out potential carcinoma Ambulatory referral to Urology, POCT Urinalysis Dipstick  3. Basal cell carcinoma : will need elliptical bx - f/u for 30 minute OV   4. Abdominal  pain, other specified site

## 2011-06-25 NOTE — Patient Instructions (Signed)
F/u for a 30 minute procedure appointment

## 2011-07-14 ENCOUNTER — Encounter: Payer: Self-pay | Admitting: Family Medicine

## 2011-07-14 ENCOUNTER — Ambulatory Visit (INDEPENDENT_AMBULATORY_CARE_PROVIDER_SITE_OTHER): Payer: BC Managed Care – PPO | Admitting: Family Medicine

## 2011-07-14 VITALS — HR 76 | Temp 97.6°F | Wt 324.1 lb

## 2011-07-14 DIAGNOSIS — Z23 Encounter for immunization: Secondary | ICD-10-CM

## 2011-07-14 DIAGNOSIS — L989 Disorder of the skin and subcutaneous tissue, unspecified: Secondary | ICD-10-CM | POA: Insufficient documentation

## 2011-07-14 DIAGNOSIS — D234 Other benign neoplasm of skin of scalp and neck: Secondary | ICD-10-CM

## 2011-07-14 NOTE — Progress Notes (Signed)
  Subjective:    Patient ID: Brandon [name], male    DOB: June 27, 1966, 45 y.o.   MRN: [id number]  HPI  Procedure only  Review of Systems     Objective:   Physical Exam        Assessment & Plan:   Skin Lesion  Ellipitical Biopsy, L base of neck Anesth: 1% lidocaine with epi. 8 cc Verbal and written consent obtained. Re: concern for bcc vs ak vs scc 8 mm lesion Marked area. Elliptical incision around area with total length of 1.2 cm. Margins appear clear. Elliptical biopsy taken without difficulty using #15 blade. Close with #3 sutures of Ethilon-4. No significant bleeding. No complications.  Send to path.  Path has returned: benign

## 2011-07-25 ENCOUNTER — Encounter: Payer: Self-pay | Admitting: Family Medicine

## 2011-07-25 ENCOUNTER — Ambulatory Visit (INDEPENDENT_AMBULATORY_CARE_PROVIDER_SITE_OTHER): Payer: BC Managed Care – PPO | Admitting: Family Medicine

## 2011-07-25 ENCOUNTER — Ambulatory Visit: Payer: BC Managed Care – PPO | Admitting: Family Medicine

## 2011-07-25 ENCOUNTER — Telehealth: Payer: Self-pay | Admitting: Cardiology

## 2011-07-25 VITALS — BP 110/80 | HR 63 | Temp 98.4°F | Wt 322.0 lb

## 2011-07-25 DIAGNOSIS — Z4802 Encounter for removal of sutures: Secondary | ICD-10-CM

## 2011-07-25 MED ORDER — FLECAINIDE ACETATE 100 MG PO TABS: 100.0000 mg | ORAL_TABLET | Freq: Two times a day (BID) | ORAL | Status: DC

## 2011-07-25 NOTE — Telephone Encounter (Signed)
Pt calling needing refill of flecainide. Pt pharmacy said pt needing to come see Dr. Daleen Squibb before getting a refill and pt wanted to check and see if this is correct; if he needs to come in to see Dr. Daleen Squibb to get refill or if we can call it in. Please return pt call to discuss further.

## 2011-07-25 NOTE — Telephone Encounter (Signed)
Pt aware prescription called in  Mylo Red RN

## 2011-07-25 NOTE — Progress Notes (Signed)
  Subjective:    Patient ID: Brandon [name], male    DOB: [birth year], 45 y.o.   MRN: [id number]  HPI  45 year old male here for removal of sutures from elliptical excision at base of neck. 07/14/2011  Pathology returned benign.  Area appears well healed, no concerns of infection at this time. No discharge, no fever, no pain.   Review of Systems     Objective:   Physical Exam  Well healed 1 cm scar. No erythema, No discharge.       Assessment & Plan:  Suture removal without complication, no bleeding.  3 sutures removed. No steri strips needed.

## 2011-08-04 ENCOUNTER — Encounter: Payer: Self-pay | Admitting: Family Medicine

## 2011-08-24 ENCOUNTER — Other Ambulatory Visit: Payer: Self-pay | Admitting: Cardiology

## 2011-09-01 ENCOUNTER — Other Ambulatory Visit: Payer: Self-pay | Admitting: *Deleted

## 2011-09-01 MED ORDER — DILTIAZEM HCL ER COATED BEADS 240 MG PO CP24: 240.0000 mg | ORAL_CAPSULE | Freq: Every day | ORAL | Status: DC

## 2011-10-09 ENCOUNTER — Ambulatory Visit (INDEPENDENT_AMBULATORY_CARE_PROVIDER_SITE_OTHER): Payer: BC Managed Care – PPO | Admitting: Cardiology

## 2011-10-09 ENCOUNTER — Encounter: Payer: Self-pay | Admitting: Cardiology

## 2011-10-09 VITALS — BP 118/78 | HR 53 | Ht 74.0 in | Wt 316.0 lb

## 2011-10-09 DIAGNOSIS — I4891 Unspecified atrial fibrillation: Secondary | ICD-10-CM

## 2011-10-09 DIAGNOSIS — E785 Hyperlipidemia, unspecified: Secondary | ICD-10-CM

## 2011-10-09 DIAGNOSIS — E669 Obesity, unspecified: Secondary | ICD-10-CM

## 2011-10-09 MED ORDER — FLECAINIDE ACETATE 100 MG PO TABS: 100.0000 mg | ORAL_TABLET | Freq: Two times a day (BID) | ORAL | Status: DC

## 2011-10-09 MED ORDER — DILTIAZEM HCL ER COATED BEADS 240 MG PO CP24: 240.0000 mg | ORAL_CAPSULE | Freq: Every day | ORAL | Status: DC

## 2011-10-09 NOTE — Progress Notes (Signed)
HPI [name] comes in today for followup of his history of paroxysmal A. Fib. He is normal left ventricular function and normal coronary arteries.  He's had no episodes of clinical A. Fib. He is compliant with his medications. He has had no symptoms of TIAs or mini strokes.  He denies orthopnea, PND or edema.  Past Medical History  Diagnosis Date  . DIVERTICULITIS, COLON 06/07/2009  . DEPRESSION 06/07/2009  . CHICKENPOX, HX OF 06/07/2009  . ARTHRITIS 06/07/2009  . ATRIAL FIBRILLATION, PAROXYSMAL 06/07/2009  . Hematuria     Current Outpatient Prescriptions  Medication Sig Dispense Refill  . ALPRAZolam (XANAX) 1 MG tablet       . aspirin 325 MG tablet Take 325 mg by mouth daily.        Marland Kitchen diltiazem (CARDIZEM CD) 240 MG 24 hr capsule Take 1 capsule (240 mg total) by mouth daily.  90 capsule  1  . flecainide (TAMBOCOR) 100 MG tablet Take 1 tablet (100 mg total) by mouth 2 (two) times daily.  180 tablet  3  . furosemide (LASIX) 20 MG tablet TAKE ONE TABLET BY MOUTH IN THE MORNING AS NEEDED FOR SWELLING.  30 tablet  12  . nitroGLYCERIN (NITROSTAT) 0.4 MG SL tablet Place 1 tablet (0.4 mg total) under the tongue every 5 (five) minutes as needed for chest pain.  25 tablet  12  . omeprazole (PRILOSEC) 20 MG capsule 1 tab M, W, F       . VIAGRA 100 MG tablet         Allergies  Allergen Reactions  . Penicillins     Family History  Problem Relation Age of Onset  . Arthritis    . Diabetes      1st degree relative  . Stroke      F 1st degree relative <60; M 1st degree relative <50    History   Social History  . Marital Status: Single    Spouse Name: N/A    Number of Children: N/A  . Years of Education: N/A   Occupational History  . self employed    Social History Main Topics  . Smoking status: Never Smoker   . Smokeless tobacco: Current User    Types: Chew   Comment: occasionally  . Alcohol Use: Yes     rare  . Drug Use: No  . Sexually Active: Not on file   Other Topics  Concern  . Not on file   Social History Narrative   Does not do regular exercise. Former dip - quit about 1 year ago.     ROS ALL NEGATIVE EXCEPT THOSE NOTED IN HPI  PE  General Appearance: well developed, well nourished in no acute distress, big and muscular, basically lost about 25 pounds. HEENT: symmetrical face, PERRLA, good dentition  Neck: no JVD, thyromegaly, or adenopathy, trachea midline Chest: symmetric without deformity Cardiac: PMI non-displaced, RRR, normal S1, S2, no gallop or murmur Lung: clear to ausculation and percussion Vascular: all pulses full without bruits  Abdominal: nondistended, nontender, good bowel sounds, no HSM, no bruits Extremities: no cyanosis, clubbing or edema, no sign of DVT, no varicosities  Skin: normal color, no rashes Neuro: alert and oriented x 3, non-focal Pysch: normal affect  EKG Sinus bradycardia, otherwise normal EKG BMET    Component Value Date/Time   NA 139 03/26/2011 1440   K 3.9 03/26/2011 1440   CL 104 03/26/2011 1440   CO2 28 03/26/2011 1440   GLUCOSE 95 03/26/2011  1440   BUN 16 03/26/2011 1440   CREATININE 0.9 03/26/2011 1440   CALCIUM 9.0 03/26/2011 1440   GFRNONAA >60 03/23/2011 1546   GFRAA  Value: >60        The eGFR has been calculated using the MDRD equation. This calculation has not been validated in all clinical situations. eGFR's persistently <60 mL/min signify possible Chronic Kidney Disease. 03/23/2011 1546    Lipid Panel     Component Value Date/Time   CHOL 152 07/31/2010 1638   TRIG 133.0 07/31/2010 1638   HDL 28.30* 07/31/2010 1638   CHOLHDL 5 07/31/2010 1638   VLDL 26.6 07/31/2010 1638   LDLCALC 97 07/31/2010 1638    CBC    Component Value Date/Time   WBC 8.1 03/26/2011 1440   RBC 5.16 03/26/2011 1440   HGB 15.7 03/26/2011 1440   HCT 45.4 03/26/2011 1440   PLT 230.0 03/26/2011 1440   MCV 88.0 03/26/2011 1440   MCH 29.5 03/23/2011 1546   MCHC 34.5 03/26/2011 1440   RDW 14.2 03/26/2011 1440   LYMPHSABS 2.2  03/26/2011 1440   MONOABS 0.5 03/26/2011 1440   EOSABS 0.1 03/26/2011 1440   BASOSABS 0.0 03/26/2011 1440

## 2011-10-09 NOTE — Assessment & Plan Note (Signed)
No clinical recurrence. The change in treatment. Clinic followup in 6 months.

## 2011-10-09 NOTE — Patient Instructions (Signed)
Your physician recommends that you schedule a follow-up appointment with Dr. Wall in 6 months.  

## 2011-10-09 NOTE — Assessment & Plan Note (Signed)
He has lost considerable weight. He is just a big and and muscular man

## 2011-12-01 ENCOUNTER — Telehealth: Payer: Self-pay | Admitting: Cardiology

## 2011-12-01 NOTE — Telephone Encounter (Signed)
Talked with pt and with his pharmacy.  The Flecainide was sent in at last ov (12/6). It was filled 1/25.  Pt wasn't sure it was the same medication. Reassured that it was and nothing had changed. Mylo Red RN

## 2011-12-01 NOTE — Telephone Encounter (Signed)
New Problem:     Patient had his flecainide (TAMBOCOR) 100 MG tablet changed and is wondering why. Please call back.

## 2012-01-05 ENCOUNTER — Other Ambulatory Visit (INDEPENDENT_AMBULATORY_CARE_PROVIDER_SITE_OTHER): Payer: BC Managed Care – PPO

## 2012-01-05 DIAGNOSIS — E785 Hyperlipidemia, unspecified: Secondary | ICD-10-CM

## 2012-01-05 DIAGNOSIS — Z79899 Other long term (current) drug therapy: Secondary | ICD-10-CM

## 2012-01-05 DIAGNOSIS — Z125 Encounter for screening for malignant neoplasm of prostate: Secondary | ICD-10-CM

## 2012-01-05 LAB — CBC WITH DIFFERENTIAL/PLATELET
Basophils Relative: 0.2 % (ref 0.0–3.0)
Eosinophils Relative: 1.1 % (ref 0.0–5.0)
Lymphocytes Relative: 23.7 % (ref 12.0–46.0)
MCV: 88.3 fl (ref 78.0–100.0)
Monocytes Relative: 6.7 % (ref 3.0–12.0)
Neutrophils Relative %: 68.3 % (ref 43.0–77.0)
RBC: 5.25 Mil/uL (ref 4.22–5.81)
WBC: 10.6 10*3/uL — ABNORMAL HIGH (ref 4.5–10.5)

## 2012-01-05 LAB — BASIC METABOLIC PANEL
BUN: 11 mg/dL (ref 6–23)
CO2: 29 mEq/L (ref 19–32)
Chloride: 105 mEq/L (ref 96–112)
Glucose, Bld: 80 mg/dL (ref 70–99)
Potassium: 4.2 mEq/L (ref 3.5–5.1)
Sodium: 138 mEq/L (ref 135–145)

## 2012-01-05 LAB — HEPATIC FUNCTION PANEL
ALT: 22 U/L (ref 0–53)
AST: 15 U/L (ref 0–37)
Albumin: 3.5 g/dL (ref 3.5–5.2)
Alkaline Phosphatase: 55 U/L (ref 39–117)

## 2012-01-05 LAB — LIPID PANEL
Total CHOL/HDL Ratio: 4
VLDL: 11.4 mg/dL (ref 0.0–40.0)

## 2012-01-05 LAB — PSA: PSA: 0.64 ng/mL (ref 0.10–4.00)

## 2012-01-07 ENCOUNTER — Ambulatory Visit (INDEPENDENT_AMBULATORY_CARE_PROVIDER_SITE_OTHER): Payer: BC Managed Care – PPO | Admitting: Family Medicine

## 2012-01-07 ENCOUNTER — Encounter: Payer: Self-pay | Admitting: Family Medicine

## 2012-01-07 VITALS — BP 122/82 | HR 57 | Temp 97.6°F | Ht 74.0 in | Wt 321.4 lb

## 2012-01-07 DIAGNOSIS — Z0000 Encounter for general adult medical examination without abnormal findings: Secondary | ICD-10-CM

## 2012-01-07 NOTE — Progress Notes (Signed)
Patient Name: Brandon [name] Date of Birth: 26-Mar-1966 Medical Record Number: [id number] Gender: male Date of Encounter: 01/07/2012  History of Present Illness:  Brandon [name] is a 46 y.o. very pleasant male patient who presents with the following:  Lost over 50 pounds  Over the summer, thought he ws having a hard attack and had no cardiac damage.  Clean cath  Preventative Health Maintenance Visit:  Health Maintenance Summary Reviewed and updated, unless pt declines services.  Tobacco History Reviewed. Alcohol: No concerns, no excessive use Exercise Habits: Some activity, rec at least 30 mins 5 times a week STD concerns: no risk or activity to increase risk Drug Use: None Encouraged self-testicular check  Health Maintenance  Topic Date Due  . Influenza Vaccine  08/03/2012  . Tetanus/tdap  12/04/2020    Labs reviewed with the patient.   Lipids:    Component Value Date/Time   CHOL 116 01/05/2012 1041   TRIG 57.0 01/05/2012 1041   HDL 32.10* 01/05/2012 1041   VLDL 11.4 01/05/2012 1041   CHOLHDL 4 01/05/2012 1041    CBC:    Component Value Date/Time   WBC 10.6* 01/05/2012 1041   HGB 15.6 01/05/2012 1041   HCT 46.4 01/05/2012 1041   PLT 214.0 01/05/2012 1041   MCV 88.3 01/05/2012 1041   NEUTROABS 7.3 01/05/2012 1041   LYMPHSABS 2.5 01/05/2012 1041   MONOABS 0.7 01/05/2012 1041   EOSABS 0.1 01/05/2012 1041   BASOSABS 0.0 01/05/2012 6010    Basic Metabolic Panel:    Component Value Date/Time   NA 138 01/05/2012 1041   K 4.2 01/05/2012 1041   CL 105 01/05/2012 1041   CO2 29 01/05/2012 1041   BUN 11 01/05/2012 1041   CREATININE 0.8 01/05/2012 1041   GLUCOSE 80 01/05/2012 1041   CALCIUM 8.9 01/05/2012 1041    Lab Results  Component Value Date   ALT 22 01/05/2012   AST 15 01/05/2012   ALKPHOS 55 01/05/2012   BILITOT 0.4 01/05/2012    Lab Results  Component Value Date   PSA 0.64 01/05/2012   PSA 0.49 11/29/2010     Patient Active Problem List  Diagnoses  . HYPERLIPIDEMIA  . OBESITY  .  DEPRESSION  . ATRIAL FIBRILLATION, PAROXYSMAL  . DIVERTICULITIS, COLON  . ARTHRITIS  . CHICKENPOX, HX OF  . Erectile dysfunction  . Gross hematuria  . Basal cell carcinoma   Past Medical History  Diagnosis Date  . DIVERTICULITIS, COLON 06/07/2009  . DEPRESSION 06/07/2009  . CHICKENPOX, HX OF 06/07/2009  . ARTHRITIS 06/07/2009  . ATRIAL FIBRILLATION, PAROXYSMAL 06/07/2009  . Hematuria    No past surgical history on file. History  Substance Use Topics  . Smoking status: Never Smoker   . Smokeless tobacco: Current User    Types: Chew   Comment: occasionally  . Alcohol Use: Yes     rare   Family History  Problem Relation Age of Onset  . Arthritis    . Diabetes      1st degree relative  . Stroke      F 1st degree relative <60; M 1st degree relative <50   Allergies  Allergen Reactions  . Penicillins     Medication list has been reviewed and updated.  Review of Systems:  General: Denies fever, chills, sweats. No significant weight loss. Eyes: Denies blurring,significant itching ENT: Denies earache, sore throat, and hoarseness. Cardiovascular: Denies chest pains, palpitations, dyspnea on exertion Respiratory: Denies cough, dyspnea at rest,wheeezing Breast: no concerns  about lumps GI: Denies nausea, vomiting, diarrhea, constipation, change in bowel habits, abdominal pain, melena, hematochezia GU: Denies penile discharge, ED, urinary flow / outflow problems. No STD concerns. Musculoskeletal: Denies back pain, joint pain Derm: Denies rash, itching Neuro: Denies  paresthesias, frequent falls, frequent headaches Psych: Denies depression, anxiety Endocrine: Denies cold intolerance, heat intolerance, polydipsia Heme: Denies enlarged lymph nodes Allergy: No hayfever   Physical Examination: Filed Vitals:   01/07/12 1440  BP: 122/82  Pulse: 57  Temp: 97.6 F (36.4 C)  TempSrc: Oral  Height: 6' 2"  (1.88 m)  Weight: 321 lb 6.4 oz (145.786 kg)  SpO2: 96%    Body mass index  is 41.27 kg/(m^2).   Wt Readings from Last 3 Encounters:  01/07/12 321 lb 6.4 oz (145.786 kg)  10/09/11 316 lb (143.337 kg)  07/25/11 322 lb (146.058 kg)    GEN: well developed, well nourished, no acute distress Eyes: conjunctiva and lids normal, PERRLA, EOMI ENT: TM clear, nares clear, oral exam WNL Neck: supple, no lymphadenopathy, no thyromegaly, no JVD Pulm: clear to auscultation and percussion, respiratory effort normal CV: regular rate and rhythm, S1-S2, no murmur, rub or gallop, no bruits, peripheral pulses normal and symmetric, no cyanosis, clubbing, edema or varicosities Chest: no scars, masses GI: soft, non-tender; no hepatosplenomegaly, masses; active bowel sounds all quadrants GU: no hernia, testicular mass, penile discharge, or prostate enlargement Lymph: no cervical, axillary or inguinal adenopathy MSK: gait normal, muscle tone and strength WNL, no joint swelling, effusions, discoloration, crepitus  SKIN: clear, good turgor, color WNL, no rashes, lesions, or ulcerations Neuro: normal mental status, normal strength, sensation, and motion Psych: alert; oriented to person, place and time, normally interactive and not anxious or depressed in appearance.   Assessment and Plan: Health Maint:  The patient's preventative maintenance and recommended screening tests for an annual wellness exam were reviewed in full today. Brought up to date unless services declined.  Counselled on the importance of diet, exercise, and its role in overall health and mortality. The patient's FH and SH was reviewed, including their home life, tobacco status, and drug and alcohol status.

## 2012-02-23 ENCOUNTER — Telehealth: Payer: Self-pay | Admitting: Cardiology

## 2012-02-23 MED ORDER — DILTIAZEM HCL ER BEADS 180 MG PO CP24: 180.0000 mg | ORAL_CAPSULE | Freq: Every day | ORAL | Status: DC

## 2012-02-23 NOTE — Telephone Encounter (Signed)
New msg Pt wants to talk you about his heart rate he said it has been 48. He hasnt been dizzy or having chest pain. He said he has just been tired. Please call

## 2012-02-23 NOTE — Telephone Encounter (Signed)
Pt calls today b/c he has been feeling more tired lately. Heart rate yesterday was 50. Today it is 18. He also reports having a migraine on Saturday and took Tylenol #3.  He felt "wiped out" Sunday. Today he also c/o of increasing GERD-- gas build-up and indigestion.   He denies any angina, shortness of breath, or edema. Suggested pt take his prilosec daily instead of MWF. Dr. Verl Blalock has reviewed: Recommended pt take Prilosec every day.                                       Decreased Cardizem to 160m daily

## 2012-02-24 ENCOUNTER — Encounter: Payer: Self-pay | Admitting: Family Medicine

## 2012-02-24 ENCOUNTER — Ambulatory Visit (INDEPENDENT_AMBULATORY_CARE_PROVIDER_SITE_OTHER): Payer: BC Managed Care – PPO | Admitting: Family Medicine

## 2012-02-24 VITALS — BP 120/84 | HR 68 | Temp 99.5°F | Ht 74.0 in | Wt 313.5 lb

## 2012-02-24 DIAGNOSIS — R51 Headache: Secondary | ICD-10-CM

## 2012-02-24 NOTE — Patient Instructions (Addendum)
Good to see you today. I think this was just a bad migraine you had.  I'm glad you're feeling some better. We will watch how you do now, let us know if any worsening or symptoms returning. Otherwise we will watch things for now. Make sure to get more water to drink - this could be a bit of dehydration worsening symptoms.

## 2012-02-24 NOTE — Progress Notes (Signed)
  Subjective:    Patient ID: Brandon [name], male    DOB: 1966/04/03, 46 y.o.   MRN: [id number]  HPI CC:HA  Pleasant 46 yo with h/o parox afib, migraines per pt, and arthritis presents today for eval HA and malaise.  HA Saturday described as R forehead pain and into eye, radiated into teeth.  Felt like constant sharp pain.  No pressure sensation.  Lasted ~3 hours.  Some nausea, some photophobia.  Went home, took tylenol with codeine which helped ease sxs.  Able to sleep.  Next day still hurting some but able to manage.  Since then "not feel right".  States migraines tend to occur on that side behind eye.  Longstanding h/o migraines per pt. Usually migraines with aura - vision changes with lines in front of eyes.  This was different in that not associated with aura.  Also now feeling more tired, fatigued.  Usually takes excedrin to control migraines, this time didn't have any available and that's why took 2 tylenol #3 (usually only takes 1 at a time).  No fevers/chills, vision changes, sinus congestion, urti sxs.  Denies confusion, dizziness, trouble speaking, or one sided weakness. No numbness.  No presyncope/LOC.  Again, denies any dizzy sxs with above.  Last summer worried about CAD, but clean cath 03/2011 - reviewed report.  Nl LV fxn.  "everyone in my family has strokes", including father.  Has had 6 mini strokes.  Past Medical History  Diagnosis Date  . DIVERTICULITIS, COLON 06/07/2009  . DEPRESSION 06/07/2009  . CHICKENPOX, HX OF 06/07/2009  . ARTHRITIS 06/07/2009  . ATRIAL FIBRILLATION, PAROXYSMAL 06/07/2009  . Hematuria    Family History  Problem Relation Age of Onset  . Arthritis    . Diabetes      1st degree relative  . Stroke      F 1st degree relative <60; M 1st degree relative <50    Recently cardizem decreased by cards, and rec take prilosec daily.  Review of Systems Per HPI    Objective:   Physical Exam  Nursing note and vitals reviewed. Constitutional: He is  oriented to person, place, and time. He appears well-developed and well-nourished. No distress.  HENT:  Head: Normocephalic and atraumatic.  Mouth/Throat: Oropharynx is clear and moist. No oropharyngeal exudate.  Eyes: Conjunctivae and EOM are normal. Pupils are equal, round, and reactive to light. No scleral icterus.  Neck: Normal range of motion. Neck supple. Carotid bruit is not present.  Cardiovascular: Normal rate, regular rhythm, normal heart sounds and intact distal pulses.   No murmur heard. Pulmonary/Chest: Breath sounds normal. No respiratory distress. He has no wheezes. He has no rales.  Musculoskeletal: He exhibits no edema.  Neurological: He is alert and oriented to person, place, and time. He has normal strength and normal reflexes. No cranial nerve deficit or sensory deficit. He displays a negative Romberg sign. Coordination normal.  Reflex Scores:      Bicep reflexes are 2+ on the right side and 2+ on the left side.      Patellar reflexes are 2+ on the right side and 2+ on the left side.      Achilles reflexes are 2+ on the right side and 2+ on the left side.      Normal FTN, HTS  Skin: Skin is warm and dry. No rash noted.      Assessment & Plan:

## 2012-02-25 DIAGNOSIS — R51 Headache: Secondary | ICD-10-CM | POA: Insufficient documentation

## 2012-02-25 DIAGNOSIS — R519 Headache, unspecified: Secondary | ICD-10-CM | POA: Insufficient documentation

## 2012-02-25 NOTE — Assessment & Plan Note (Signed)
Which has since resolved. Concerning fmhx CVA however no CVA sxs during episode. Regular rate on exam today. Vitals stable, nontoxic on exam, no focal neurologic findings. ?rpt migraine. Reassured, advised to continue to monitor status and update [place] if worsening. Also encouraged increasing fluid intake as may be a bit dehydrated causing worsening of sxs.

## 2012-03-03 ENCOUNTER — Ambulatory Visit (INDEPENDENT_AMBULATORY_CARE_PROVIDER_SITE_OTHER): Payer: BC Managed Care – PPO | Admitting: Family Medicine

## 2012-03-03 ENCOUNTER — Encounter: Payer: Self-pay | Admitting: Family Medicine

## 2012-03-03 VITALS — BP 120/88 | HR 80 | Temp 98.4°F | Ht 74.0 in | Wt 313.4 lb

## 2012-03-03 DIAGNOSIS — G43109 Migraine with aura, not intractable, without status migrainosus: Secondary | ICD-10-CM | POA: Insufficient documentation

## 2012-03-03 DIAGNOSIS — R5381 Other malaise: Secondary | ICD-10-CM

## 2012-03-03 DIAGNOSIS — R5383 Other fatigue: Secondary | ICD-10-CM

## 2012-03-03 HISTORY — DX: Migraine with aura, not intractable, without status migrainosus: G43.109

## 2012-03-03 NOTE — Patient Instructions (Signed)
Allergy tablet - Zyrtec or Allegra tablet  Meclizine if dizzy

## 2012-03-03 NOTE — Progress Notes (Signed)
  Patient Name: Brandon [name] Date of Birth: 02/17/1966 Age: 46 y.o. Medical Record Number: [id number] Gender: male Date of Encounter: 03/03/2012  History of Present Illness:  Brandon [name] is a 46 y.o. very pleasant male patient who presents with the following:  1-2 weeks ago, got a headache on the right side of the face, finally feeling better. Feels better today compared to yesterday.  Feels like eyes are squirrelly, dizzy or off balance Nauseated some  Gradually started to come on - has been improving, and he is feeling better somewhat today, compared to before, but he does have some continued headache. Pollen Not much bothering him, but he will have some occasional allergy symptoms at times in the past. No UTIs significantly.  Migraine -- normally will see lines and have been normal. The onset of this headache was not acute and was more insidious. No significant photophobia or phonophobia  He also did yesterday have some slight sensation of numbness around his lip, but that promptly resolved and has not are currently.  Past Medical History, Surgical History, Social History, Family History, Problem List, Medications, and Allergies have been reviewed and updated if relevant.  Review of Systems: ROS: GEN: Acute illness details above GI: Tolerating PO intake GU: maintaining adequate hydration and urination Pulm: No SOB Neuro o/w neg other than above Interactive and getting along well at home.  Otherwise, ROS is as per the HPI.   Physical Examination: Filed Vitals:   03/03/12 1058 03/03/12 1059 03/03/12 1101  BP: 112/72 120/80 120/88  Pulse: 56 76 80  Temp:  98.4 F (36.9 C)   TempSrc:  Oral   Height:  6' 2"  (1.88 m)   Weight:  313 lb 6.4 oz (142.157 kg)   SpO2:  97%     Body mass index is 40.24 kg/(m^2).   GEN: WDWN, NAD, Non-toxic, A & O x 3 HEENT: Atraumatic, Normocephalic. Neck supple. No masses, No LAD. Ears and Nose: No external deformity. CV: RRR,  No M/G/R. No JVD. No thrill. No extra heart sounds. PULM: CTA B, no wheezes, crackles, rhonchi. No retractions. No resp. distress. No accessory muscle use. ABD: S, NT, ND, +BS. No rebound tenderness. No HSM.  EXTR: No c/c/e  Neuro: CN 2-12 grossly intact. PERRLA. EOMI. Sensation intact throughout. Str 5/5 all extremities. DTR 2+. No clonus. A and o x 4. Romberg neg. Finger nose neg. Heel -shin neg.   PSYCH: Normally interactive. Conversant. Not depressed or anxious appearing.  Calm demeanor.     Assessment and Plan:  1. Classical migraine    2. Fatigue  TSH, T4, free   This may be a migraine very with some additional dizziness and vertigo sensations. He is improving. For now, I went ahead and start some antihistamines, and use the meclizine if he gets more dizzy.  If he has a return of his symptoms and return of a numbness in particular, he will be in contact  Orders Today: Orders Placed This Encounter  Procedures  . TSH  . T4, free    Medications Today: No orders of the defined types were placed in this encounter.

## 2012-03-04 ENCOUNTER — Encounter: Payer: Self-pay | Admitting: *Deleted

## 2012-03-09 ENCOUNTER — Telehealth: Payer: Self-pay | Admitting: Family Medicine

## 2012-03-09 NOTE — Telephone Encounter (Signed)
EMERGENT CALL:  Caller: Brandon [name]/Patient; PCP: Owens Loffler T.; CB#: [phone];  Call regarding lightheadedness.  Onset: 3 weeks ago.  Requesting lab results from week of 03/01/12.  Reports symptoms recurred 03/09/12.  Afebrile.  Advised to see MD within 24 hrs for symptoms worsen with movement of head and not previously evaluated per Dizziness or Vertigo Guideline.  Declined appt; requests staff call back with lab results.  OFFICE; Please call back.

## 2012-03-09 NOTE — Telephone Encounter (Signed)
Patient says that he is not worse but, was better for a few days and it came back today. He says that he starts feeling this way after he eats

## 2012-03-09 NOTE — Telephone Encounter (Signed)
Labs were normal - letter in mail  Can you check on him? i can see him tomorrow if worse

## 2012-03-10 ENCOUNTER — Telehealth: Payer: Self-pay | Admitting: Cardiology

## 2012-03-10 NOTE — Telephone Encounter (Signed)
Called him, he is better today. Does have URI symptoms and some sore throat.

## 2012-03-10 NOTE — Telephone Encounter (Signed)
Please return call to patient at [phone].  Patient has a bad cold and would like to know what type of medication would be safe for him to take.

## 2012-03-11 NOTE — Telephone Encounter (Signed)
I spoke with pt and he wanted to make sure he could take Zyrtec. Horton Chin RN

## 2012-03-22 ENCOUNTER — Telehealth: Payer: Self-pay | Admitting: Family Medicine

## 2012-03-22 NOTE — Telephone Encounter (Signed)
Caller: Brandon [name]/Patient; PCP: Owens Loffler T.; CB#: [phone]; Call regarding Headache; Afebrile/subjective.  Hx Migraines approx a month ago; has been evaluated by PCP; he also went to walk in clinic and was dx and treated for sinus infection. Now he c/o lower jaw feeling tired/ numb and it extends to TMJ area; worse with stress.  He relates he feels that he may be clenching his jaw when he is stressed.  He has appt for 03/24/12 w/ PCP.

## 2012-03-22 NOTE — Telephone Encounter (Signed)
reasonable

## 2012-03-24 ENCOUNTER — Ambulatory Visit (INDEPENDENT_AMBULATORY_CARE_PROVIDER_SITE_OTHER): Payer: BC Managed Care – PPO | Admitting: Family Medicine

## 2012-03-24 ENCOUNTER — Encounter: Payer: Self-pay | Admitting: Family Medicine

## 2012-03-24 ENCOUNTER — Encounter: Payer: Self-pay | Admitting: Neurology

## 2012-03-24 VITALS — BP 130/70 | HR 78 | Temp 98.4°F | Ht 74.0 in | Wt 319.8 lb

## 2012-03-24 DIAGNOSIS — R209 Unspecified disturbances of skin sensation: Secondary | ICD-10-CM

## 2012-03-24 DIAGNOSIS — R202 Paresthesia of skin: Secondary | ICD-10-CM

## 2012-03-24 DIAGNOSIS — R42 Dizziness and giddiness: Secondary | ICD-10-CM

## 2012-03-24 DIAGNOSIS — R51 Headache: Secondary | ICD-10-CM

## 2012-03-24 MED ORDER — SUMATRIPTAN SUCCINATE 50 MG PO TABS: 50.0000 mg | ORAL_TABLET | ORAL | Status: DC | PRN

## 2012-03-24 NOTE — Progress Notes (Signed)
Patient Name: Brandon [name] Date of Birth: [birth year] Age: 46 y.o. Medical Record Number: [id number] Gender: male Date of Encounter: 03/24/2012  History of Present Illness:  Brandon [name] is a 46 y.o. very pleasant male patient who presents with the following:  Pleasant gentleman with a history of paroxysmal atrial fibrillation, well-controlled on flecainide and on aspirin, who also presents and has a significant history of migraine. These have been occurring rarely over the last few years. I had seen several times recently, and the patient was having some headaches, he also had some jaw tingling as well as some dizziness and vertigo sensations.  Today is not too bad. Tingly sensation will comes and goes and will wind up  In jaw and feels tired. In jaw. Muscles. No pain. He describes it more as a tingle.  Yesterday left arm felt a little tight, and occ in R.  Sometimes like room is moving. He has not had a preceding illness. He did not have an acute onset of the symptoms. We initially thought that there may be some vertigo, and gave him some meclizine tablets, and he has some Xanax he uses at home p.r.n.  Patient Active Problem List  Diagnoses  . HYPERLIPIDEMIA  . OBESITY  . DEPRESSION  . ATRIAL FIBRILLATION, PAROXYSMAL  . DIVERTICULITIS, COLON  . ARTHRITIS  . CHICKENPOX, HX OF  . Erectile dysfunction  . Gross hematuria  . Basal cell carcinoma  . Headache  . Classical migraine   Past Medical History  Diagnosis Date  . DIVERTICULITIS, COLON 06/07/2009  . DEPRESSION 06/07/2009  . CHICKENPOX, HX OF 06/07/2009  . ARTHRITIS 06/07/2009  . ATRIAL FIBRILLATION, PAROXYSMAL 06/07/2009  . Hematuria   . Classical migraine 03/03/2012   No past surgical history on file. History  Substance Use Topics  . Smoking status: Never Smoker   . Smokeless tobacco: Current User    Types: Chew   Comment: occasionally  . Alcohol Use: Yes     rare   Family History  Problem Relation Age of  Onset  . Arthritis    . Diabetes      1st degree relative  . Stroke      F 1st degree relative <60; M 1st degree relative <50   Allergies  Allergen Reactions  . Penicillins    Current Outpatient Prescriptions on File Prior to Visit  Medication Sig Dispense Refill  . ALPRAZolam (XANAX) 1 MG tablet       . aspirin 325 MG tablet Take 325 mg by mouth daily.        Marland Kitchen diltiazem (TIAZAC) 180 MG 24 hr capsule Take 1 capsule (180 mg total) by mouth daily.  90 capsule  3  . flecainide (TAMBOCOR) 100 MG tablet Take 1 tablet (100 mg total) by mouth 2 (two) times daily.  180 tablet  3  . furosemide (LASIX) 20 MG tablet TAKE ONE TABLET BY MOUTH IN THE MORNING AS NEEDED FOR SWELLING.  30 tablet  12  . omeprazole (PRILOSEC) 20 MG capsule Take 20 mg by mouth daily.       Marland Kitchen VIAGRA 100 MG tablet       . SUMAtriptan (IMITREX) 50 MG tablet Take 1 tablet (50 mg total) by mouth every 2 (two) hours as needed for migraine.  10 tablet  3     Past Medical History, Surgical History, Social History, Family History, Problem List, Medications, and Allergies have been reviewed and updated if relevant.  Review of Systems: No chest  pain. No shortness of breath. No photophobia. No phonophobia. No classical aura. No alteration of memory. No facial drooping. No slurred speech. No visual disturbance. No gait disturbance.  Physical Examination: Filed Vitals:   03/24/12 0916  BP: 130/70  Pulse: 78  Temp: 98.4 F (36.9 C)  TempSrc: Oral  Height: 6' 2"  (1.88 m)  Weight: 319 lb 12.8 oz (145.06 kg)  SpO2: 96%    Body mass index is 41.06 kg/(m^2).   GEN: WDWN, NAD, Non-toxic, A & O x 3 HEENT: Atraumatic, Normocephalic. Neck supple. No masses, No LAD. Ears and Nose: No external deformity. CV: RRR, No M/G/R. No JVD. No thrill. No extra heart sounds. PULM: CTA B, no wheezes, crackles, rhonchi. No retractions. No resp. distress. No accessory muscle use. ABD: S, NT, ND, +BS. No rebound tenderness. No HSM.  EXTR: No  c/c/e  Neuro: CN 2-12 grossly intact. PERRLA. EOMI. Sensation intact throughout. Str 5/5 all extremities. DTR 2+. No clonus. A and o x 4. Romberg neg. Finger nose neg. Heel -shin neg.   PSYCH: Normally interactive. Conversant. Not depressed or anxious appearing.  Calm demeanor.     Assessment and Plan:  1. Headache  Ambulatory referral to Neurology  2. Tingling  Ambulatory referral to Neurology  3. Dizziness  Ambulatory referral to Neurology   I am not clear what is driving this gentleman symptoms. He certainly has a history of migraine, and it may all be migraine. He also has a BMI of 41 and a history paroxysmal A. Fib. To me this would seem very unusual for a TIA. I think that the best course of action for this gentleman is to have a formal neurological consult to get their opinion.  We can try imitrex in the meantime to see if this helps with HA  Orders Today: Orders Placed This Encounter  Procedures  . Ambulatory referral to Neurology    Referral Priority:  Routine    Referral Type:  Consultation    Referral Reason:  Specialty Services Required    Referred to Provider:  Clearnce Sorrel, MD    Requested Specialty:  Neurology    Number of Visits Requested:  1    Medications Today: Meds ordered this encounter  Medications  . SUMAtriptan (IMITREX) 50 MG tablet    Sig: Take 1 tablet (50 mg total) by mouth every 2 (two) hours as needed for migraine.    Dispense:  10 tablet    Refill:  3

## 2012-04-05 ENCOUNTER — Ambulatory Visit: Payer: BC Managed Care – PPO | Admitting: Family Medicine

## 2012-04-05 DIAGNOSIS — Z0289 Encounter for other administrative examinations: Secondary | ICD-10-CM

## 2012-05-05 ENCOUNTER — Encounter: Payer: Self-pay | Admitting: Cardiology

## 2012-05-05 ENCOUNTER — Ambulatory Visit (INDEPENDENT_AMBULATORY_CARE_PROVIDER_SITE_OTHER): Payer: BC Managed Care – PPO | Admitting: Cardiology

## 2012-05-05 VITALS — BP 116/76 | HR 68 | Ht 74.0 in | Wt 315.0 lb

## 2012-05-05 DIAGNOSIS — I4891 Unspecified atrial fibrillation: Secondary | ICD-10-CM

## 2012-05-05 NOTE — Assessment & Plan Note (Signed)
Well controlled on flecainide. No changes made. He is to consider a sleep study since he does have the increase his risk of recurrent A. fib.

## 2012-05-05 NOTE — Progress Notes (Signed)
HPI  [name] comes in today for evaluation of his paroxysmal atrial fib successfully managed with flecainide. He has not had any further breakthroughs.  He is concerned he has sleep apnea. He's been advised to get a sleep study. No one has observed him having apneic spells. He says he's always been sleepy when he sits down. He is not unusually tired.  His last blood work shows total cholesterol 116 HDL 32 LDL 73 on no therapy.  Past Medical History  Diagnosis Date  . DIVERTICULITIS, COLON 06/07/2009  . DEPRESSION 06/07/2009  . CHICKENPOX, HX OF 06/07/2009  . ARTHRITIS 06/07/2009  . ATRIAL FIBRILLATION, PAROXYSMAL 06/07/2009  . Hematuria   . Classical migraine 03/03/2012    Current Outpatient Prescriptions  Medication Sig Dispense Refill  . ALPRAZolam (XANAX) 1 MG tablet       . aspirin 325 MG tablet Take 325 mg by mouth daily.        Marland Kitchen diltiazem (TIAZAC) 180 MG 24 hr capsule Take 1 capsule (180 mg total) by mouth daily.  90 capsule  3  . flecainide (TAMBOCOR) 100 MG tablet Take 1 tablet (100 mg total) by mouth 2 (two) times daily.  180 tablet  3  . furosemide (LASIX) 20 MG tablet TAKE ONE TABLET BY MOUTH IN THE MORNING AS NEEDED FOR SWELLING.  30 tablet  12  . omeprazole (PRILOSEC) 20 MG capsule Take 20 mg by mouth daily.       . SUMAtriptan (IMITREX) 50 MG tablet Take 1 tablet (50 mg total) by mouth every 2 (two) hours as needed for migraine.  10 tablet  3  . VIAGRA 100 MG tablet         Allergies  Allergen Reactions  . Penicillins     Family History  Problem Relation Age of Onset  . Arthritis    . Diabetes      1st degree relative  . Stroke      F 1st degree relative <60; M 1st degree relative <50    History   Social History  . Marital Status: Single    Spouse Name: N/A    Number of Children: N/A  . Years of Education: N/A   Occupational History  . self employed    Social History Main Topics  . Smoking status: Never Smoker   . Smokeless tobacco: Current User   Types: Chew   Comment: occasionally  . Alcohol Use: Yes     rare  . Drug Use: No  . Sexually Active: Not on file   Other Topics Concern  . Not on file   Social History Narrative   Does not do regular exercise. Former dip - quit about 1 year ago.     ROS ALL NEGATIVE EXCEPT THOSE NOTED IN HPI  PE  General Appearance: well developed, well nourished in no acute distress, muscular, obese HEENT: symmetrical face, PERRLA, good dentition  Neck: no JVD, thyromegaly, or adenopathy, trachea midline Chest: symmetric without deformity Cardiac: PMI non-displaced, RRR, normal S1, S2, no gallop or murmur Lung: clear to ausculation and percussion Vascular: all pulses full without bruits  Abdominal: nondistended, nontender, good bowel sounds, no HSM, no bruits Extremities: no cyanosis, clubbing or edema, no sign of DVT, no varicosities  Skin: normal color, no rashes Neuro: alert and oriented x 3, non-focal Pysch: normal affect  EKG Normal sinus rhythm, normal EKG BMET    Component Value Date/Time   NA 138 01/05/2012 1041   K 4.2 01/05/2012 1041  CL 105 01/05/2012 1041   CO2 29 01/05/2012 1041   GLUCOSE 80 01/05/2012 1041   BUN 11 01/05/2012 1041   CREATININE 0.8 01/05/2012 1041   CALCIUM 8.9 01/05/2012 1041   GFRNONAA >60 03/23/2011 1546   GFRAA  Value: >60        The eGFR has been calculated using the MDRD equation. This calculation has not been validated in all clinical situations. eGFR's persistently <60 mL/min signify possible Chronic Kidney Disease. 03/23/2011 1546    Lipid Panel     Component Value Date/Time   CHOL 116 01/05/2012 1041   TRIG 57.0 01/05/2012 1041   HDL 32.10* 01/05/2012 1041   CHOLHDL 4 01/05/2012 1041   VLDL 11.4 01/05/2012 1041   LDLCALC 73 01/05/2012 1041    CBC    Component Value Date/Time   WBC 10.6* 01/05/2012 1041   RBC 5.25 01/05/2012 1041   HGB 15.6 01/05/2012 1041   HCT 46.4 01/05/2012 1041   PLT 214.0 01/05/2012 1041   MCV 88.3 01/05/2012 1041   MCH 29.5 03/23/2011 1546    MCHC 33.6 01/05/2012 1041   RDW 14.7* 01/05/2012 1041   LYMPHSABS 2.5 01/05/2012 1041   MONOABS 0.7 01/05/2012 1041   EOSABS 0.1 01/05/2012 1041   BASOSABS 0.0 01/05/2012 1041

## 2012-05-05 NOTE — Patient Instructions (Addendum)
Your physician recommends that you continue on your current medications as directed. Please refer to the Current Medication list given to you today.  Your physician wants you to follow-up in: 6 months. You will receive a reminder letter in the mail two months in advance. If you don't receive a letter, please call our office to schedule the follow-up appointment.  

## 2012-05-26 ENCOUNTER — Ambulatory Visit (INDEPENDENT_AMBULATORY_CARE_PROVIDER_SITE_OTHER): Payer: BC Managed Care – PPO | Admitting: Neurology

## 2012-05-26 ENCOUNTER — Ambulatory Visit (INDEPENDENT_AMBULATORY_CARE_PROVIDER_SITE_OTHER): Payer: BC Managed Care – PPO | Admitting: Family Medicine

## 2012-05-26 ENCOUNTER — Encounter: Payer: Self-pay | Admitting: Neurology

## 2012-05-26 ENCOUNTER — Encounter: Payer: Self-pay | Admitting: Family Medicine

## 2012-05-26 VITALS — BP 108/80 | HR 84 | Temp 98.6°F | Wt 319.0 lb

## 2012-05-26 VITALS — BP 126/70 | HR 56 | Ht 73.0 in | Wt 317.0 lb

## 2012-05-26 DIAGNOSIS — L989 Disorder of the skin and subcutaneous tissue, unspecified: Secondary | ICD-10-CM

## 2012-05-26 DIAGNOSIS — R42 Dizziness and giddiness: Secondary | ICD-10-CM

## 2012-05-26 DIAGNOSIS — R51 Headache: Secondary | ICD-10-CM

## 2012-05-26 DIAGNOSIS — L570 Actinic keratosis: Secondary | ICD-10-CM

## 2012-05-26 NOTE — Progress Notes (Addendum)
   Nature conservation officer at [facility] [address] [place] [address] Phone: [phone] Fax: [phone]  Date:  05/26/2012   Name:  Brandon [name]   DOB:  Jul 29, 1966   MRN:  [id number]  PCP:  Hannah Beat, MD    Chief Complaint: Check ? mole on left side of neck- there x one month   History of Present Illness:  Brandon [name] is a 46 y.o. very pleasant male patient who presents with the following:  Skin Lesion, L side of neck, procedure only:  Cryotherapy x 2, L nape of neck 2 lesions, ? If enlarging over the last month  Reason: irritated, enlarging lesions Location: L nape of neck  Liquid nitrogen was applied using liquid nitrogen and a Q-tip x 3 without difficulty. Tolerated well without complications.   Most c/w AK vs Seb K. Could be lichen simplex.   1. Actinic keratoses   2. Generalized skin lesions     Most c/w ak

## 2012-05-26 NOTE — Patient Instructions (Addendum)
Your MRI and MRA's are scheduled for Friday, July 26th at 8:00am.  Please arrive to [facility], first floor admitting by 7:45am.  [phone].

## 2012-05-26 NOTE — Progress Notes (Signed)
Dear Dr. Patsy Lager,  Thank you for having me see Brandon [name] in consultation today at [facility] for his problem with migraine headaches and dizziness.  As you may recall, he is a 46 y.o. year old male with a history of paroxysmal atrial fibrillation, rhythm controlled on fleicanide, who has about a 5 year history of "migraine headaches".  The event of concern was a slightly more prolonged migraine headache in April, but was followed by about 4 weeks of what sounds like vertigo and "eyes jumping" ? nystagmus.  His headache was not active during the 4 weeks of vertigo/dizziness.  He did not note a relationship between change in position and the dizziness.  He denies any other changes in hearing during that time.  He has tinnitus as well, but this is chronic.  He interestingly was treated for bronchitis and a sinus infection during that time as well by both urgent care and an ENT.  The vertiginous feelings have completely resolved.  With respect to his migraine headaches, they are infrequent, occuring about 2-3 times per year.  They are always right sided, with pounding or throbbing pain, and a visual aura (that starts in the back of his head and moves forward.Marland Kitchen).  They typically last a day, and he really only goes and lies down.  Has taken Excedrin, but without much relief.  No head imaging has been done.  Past Medical History  Diagnosis Date  . DIVERTICULITIS, COLON 06/07/2009  . DEPRESSION 06/07/2009  . CHICKENPOX, HX OF 06/07/2009  . ARTHRITIS 06/07/2009  . ATRIAL FIBRILLATION, PAROXYSMAL 06/07/2009  . Hematuria   . Classical migraine 03/03/2012    No past surgical history on file.  History   Social History  . Marital Status: Single    Spouse Name: N/A    Number of Children: N/A  . Years of Education: N/A   Occupational History  . self employed    Social History Main Topics  . Smoking status: Never Smoker   . Smokeless tobacco: Current User    Types: Chew   Comment:  occasionally  . Alcohol Use: Yes     rare  . Drug Use: No  . Sexually Active: None   Other Topics Concern  . None   Social History Narrative   Does not do regular exercise. Former dip - quit about 1 year ago.     Family History  Problem Relation Age of Onset  . Arthritis    . Diabetes      1st degree relative  . Stroke      F 1st degree relative <60; M 1st degree relative <50  - no history of migraine headache  Current Outpatient Prescriptions on File Prior to Visit  Medication Sig Dispense Refill  . ALPRAZolam (XANAX) 1 MG tablet       . aspirin 325 MG tablet Take 325 mg by mouth daily.        Marland Kitchen diltiazem (TIAZAC) 180 MG 24 hr capsule Take 1 capsule (180 mg total) by mouth daily.  90 capsule  3  . flecainide (TAMBOCOR) 100 MG tablet Take 1 tablet (100 mg total) by mouth 2 (two) times daily.  180 tablet  3  . furosemide (LASIX) 20 MG tablet TAKE ONE TABLET BY MOUTH IN THE MORNING AS NEEDED FOR SWELLING.  30 tablet  12  . omeprazole (PRILOSEC) 20 MG capsule Take 20 mg by mouth daily.       . SUMAtriptan (IMITREX) 50 MG tablet  Take 1 tablet (50 mg total) by mouth every 2 (two) hours as needed for migraine.  10 tablet  3  . VIAGRA 100 MG tablet         Allergies  Allergen Reactions  . Penicillins       ROS:  13 systems were reviewed and are notable for obesity.  All other review of systems are unremarkable.   Examination:  Filed Vitals:   05/26/12 0935  BP: 126/70  Pulse: 56  Height: 6\' 1"  (1.854 m)  Weight: 317 lb (143.79 kg)     In general, obese gentleman  Cardiovascular: The patient has a regular rate and rhythm and no carotid bruits.  Fundoscopy:  Disks are flat. Vessel caliber within normal limits.  Mental status:   The patient is oriented to person, place and time. Recent and remote memory are intact. Attention span and concentration are normal. Language including repetition, naming, following commands are intact. Fund of knowledge of current and  historical events, as well as vocabulary are normal.  Cranial Nerves: Pupils are equally round and reactive to light. Visual fields full to confrontation. Extraocular movements are intact without nystagmus. Facial sensation and muscles of mastication are intact. Muscles of facial expression are symmetric. Hearing intact to bilateral finger rub. Tongue protrusion, uvula, palate midline.  Shoulder shrug intact  Motor:  The patient has normal bulk and tone, no pronator drift.  There are no adventitious movements.  5/5 muscle strength bilaterally.  Reflexes:  Brisk, but not pathologically so.  Toes down  Coordination:  Normal finger to nose.  No dysdiadokinesia.  Sensation is intact to temperature and vibration.  Gait and Station are normal.  Tandem gait is intact.  Romberg is mildly unsteady.   Impression/Recommendations: 1.  Prolonged migraine followed by dizziness/vertigo - It sounds like what made this headache standout for the patient was the prolonged period where he would have spells of dizziness with abnormal eye movements afterwards.  This sounds like vertigo.  However, it was not clearly positional, making BPPV less likely.  His migraine was also not active so I don't think it was migrainous.  Certainly one can get vertigo in the setting of sinus/ear infection but I am not sure that he even had that.  Given his risk factors, I think it makes sense to get an MRI of his brain and MRA of his head and neck to look for the possibility of a brain stem stroke.  I do think this is unlikely however, and that we will settle on the non-specific  diagnosis of a peripheral vertigo in the end. 2.  Migraines - he can use Imitrex as you directed.   If his studies are negative he can return to you for follow up.  Thank you for having [place] see Brandon [name] in consultation.  Feel free to contact me with any questions.  Lupita Raider Modesto Charon, MD [facility], [place] [address] [place], [place] [address] Phone: [phone] Fax: [phone].

## 2012-05-28 ENCOUNTER — Ambulatory Visit (HOSPITAL_COMMUNITY)
Admission: RE | Admit: 2012-05-28 | Discharge: 2012-05-28 | Disposition: A | Discharge status: Home / Self Care | Payer: BC Managed Care – PPO | Source: Ambulatory Visit | Attending: Diagnostic Radiology | Admitting: Diagnostic Radiology

## 2012-05-28 ENCOUNTER — Ambulatory Visit (HOSPITAL_COMMUNITY)
Admission: RE | Admit: 2012-05-28 | Discharge: 2012-05-28 | Disposition: A | Discharge status: Home / Self Care | Payer: BC Managed Care – PPO | Source: Ambulatory Visit | Attending: Neurology | Admitting: Neurology

## 2012-05-28 ENCOUNTER — Ambulatory Visit (HOSPITAL_COMMUNITY)
Admission: RE | Admit: 2012-05-28 | Discharge: 2012-05-28 | Payer: BC Managed Care – PPO | Source: Ambulatory Visit | Attending: Neurology | Admitting: Neurology

## 2012-05-28 ENCOUNTER — Ambulatory Visit (HOSPITAL_COMMUNITY): Admission: RE | Admit: 2012-05-28 | Payer: BC Managed Care – PPO | Source: Ambulatory Visit

## 2012-05-28 ENCOUNTER — Other Ambulatory Visit (HOSPITAL_COMMUNITY): Payer: Self-pay | Admitting: Diagnostic Radiology

## 2012-05-28 DIAGNOSIS — Z01818 Encounter for other preprocedural examination: Secondary | ICD-10-CM | POA: Insufficient documentation

## 2012-05-28 DIAGNOSIS — IMO0002 Reserved for concepts with insufficient information to code with codable children: Secondary | ICD-10-CM

## 2012-05-28 DIAGNOSIS — R42 Dizziness and giddiness: Secondary | ICD-10-CM

## 2012-05-28 DIAGNOSIS — R51 Headache: Secondary | ICD-10-CM

## 2012-09-17 ENCOUNTER — Ambulatory Visit: Payer: BC Managed Care – PPO | Admitting: Cardiology

## 2012-11-08 ENCOUNTER — Ambulatory Visit (INDEPENDENT_AMBULATORY_CARE_PROVIDER_SITE_OTHER): Payer: BC Managed Care – PPO | Admitting: Family Medicine

## 2012-11-08 VITALS — BP 130/88 | HR 64 | Temp 97.9°F | Ht 73.0 in | Wt 319.0 lb

## 2012-11-08 DIAGNOSIS — Z23 Encounter for immunization: Secondary | ICD-10-CM

## 2012-11-08 DIAGNOSIS — Z125 Encounter for screening for malignant neoplasm of prostate: Secondary | ICD-10-CM

## 2012-11-08 DIAGNOSIS — Z13220 Encounter for screening for lipoid disorders: Secondary | ICD-10-CM

## 2012-11-08 DIAGNOSIS — M67919 Unspecified disorder of synovium and tendon, unspecified shoulder: Secondary | ICD-10-CM

## 2012-11-08 DIAGNOSIS — Z5189 Encounter for other specified aftercare: Secondary | ICD-10-CM

## 2012-11-08 DIAGNOSIS — M719 Bursopathy, unspecified: Secondary | ICD-10-CM

## 2012-11-08 DIAGNOSIS — Z79899 Other long term (current) drug therapy: Secondary | ICD-10-CM

## 2012-11-08 LAB — BASIC METABOLIC PANEL
BUN: 13 mg/dL (ref 6–23)
Creatinine, Ser: 0.9 mg/dL (ref 0.4–1.5)
GFR: 90.72 mL/min (ref >60.00)
Glucose, Bld: 89 mg/dL (ref 70–99)

## 2012-11-08 LAB — LIPID PANEL
Cholesterol: 136 mg/dL (ref 0–200)
LDL Cholesterol: 92 mg/dL (ref 0–99)

## 2012-11-08 LAB — CBC WITH DIFFERENTIAL/PLATELET
Basophils Absolute: 0 10*3/uL (ref 0.0–0.1)
Eosinophils Absolute: 0.1 10*3/uL (ref 0.0–0.7)
HCT: 45.5 % (ref 39.0–52.0)
Lymphs Abs: 1.7 10*3/uL (ref 0.7–4.0)
MCHC: 33.9 g/dL (ref 30.0–36.0)
MCV: 85.9 fl (ref 78.0–100.0)
Monocytes Absolute: 0.4 10*3/uL (ref 0.1–1.0)
Neutrophils Relative %: 67.5 % (ref 43.0–77.0)
Platelets: 241 10*3/uL (ref 150.0–400.0)
RDW: 14.2 % (ref 11.5–14.6)

## 2012-11-08 LAB — HEPATIC FUNCTION PANEL
ALT: 30 U/L (ref 0–53)
Alkaline Phosphatase: 45 U/L (ref 39–117)
Bilirubin, Direct: 0.1 mg/dL (ref 0.0–0.3)
Total Bilirubin: 0.9 mg/dL (ref 0.3–1.2)

## 2012-11-08 NOTE — Patient Instructions (Addendum)
REFERRAL: GO THE THE FRONT ROOM AT THE ENTRANCE OF OUR CLINIC, NEAR CHECK IN. ASK FOR Brandon [name]. SHE WILL HELP YOU SET UP YOUR REFERRAL. DATE: TIME:  

## 2012-11-08 NOTE — Progress Notes (Signed)
Nature conservation officer at [facility] [address] [place] [address] Phone: [phone] Fax: [phone]  Date:  11/08/2012   Name:  Brandon [name]   DOB:  1966/04/10   MRN:  [id number] Gender: male Age: 47 y.o.  PCP:  Hannah Beat, MD  Evaluating MD: Hannah Beat, MD   Chief Complaint: Shoulder Pain   History of Present Illness:  Brandon [name] is a 47 y.o. pleasant patient who presents with the following:  Trouble with shoulder with shoulders for a long time --- Brandon [name]. Brandon [name].  Anterior shoulders bilatersal and hurt distally and in the biceps, and sometimes will get some pain in the right hand. Pain with grip. Classic t-shirt distribution and pain with overhead movements and with terminal IROM.   Reports having having had upwards of 6 subacromial injections in each shoulder in the past.   Patient Active Problem List  Diagnosis  . OBESITY  . DEPRESSION  . ATRIAL FIBRILLATION, PAROXYSMAL  . DIVERTICULITIS, COLON  . ARTHRITIS  . Erectile dysfunction  . Gross hematuria  . Basal cell carcinoma  . Classical migraine    Past Medical History  Diagnosis Date  . DIVERTICULITIS, COLON 06/07/2009  . DEPRESSION 06/07/2009  . CHICKENPOX, HX OF 06/07/2009  . ARTHRITIS 06/07/2009  . ATRIAL FIBRILLATION, PAROXYSMAL 06/07/2009  . Hematuria   . Classical migraine 03/03/2012    No past surgical history on file.  History  Substance Use Topics  . Smoking status: Never Smoker   . Smokeless tobacco: Current User    Types: Chew     Comment: occasionally  . Alcohol Use: Yes     Comment: rare    Family History  Problem Relation Age of Onset  . Arthritis    . Diabetes      1st degree relative  . Stroke      F 1st degree relative <60; M 1st degree relative <50    Allergies  Allergen Reactions  . Penicillins     Medication list has been reviewed and updated.  Outpatient Prescriptions Prior to Visit  Medication Sig Dispense Refill  . ALPRAZolam  (XANAX) 1 MG tablet       . aspirin 325 MG tablet Take 325 mg by mouth daily.        Marland Kitchen diltiazem (TIAZAC) 180 MG 24 hr capsule Take 1 capsule (180 mg total) by mouth daily.  90 capsule  3  . flecainide (TAMBOCOR) 100 MG tablet Take 1 tablet (100 mg total) by mouth 2 (two) times daily.  180 tablet  3  . furosemide (LASIX) 20 MG tablet TAKE ONE TABLET BY MOUTH IN THE MORNING AS NEEDED FOR SWELLING.  30 tablet  12  . omeprazole (PRILOSEC) 20 MG capsule Take 20 mg by mouth daily.       . SUMAtriptan (IMITREX) 50 MG tablet Take 1 tablet (50 mg total) by mouth every 2 (two) hours as needed for migraine.  10 tablet  3   Last reviewed on 11/08/2012  8:19 AM by Consuello Masse, CMA  Review of Systems:   GEN: No fevers, chills. Nontoxic. Primarily MSK c/o today. MSK: Detailed in the HPI GI: tolerating PO intake without difficulty Neuro: No numbness, parasthesias, or tingling associated. Otherwise the pertinent positives of the ROS are noted above.    Physical Examination: BP 130/88  Pulse 64  Temp 97.9 F (36.6 C) (Oral)  Ht 6\' 1"  (1.854 m)  Wt 319 lb (144.697 kg)  BMI  42.09 kg/m2  SpO2 97%  Ideal Body Weight: Weight in (lb) to have BMI = 25: 189.1    GEN: Well-developed,well-nourished,in no acute distress; alert,appropriate and cooperative throughout examination HEENT: Normocephalic and atraumatic without obvious abnormalities. Ears, externally no deformities PULM: Breathing comfortably in no respiratory distress EXT: No clubbing, cyanosis, or edema PSYCH: Normally interactive. Cooperative during the interview. Pleasant. Friendly and conversant. Not anxious or depressed appearing. Normal, full affect.  Shoulder: B Inspection: No muscle wasting or winging Ecchymosis/edema: neg  AC joint, scapula, clavicle: NT Cervical spine: NT, full ROM Spurling's: neg Abduction: full, 5/5 Flexion: full, 5/5 IR, full, lift-off: 5/5 ER at neutral: full, 5/5 AC crossover: neg Neer: pos Hawkins:  pos Drop Test: neg Empty Can: pos Supraspinatus insertion: mild-mod T Bicipital groove: NT Speed's: neg Yergason's: neg Sulcus sign: neg Scapular dyskinesis: none C5-T1 intact  Neuro: Sensation intact Grip 5/5   Assessment and Plan:  1. Tendinopathy of rotator cuff  Ambulatory referral to Physical Therapy  2. Need for prophylactic vaccination and inoculation against influenza  Flu vaccine greater than or equal to 3yo preservative free IM  3. Screening for lipoid disorders  Lipid panel  4. Unspecified aftercare    5. Special screening for malignant neoplasm of prostate  PSA  6. Encounter for long-term (current) use of other medications  Basic metabolic panel, CBC with Differential, Hepatic function panel   Classic impingement. I am not going to reinject his subac space. Clearly no tear, very strong. Reviewed the anatomy and pathophys. Work on [employer] and scapular stabilization.  Formal PT  Orders Today:  Orders Placed This Encounter  Procedures  . Flu vaccine greater than or equal to 3yo preservative free IM  . Basic metabolic panel  . CBC with Differential  . Lipid panel  . PSA  . Hepatic function panel  . Ambulatory referral to Physical Therapy    Referral Priority:  Routine    Referral Type:  Physical Medicine    Referral Reason:  Specialty Services Required    Requested Specialty:  Physical Therapy    Number of Visits Requested:  1    Updated Medication List: (Includes new medications, updates to list, dose adjustments) No orders of the defined types were placed in this encounter.    Medications Discontinued: There are no discontinued medications.   Hannah Beat, MD

## 2012-11-09 ENCOUNTER — Encounter: Payer: Self-pay | Admitting: *Deleted

## 2012-11-09 ENCOUNTER — Encounter: Payer: Self-pay | Admitting: Family Medicine

## 2012-11-15 ENCOUNTER — Ambulatory Visit (INDEPENDENT_AMBULATORY_CARE_PROVIDER_SITE_OTHER): Payer: BC Managed Care – PPO | Admitting: Family Medicine

## 2012-11-15 ENCOUNTER — Encounter: Payer: Self-pay | Admitting: Family Medicine

## 2012-11-15 VITALS — BP 120/72 | HR 80 | Temp 98.3°F | Ht 73.0 in | Wt 311.8 lb

## 2012-11-15 DIAGNOSIS — Z0000 Encounter for general adult medical examination without abnormal findings: Secondary | ICD-10-CM

## 2012-11-15 MED ORDER — HYDROCODONE-HOMATROPINE 5-1.5 MG/5ML PO SYRP: ORAL_SOLUTION | ORAL | Status: DC

## 2012-11-15 NOTE — Progress Notes (Signed)
Nature conservation officer at [facility] [address] [place] [address] Phone: [phone] Fax: [phone]  Date:  11/15/2012   Name:  Brandon [name]   DOB:  08-14-1966   MRN:  [id number] Gender: male Age: 47 y.o.  PCP:  Hannah Beat, MD  Evaluating MD: Hannah Beat, MD   Chief Complaint: Annual Exam   History of Present Illness:  Brandon [name] is a 47 y.o. pleasant patient who presents with the following:  CPX:  Got sick last Tuesday night, started to feel a cold burning in his chest, had no fever, then running fever, and achy all over. Friday felt a little better, and still feeling bad now. Coughing so much that sides are hurting.   Felt a lot better.  Feeling dizzy, swimmy headed and sweating a lot.   Nothing else bothering him.   Preventative Health Maintenance Visit:  Health Maintenance Summary Reviewed and updated, unless pt declines services.  Tobacco History Reviewed. Dips occ Alcohol: No concerns, no excessive use Exercise Habits: minimal STD concerns: no risk or activity to increase risk Drug Use: None Encouraged self-testicular check  Health Maintenance  Topic Date Due  . Influenza Vaccine  07/04/2013  . Tetanus/tdap  12/04/2020    Labs reviewed with the patient.  Results for orders placed in visit on 11/08/12  BASIC METABOLIC PANEL      Component Value Range   Sodium 138  135 - 145 mEq/L   Potassium 4.6  3.5 - 5.1 mEq/L   Chloride 105  96 - 112 mEq/L   CO2 26  19 - 32 mEq/L   Glucose, Bld 89  70 - 99 mg/dL   BUN 13  6 - 23 mg/dL   Creatinine, Ser 0.9  0.4 - 1.5 mg/dL   Calcium 9.2  8.4 - 29.5 mg/dL   GFR 28.41  >32.44 mL/min  CBC WITH DIFFERENTIAL      Component Value Range   WBC 7.2  4.5 - 10.5 K/uL   RBC 5.30  4.22 - 5.81 Mil/uL   Hemoglobin 15.4  13.0 - 17.0 g/dL   HCT 01.0  27.2 - 53.6 %   MCV 85.9  78.0 - 100.0 fl   MCHC 33.9  30.0 - 36.0 g/dL   RDW 64.4  03.4 - 74.2 %   Platelets 241.0  150.0 - 400.0 K/uL   Neutrophils Relative 67.5  43.0 - 77.0 %   Lymphocytes Relative 24.4  12.0 - 46.0 %   Monocytes Relative 5.9  3.0 - 12.0 %   Eosinophils Relative 1.8  0.0 - 5.0 %   Basophils Relative 0.4  0.0 - 3.0 %   Neutro Abs 4.8  1.4 - 7.7 K/uL   Lymphs Abs 1.7  0.7 - 4.0 K/uL   Monocytes Absolute 0.4  0.1 - 1.0 K/uL   Eosinophils Absolute 0.1  0.0 - 0.7 K/uL   Basophils Absolute 0.0  0.0 - 0.1 K/uL  LIPID PANEL      Component Value Range   Cholesterol 136  0 - 200 mg/dL   Triglycerides 59.5  0.0 - 149.0 mg/dL   HDL 63.87 (*) >56.43 mg/dL   VLDL 32.9  0.0 - 51.8 mg/dL   LDL Cholesterol 92  0 - 99 mg/dL   Total CHOL/HDL Ratio 6    PSA      Component Value Range   PSA 0.61  0.10 - 4.00 ng/mL  HEPATIC FUNCTION PANEL      Component Value  Range   Total Bilirubin 0.9  0.3 - 1.2 mg/dL   Bilirubin, Direct 0.1  0.0 - 0.3 mg/dL   Alkaline Phosphatase 45  39 - 117 U/L   AST 23  0 - 37 U/L   ALT 30  0 - 53 U/L   Total Protein 7.2  6.0 - 8.3 g/dL   Albumin 3.7  3.5 - 5.2 g/dL     Patient Active Problem List  Diagnosis  . Morbid obesity with BMI of 40.0-44.9, adult  . DEPRESSION  . ATRIAL FIBRILLATION, PAROXYSMAL  . DIVERTICULITIS, COLON  . ARTHRITIS  . Erectile dysfunction  . Gross hematuria  . Basal cell carcinoma  . Classical migraine    Past Medical History  Diagnosis Date  . DIVERTICULITIS, COLON 06/07/2009  . DEPRESSION 06/07/2009  . CHICKENPOX, HX OF 06/07/2009  . ARTHRITIS 06/07/2009  . ATRIAL FIBRILLATION, PAROXYSMAL 06/07/2009  . Hematuria   . Classical migraine 03/03/2012    No past surgical history on file.  History  Substance Use Topics  . Smoking status: Never Smoker   . Smokeless tobacco: Current User    Types: Chew     Comment: occasionally  . Alcohol Use: Yes     Comment: rare    Family History  Problem Relation Age of Onset  . Arthritis    . Diabetes      1st degree relative  . Stroke      F 1st degree relative <60; M 1st degree relative <50    Allergies    Allergen Reactions  . Penicillins     Medication list has been reviewed and updated.  Outpatient Prescriptions Prior to Visit  Medication Sig Dispense Refill  . ALPRAZolam (XANAX) 1 MG tablet       . aspirin 325 MG tablet Take 325 mg by mouth daily.        Marland Kitchen diltiazem (TIAZAC) 180 MG 24 hr capsule Take 1 capsule (180 mg total) by mouth daily.  90 capsule  3  . flecainide (TAMBOCOR) 100 MG tablet Take 1 tablet (100 mg total) by mouth 2 (two) times daily.  180 tablet  3  . furosemide (LASIX) 20 MG tablet TAKE ONE TABLET BY MOUTH IN THE MORNING AS NEEDED FOR SWELLING.  30 tablet  12  . omeprazole (PRILOSEC) 20 MG capsule Take 20 mg by mouth daily.       . SUMAtriptan (IMITREX) 50 MG tablet Take 1 tablet (50 mg total) by mouth every 2 (two) hours as needed for migraine.  10 tablet  3   Last reviewed on 11/15/2012  8:26 AM by Consuello Masse, CMA  Review of Systems:   General: acute fever, chills, achiness, Tmax of 103 - went to UC and placed on Biaxin, no flu test done Eyes: Denies blurring,significant itching ENT: Denies earache, sore throat, and hoarseness. Cardiovascular: Denies chest pains, palpitations, dyspnea on exertion Respiratory: Denies cough, dyspnea at rest,wheeezing Breast: no concerns about lumps GI: Denies nausea, vomiting, diarrhea, constipation, change in bowel habits, abdominal pain, melena, hematochezia GU: Denies penile discharge, ED, urinary flow / outflow problems. No STD concerns. Musculoskeletal: Denies back pain, joint pain Derm: Denies rash, itching Neuro: Denies  paresthesias, frequent falls, frequent headaches Psych: Denies depression, anxiety Endocrine: Denies cold intolerance, heat intolerance, polydipsia Heme: Denies enlarged lymph nodes Allergy: No hayfever   Physical Examination: BP 120/72  Pulse 80  Temp 98.3 F (36.8 C) (Oral)  Ht 6\' 1"  (1.854 m)  Wt 311 lb 12 oz (  141.409 kg)  BMI 41.13 kg/m2  SpO2 95%  Ideal Body Weight: Weight in  (lb) to have BMI = 25: 189.1    Wt Readings from Last 3 Encounters:  11/15/12 311 lb 12 oz (141.409 kg)  11/08/12 319 lb (144.697 kg)  05/26/12 319 lb (144.697 kg)    GEN: well developed, well nourished, no acute distress Eyes: conjunctiva and lids normal, PERRLA, EOMI ENT: TM clear, nares clear, oral exam WNL Neck: supple, no lymphadenopathy, no thyromegaly, no JVD Pulm: diffuse scattered rhonchi without crackles and no wheezing. CV: regular rate and rhythm, S1-S2, no murmur, rub or gallop, no bruits, peripheral pulses normal and symmetric, no cyanosis, clubbing, edema or varicosities Chest: no scars, masses GI: soft, non-tender; no hepatosplenomegaly, masses; active bowel sounds all quadrants GU: no hernia, testicular mass, penile discharge, or prostate enlargement Lymph: no cervical, axillary or inguinal adenopathy MSK: gait normal, muscle tone and strength WNL, no joint swelling, effusions, discoloration, crepitus  SKIN: clear, good turgor, color WNL, no rashes, lesions, or ulcerations Neuro: normal mental status, normal strength, sensation, and motion Psych: alert; oriented to person, place and time, normally interactive and not anxious or depressed in appearance.   Assessment and Plan:  1. Routine general medical examination at a health care facility    The patient's preventative maintenance and recommended screening tests for an annual wellness exam were reviewed in full today. Brought up to date unless services declined.  Counselled on the importance of diet, exercise, and its role in overall health and mortality. The patient's FH and SH was reviewed, including their home life, tobacco status, and drug and alcohol status.   D/c biaxin suspect influenza strongly, given hycodan prn.  Orders Today:  No orders of the defined types were placed in this encounter.    Updated Medication List: (Includes new medications, updates to list, dose adjustments) Meds ordered this  encounter  Medications  . clarithromycin (BIAXIN) 500 MG tablet    Sig: Take 1 tablet by mouth 2 (two) times daily.  Marland Kitchen guaiFENesin (MUCINEX) 600 MG 12 hr tablet    Sig: Take 1,200 mg by mouth 2 (two) times daily.  Marland Kitchen albuterol (PROVENTIL HFA;VENTOLIN HFA) 108 (90 BASE) MCG/ACT inhaler    Sig: Inhale 2 puffs into the lungs every 6 (six) hours as needed.  Marland Kitchen HYDROcodone-homatropine (HYCODAN) 5-1.5 MG/5ML syrup    Sig: 1-2 tsp po qhs prn cough    Dispense:  240 mL    Refill:  0    Medications Discontinued: There are no discontinued medications.   Hannah Beat, MD

## 2012-11-18 ENCOUNTER — Telehealth: Payer: Self-pay | Admitting: Cardiology

## 2012-11-18 ENCOUNTER — Encounter: Payer: Self-pay | Admitting: Cardiology

## 2012-12-07 ENCOUNTER — Ambulatory Visit: Payer: BC Managed Care – PPO | Admitting: Cardiology

## 2012-12-26 ENCOUNTER — Other Ambulatory Visit: Payer: Self-pay | Admitting: Cardiology

## 2013-01-31 ENCOUNTER — Encounter: Payer: Self-pay | Admitting: Cardiology

## 2013-01-31 ENCOUNTER — Ambulatory Visit (INDEPENDENT_AMBULATORY_CARE_PROVIDER_SITE_OTHER): Payer: BC Managed Care – PPO | Admitting: Cardiology

## 2013-01-31 VITALS — BP 126/82 | HR 58 | Ht 73.0 in | Wt 318.0 lb

## 2013-01-31 DIAGNOSIS — I4891 Unspecified atrial fibrillation: Secondary | ICD-10-CM

## 2013-01-31 NOTE — Progress Notes (Signed)
HPI [name] returns for his history, evaluation and management of paroxysmal A. Fib. He is doing well on flecainide without recurrence.  Laboratory data from his primary care reviewed including an outstanding lipid profile other than a low HDL.    Past Medical History  Diagnosis Date  . DIVERTICULITIS, COLON 06/07/2009  . DEPRESSION 06/07/2009  . CHICKENPOX, HX OF 06/07/2009  . ARTHRITIS 06/07/2009  . ATRIAL FIBRILLATION, PAROXYSMAL 06/07/2009  . Hematuria   . Classical migraine 03/03/2012    Current Outpatient Prescriptions  Medication Sig Dispense Refill  . aspirin 325 MG tablet Take 325 mg by mouth daily.        Marland Kitchen diltiazem (TIAZAC) 180 MG 24 hr capsule Take 1 capsule (180 mg total) by mouth daily.  90 capsule  3  . flecainide (TAMBOCOR) 100 MG tablet TAKE ONE TABLET BY MOUTH TWICE DAILY  180 tablet  0  . furosemide (LASIX) 20 MG tablet TAKE ONE TABLET BY MOUTH IN THE MORNING AS NEEDED FOR SWELLING.  30 tablet  12  . omeprazole (PRILOSEC) 20 MG capsule Take 20 mg by mouth daily.       . SUMAtriptan (IMITREX) 50 MG tablet Take 1 tablet (50 mg total) by mouth every 2 (two) hours as needed for migraine.  10 tablet  3   No current facility-administered medications for this visit.    Allergies  Allergen Reactions  . Biaxin (Clarithromycin)     Pt has A-Fib  . Penicillins     Family History  Problem Relation Age of Onset  . Arthritis    . Diabetes      1st degree relative  . Stroke      F 1st degree relative <60; M 1st degree relative <50    History   Social History  . Marital Status: Single    Spouse Name: N/A    Number of Children: N/A  . Years of Education: N/A   Occupational History  . self employed    Social History Main Topics  . Smoking status: Never Smoker   . Smokeless tobacco: Current User    Types: Chew     Comment: occasionally  . Alcohol Use: Yes     Comment: rare  . Drug Use: No  . Sexually Active: Not on file   Other Topics Concern  . Not on  file   Social History Narrative   Does not do regular exercise. Former dip - quit about 1 year ago.     ROS ALL NEGATIVE EXCEPT THOSE NOTED IN HPI  PE  General Appearance: well developed, well nourished in no acute distress, muscular, obese HEENT: symmetrical face, PERRLA, good dentition  Neck: no JVD, thyromegaly, or adenopathy, trachea midline Chest: symmetric without deformity Cardiac: PMI non-displaced, RRR, normal S1, S2, no gallop or murmur Lung: clear to ausculation and percussion Vascular: all pulses full without bruits  Abdominal: nondistended, nontender, good bowel sounds, no HSM, no bruits Extremities: no cyanosis, clubbing or edema, no sign of DVT, no varicosities  Skin: normal color, no rashes Neuro: alert and oriented x 3, non-focal Pysch: normal affect  EKG Sinus bradycardia, normal PR QRS and QTC intervals.  BMET    Component Value Date/Time   NA 138 11/08/2012 0910   K 4.6 11/08/2012 0910   CL 105 11/08/2012 0910   CO2 26 11/08/2012 0910   GLUCOSE 89 11/08/2012 0910   BUN 13 11/08/2012 0910   CREATININE 0.9 11/08/2012 0910   CALCIUM 9.2 11/08/2012 0910  GFRNONAA >60 03/23/2011 1546   GFRAA  Value: >60        The eGFR has been calculated using the MDRD equation. This calculation has not been validated in all clinical situations. eGFR's persistently <60 mL/min signify possible Chronic Kidney Disease. 03/23/2011 1546    Lipid Panel     Component Value Date/Time   CHOL 136 11/08/2012 0910   TRIG 99.0 11/08/2012 0910   HDL 24.70* 11/08/2012 0910   CHOLHDL 6 11/08/2012 0910   VLDL 19.8 11/08/2012 0910   LDLCALC 92 11/08/2012 0910    CBC    Component Value Date/Time   WBC 7.2 11/08/2012 0910   RBC 5.30 11/08/2012 0910   HGB 15.4 11/08/2012 0910   HCT 45.5 11/08/2012 0910   PLT 241.0 11/08/2012 0910   MCV 85.9 11/08/2012 0910   MCH 29.5 03/23/2011 1546   MCHC 33.9 11/08/2012 0910   RDW 14.2 11/08/2012 0910   LYMPHSABS 1.7 11/08/2012 0910   MONOABS 0.4 11/08/2012 0910   EOSABS 0.1 11/08/2012  0910   BASOSABS 0.0 11/08/2012 0910

## 2013-01-31 NOTE — Assessment & Plan Note (Signed)
Stable and doing remarkably well on flecainide. Return the office in 6 months.

## 2013-01-31 NOTE — Patient Instructions (Addendum)
**Note De-identified  Obfuscation** Your physician recommends that you continue on your current medications as directed. Please refer to the Current Medication list given to you today.  Your physician wants you to follow-up in: 6 months. You will receive a reminder letter in the mail two months in advance. If you don't receive a letter, please call our office to schedule the follow-up appointment.  

## 2013-02-16 ENCOUNTER — Encounter: Payer: Self-pay | Admitting: Family Medicine

## 2013-02-16 ENCOUNTER — Ambulatory Visit (INDEPENDENT_AMBULATORY_CARE_PROVIDER_SITE_OTHER): Payer: BC Managed Care – PPO | Admitting: Family Medicine

## 2013-02-16 VITALS — BP 120/88 | HR 57 | Temp 98.1°F | Ht 73.0 in | Wt 320.5 lb

## 2013-02-16 DIAGNOSIS — R1310 Dysphagia, unspecified: Secondary | ICD-10-CM

## 2013-02-16 NOTE — Progress Notes (Signed)
Nature conservation officer at [facility] [address] [place] [address] Phone: [phone] Fax: [phone]  Date:  02/16/2013   Name:  Brandon [name]   DOB:  [birth year]   MRN:  [id number] Gender: male Age: 47 y.o.  Primary Physician:  Hannah Beat, MD  Evaluating MD: Hannah Beat, MD   Chief Complaint: something caught in throat   History of Present Illness:  Brandon [name] is a 47 y.o. pleasant patient who presents with the following:  Caught in throat? Former dip for years. Feels like something in throat for a week or so. 30+ history of dipping. 1-2 tins of dip a week. Never a heavy drinker. Never smoked other stuff.   Not much allergies or drainage.   B ant fossa numb, hands intermittently.   Patient Active Problem List  Diagnosis  . Morbid obesity with BMI of 40.0-44.9, adult  . DEPRESSION  . ATRIAL FIBRILLATION, PAROXYSMAL  . DIVERTICULITIS, COLON  . ARTHRITIS  . Erectile dysfunction  . Gross hematuria  . Basal cell carcinoma  . Classical migraine    Past Medical History  Diagnosis Date  . DIVERTICULITIS, COLON 06/07/2009  . DEPRESSION 06/07/2009  . CHICKENPOX, HX OF 06/07/2009  . ARTHRITIS 06/07/2009  . ATRIAL FIBRILLATION, PAROXYSMAL 06/07/2009  . Hematuria   . Classical migraine 03/03/2012    No past surgical history on file.  History   Social History  . Marital Status: Single    Spouse Name: N/A    Number of Children: N/A  . Years of Education: N/A   Occupational History  . self employed    Social History Main Topics  . Smoking status: Never Smoker   . Smokeless tobacco: Current User    Types: Chew     Comment: occasionally  . Alcohol Use: Yes     Comment: rare  . Drug Use: No  . Sexually Active: Not on file   Other Topics Concern  . Not on file   Social History Narrative   Does not do regular exercise. Former dip - quit about 1 year ago.     Family History  Problem Relation Age of Onset  . Arthritis    . Diabetes       1st degree relative  . Stroke      F 1st degree relative <60; M 1st degree relative <50    Allergies  Allergen Reactions  . Biaxin (Clarithromycin)     Pt has history of arrhythmias  . Penicillins     Medication list has been reviewed and updated.  Outpatient Prescriptions Prior to Visit  Medication Sig Dispense Refill  . aspirin 325 MG tablet Take 325 mg by mouth daily.        Marland Kitchen diltiazem (TIAZAC) 180 MG 24 hr capsule Take 1 capsule (180 mg total) by mouth daily.  90 capsule  3  . flecainide (TAMBOCOR) 100 MG tablet TAKE ONE TABLET BY MOUTH TWICE DAILY  180 tablet  0  . furosemide (LASIX) 20 MG tablet TAKE ONE TABLET BY MOUTH IN THE MORNING AS NEEDED FOR SWELLING.  30 tablet  12  . omeprazole (PRILOSEC) 20 MG capsule Take 20 mg by mouth daily.       . SUMAtriptan (IMITREX) 50 MG tablet Take 1 tablet (50 mg total) by mouth every 2 (two) hours as needed for migraine.  10 tablet  3   No facility-administered medications prior to visit.    Review of Systems:  As above, shoulder  pain, tingling B hands. Back pain.  Physical Examination: BP 120/88  Pulse 57  Temp(Src) 98.1 F (36.7 C) (Oral)  Ht 6\' 1"  (1.854 m)  Wt 320 lb 8 oz (145.378 kg)  BMI 42.29 kg/m2  SpO2 98%  Ideal Body Weight: Weight in (lb) to have BMI = 25: 189.1   GEN: WDWN, NAD, Non-toxic, Alert & Oriented x 3 HEENT: Atraumatic, Normocephalic.  Ears and Nose: No external deformity. EXTR: No clubbing/cyanosis/edema NEURO: Normal gait.  PSYCH: Normally interactive. Conversant. Not depressed or anxious appearing.  Calm demeanor.  Wrist, full ROM, NT, tinnel's and phalen's are negative.  CERVICAL SPINE EXAM Range of motion: Flexion, extension, lateral bending, and rotation: full Pain with terminal motion: mild C5-T1 intact, sensation and motor   Assessment and Plan:  Dysphagia, unspecified - Plan: Ambulatory referral to ENT  Concern for "something in throat" that the patient persistently can feel.  He has long history of smokeless tobacco abuse. Throat eval needed.  We discussed various potential work-ups for neuropathic changes in extremities, and he wants to hold right now.  Orders Today:  Orders Placed This Encounter  Procedures  . Ambulatory referral to ENT    Referral Priority:  Routine    Referral Type:  Consultation    Referral Reason:  Specialty Services Required    Requested Specialty:  Otolaryngology    Number of Visits Requested:  1    Updated Medication List: (Includes new medications, updates to list, dose adjustments) No orders of the defined types were placed in this encounter.    Medications Discontinued: There are no discontinued medications.    Signed, Elpidio Galea. Courtney Fenlon, MD 02/16/2013 10:04 AM

## 2013-02-16 NOTE — Patient Instructions (Signed)
REFERRAL: GO THE THE FRONT ROOM AT THE ENTRANCE OF OUR CLINIC, NEAR CHECK IN. ASK FOR Brandon [name]. SHE WILL HELP YOU SET UP YOUR REFERRAL. DATE: TIME:  

## 2013-02-26 ENCOUNTER — Emergency Department: Payer: Self-pay | Admitting: Emergency Medicine

## 2013-03-20 ENCOUNTER — Other Ambulatory Visit: Payer: Self-pay | Admitting: Cardiology

## 2013-03-21 ENCOUNTER — Telehealth: Payer: Self-pay | Admitting: Cardiology

## 2013-04-18 ENCOUNTER — Encounter: Payer: Self-pay | Admitting: Family Medicine

## 2013-04-18 ENCOUNTER — Ambulatory Visit (INDEPENDENT_AMBULATORY_CARE_PROVIDER_SITE_OTHER): Payer: BC Managed Care – PPO | Admitting: Family Medicine

## 2013-04-18 ENCOUNTER — Ambulatory Visit (INDEPENDENT_AMBULATORY_CARE_PROVIDER_SITE_OTHER)
Admission: RE | Admit: 2013-04-18 | Discharge: 2013-04-18 | Disposition: A | Discharge status: Home / Self Care | Payer: BC Managed Care – PPO | Source: Ambulatory Visit | Attending: Family Medicine | Admitting: Family Medicine

## 2013-04-18 VITALS — BP 130/82 | HR 74 | Temp 98.5°F | Ht 73.0 in | Wt 324.2 lb

## 2013-04-18 DIAGNOSIS — R5383 Other fatigue: Secondary | ICD-10-CM

## 2013-04-18 DIAGNOSIS — M5412 Radiculopathy, cervical region: Secondary | ICD-10-CM

## 2013-04-18 DIAGNOSIS — R5381 Other malaise: Secondary | ICD-10-CM

## 2013-04-18 DIAGNOSIS — M5010 Cervical disc disorder with radiculopathy, unspecified cervical region: Secondary | ICD-10-CM

## 2013-04-18 DIAGNOSIS — W57XXXA Bitten or stung by nonvenomous insect and other nonvenomous arthropods, initial encounter: Secondary | ICD-10-CM

## 2013-04-18 DIAGNOSIS — R209 Unspecified disturbances of skin sensation: Secondary | ICD-10-CM

## 2013-04-18 DIAGNOSIS — R200 Anesthesia of skin: Secondary | ICD-10-CM

## 2013-04-18 NOTE — Progress Notes (Signed)
Nature conservation officer at [facility] [address] [place] [address] Phone: [phone] Fax: [phone]  Date:  04/18/2013   Name:  Brandon [name]   DOB:  1966-06-02   MRN:  [id number] Gender: male Age: 47 y.o.  Primary Physician:  Hannah Beat, MD  Evaluating MD: Hannah Beat, MD   Chief Complaint: tingling in hand and Arm Pain   History of Present Illness:  Brandon [name] is a 47 y.o. pleasant patient who presents with the following:  47 yo patient, 324 lbs presents with multiple complaints including B hand tingling, myalgias, weakness, fatigue, and overall feeling unwell, as well as a recent tick bite.   Having trouble with hands, but on both bilterally. Hurting both so bad, hurting in the elbow. Biceps. Tingling in the fingers, more 1-3 distribution, R > L. Not sure about weakness. Longstanding > 1 year.  Also feels maybe swimmy headed and will go and come. On and off x 1 week. No fever.  ? Nausea.  Nose bleed some.  No ear ache.  Pulled a tick off about a week or so ago. No systemic rash.  Hands biggest concern. Has been getting worse over time.   Patient Active Problem List   Diagnosis Date Noted  . 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 Gross hematuria 06/25/2011  . Basal cell carcinoma 06/25/2011  . Erectile dysfunction 06/09/2011  . Morbid obesity with BMI of 40.0-44.9, adult 06/07/2009  . DEPRESSION 06/07/2009  . ATRIAL FIBRILLATION, PAROXYSMAL 06/07/2009  . DIVERTICULITIS, COLON 06/07/2009  . ARTHRITIS 06/07/2009    Past Medical History  Diagnosis Date  . DIVERTICULITIS, COLON 06/07/2009  . DEPRESSION 06/07/2009  . CHICKENPOX, HX OF 06/07/2009  . ARTHRITIS 06/07/2009  . ATRIAL FIBRILLATION, PAROXYSMAL 06/07/2009  . Hematuria   . Classical migraine 03/03/2012    No past surgical history on file.  History   Social History  . Marital Status: Single    Spouse Name: N/A    Number of Children: N/A  . Years of Education: N/A   Occupational  History  . self employed    Social History Main Topics  . Smoking status: Never Smoker   . Smokeless tobacco: Current User    Types: Chew     Comment: occasionally  . Alcohol Use: Yes     Comment: rare  . Drug Use: No  . Sexually Active: Not on file   Other Topics Concern  . Not on file   Social History Narrative   Does not do regular exercise. Former dip - quit about 1 year ago.     Family History  Problem Relation Age of Onset  . Arthritis    . Diabetes      1st degree relative  . Stroke      F 1st degree relative <60; M 1st degree relative <50    Allergies  Allergen Reactions  . Biaxin (Clarithromycin)     Pt has history of arrhythmias  . Penicillins     Medication list has been reviewed and updated.  Outpatient Prescriptions Prior to Visit  Medication Sig Dispense Refill  . aspirin 325 MG tablet Take 325 mg by mouth daily.        Marland Kitchen diltiazem (TIAZAC) 180 MG 24 hr capsule Take 1 capsule (180 mg total) by mouth daily.  90 capsule  3  . flecainide (TAMBOCOR) 100 MG tablet TAKE ONE TABLET BY MOUTH TWICE DAILY  180 tablet  0  . omeprazole (PRILOSEC) 20 MG  capsule Take 20 mg by mouth daily.       . SUMAtriptan (IMITREX) 50 MG tablet Take 1 tablet (50 mg total) by mouth every 2 (two) hours as needed for migraine.  10 tablet  3  . furosemide (LASIX) 20 MG tablet TAKE ONE TABLET BY MOUTH IN THE MORNING AS NEEDED FOR SWELLING.  30 tablet  12   No facility-administered medications prior to visit.    Review of Systems:  As above. No fevers or chills, no chest pain, no arrythmias.  Physical Examination: BP 130/82  Pulse 74  Temp(Src) 98.5 F (36.9 C) (Oral)  Ht 6\' 1"  (1.854 m)  Wt 324 lb 4 oz (147.079 kg)  BMI 42.79 kg/m2  SpO2 97%  Ideal Body Weight: Weight in (lb) to have BMI = 25: 189.1   GEN: WDWN, NAD, Non-toxic, A & O x 3 HEENT: Atraumatic, Normocephalic. Neck supple. No masses, No LAD. Ears and Nose: No external deformity. CV: RRR, No M/G/R. No JVD.  No thrill. No extra heart sounds. PULM: CTA B, no wheezes, crackles, rhonchi. No retractions. No resp. distress. No accessory muscle use. EXTR: No c/c/e NEURO Normal gait.  PSYCH: Normally interactive. Conversant. Not depressed or anxious appearing.  Calm demeanor.    CERVICAL SPINE EXAM Range of motion: Flexion, extension, lateral bending, and rotation: full Pain with terminal motion: + with flexion Spinous Processes: NT SCM: NT Upper paracervical muscles: posterior ttp Upper traps: mild C5-T1 intact, sensation and motor UE str 5/5 all  B hand Ecchymosis or edema: neg ROM wrist/hand/digits: full  Carpals, MCP's, digits: NT Distal Ulna and Radius: NT Ecchymosis or edema: neg No instability Cysts/nodules: neg Digit triggering: neg Finkelstein's test: neg Snuffbox tenderness: neg Scaphoid tubercle: NT Resisted supination: NT Full composite fist, no malrotation Grip, all digits: 5/5 str DIPJT: NT PIP JT: NT MCP JT: NT No tenosynovitis Axial load test: neg Phalen's: Pos on the R Tinel's: neg Atrophy: neg  Hand sensation: intact  Dg Cervical Spine Complete  04/18/2013   *RADIOLOGY REPORT*  Clinical Data: Neck pain.  CERVICAL SPINE - COMPLETE 4+ VIEW  Comparison: None.  Findings: Normal alignment.  Prevertebral soft tissues are normal. Slight disc space narrowing at C6-7.  No fracture.  IMPRESSION: Slight disc space narrowing at C6-7.  No acute bony abnormality.   Original Report Authenticated By: Charlett Nose, M.D.    Assessment and Plan:  Cervical disc disorder with radiculopathy of cervical region - Plan: MR Cervical Spine Wo Contrast, DG Cervical Spine Complete  Tick bite - Plan: Lyme disease dna by pcr(borrelia burg), Ehrlichia Antibody Panel  Other malaise and fatigue  Numbness of extremity  Numbness in hands > 1 year and worsening and impairing, R and L. Unclear is from a spinal origin or a peripheral nerve origin. Minimal provocation at wrist and elbow suggests  higher origin. Obtain an MRI of the cervical spine without contrast to evaluate for nerve compression or cord compression. Given Valium 5 mg, take 1 prior to MRI. #1, 0 ref.  Fatigue has been worked-up in detail before. Recent tick bite, we should check lyme and ehrlichia titers. On at least > 24 hours, but he is not fully sure.  Orders Today:  Orders Placed This Encounter  Procedures  . MR Cervical Spine Wo Contrast    WT-324/PT IS CLAUS/MEDS/DRIVER/PREV IN EPIC/NO NEEDS PER OFFICE/EPIC ORDER/[facility]    Standing Status: Future     Number of Occurrences:      Standing Expiration Date: 06/18/2014  Order Specific Question:  Does the patient have a pacemaker, internal devices, implants, aneury    Answer:  No    Order Specific Question:  Preferred imaging location?    Answer:  [facility]    Order Specific Question:  Reason for exam:    Answer:  cervical radiculopathy  . DG Cervical Spine Complete    Standing Status: Future     Number of Occurrences: 1     Standing Expiration Date: 06/18/2014    Order Specific Question:  Preferred imaging location?    Answer:  [facility]    Order Specific Question:  Reason for exam:    Answer:  neck pain  . Lyme disease dna by pcr(borrelia burg)  . Ehrlichia Antibody Panel    Updated Medication List: (Includes new medications, updates to list, dose adjustments) No orders of the defined types were placed in this encounter.    Medications Discontinued: Medications Discontinued During This Encounter  Medication Reason  . furosemide (LASIX) 20 MG tablet Error      Signed, Raeann Offner T. Calden Dorsey, MD 04/18/2013 3:27 PM

## 2013-04-21 ENCOUNTER — Other Ambulatory Visit: Payer: Self-pay | Admitting: Family Medicine

## 2013-04-21 DIAGNOSIS — Z139 Encounter for screening, unspecified: Secondary | ICD-10-CM

## 2013-04-22 ENCOUNTER — Ambulatory Visit
Admission: RE | Admit: 2013-04-22 | Discharge: 2013-04-22 | Disposition: A | Discharge status: Home / Self Care | Payer: BC Managed Care – PPO | Source: Ambulatory Visit | Attending: Family Medicine | Admitting: Family Medicine

## 2013-04-22 DIAGNOSIS — Z139 Encounter for screening, unspecified: Secondary | ICD-10-CM

## 2013-04-22 LAB — EHRLICHIA ANTIBODY PANEL: E chaffeensis (HGE) Ab, IgG: <1:64 {titer}

## 2013-04-24 ENCOUNTER — Ambulatory Visit
Admission: RE | Admit: 2013-04-24 | Discharge: 2013-04-24 | Disposition: A | Discharge status: Home / Self Care | Payer: BC Managed Care – PPO | Source: Ambulatory Visit | Attending: Family Medicine | Admitting: Family Medicine

## 2013-04-24 DIAGNOSIS — M5010 Cervical disc disorder with radiculopathy, unspecified cervical region: Secondary | ICD-10-CM

## 2013-04-28 NOTE — Progress Notes (Signed)
Quick Note:  Patient advised and would like referral and wants to go to  ______

## 2013-05-03 ENCOUNTER — Telehealth: Payer: Self-pay | Admitting: *Deleted

## 2013-05-03 DIAGNOSIS — M542 Cervicalgia: Secondary | ICD-10-CM

## 2013-05-03 NOTE — Telephone Encounter (Signed)
Orders only

## 2013-05-11 ENCOUNTER — Other Ambulatory Visit: Payer: Self-pay | Admitting: Cardiology

## 2013-05-12 ENCOUNTER — Other Ambulatory Visit: Payer: Self-pay | Admitting: *Deleted

## 2013-05-12 MED ORDER — DILTIAZEM HCL ER BEADS 180 MG PO CP24: 180.0000 mg | ORAL_CAPSULE | Freq: Every day | ORAL | Status: DC

## 2013-06-16 ENCOUNTER — Ambulatory Visit (INDEPENDENT_AMBULATORY_CARE_PROVIDER_SITE_OTHER): Payer: BC Managed Care – PPO | Admitting: Cardiovascular Disease

## 2013-06-16 ENCOUNTER — Encounter: Payer: Self-pay | Admitting: Cardiovascular Disease

## 2013-06-16 VITALS — BP 132/90 | HR 56 | Ht 74.0 in | Wt 321.5 lb

## 2013-06-16 DIAGNOSIS — I4891 Unspecified atrial fibrillation: Secondary | ICD-10-CM

## 2013-06-16 DIAGNOSIS — Z6841 Body Mass Index (BMI) 40.0 and over, adult: Secondary | ICD-10-CM

## 2013-06-16 DIAGNOSIS — I10 Essential (primary) hypertension: Secondary | ICD-10-CM

## 2013-06-16 MED ORDER — FLECAINIDE ACETATE 100 MG PO TABS: 100.0000 mg | ORAL_TABLET | Freq: Two times a day (BID) | ORAL | Status: DC

## 2013-06-16 NOTE — Assessment & Plan Note (Signed)
We have encouraged continued exercise, careful diet management in an effort to lose weight. 

## 2013-06-16 NOTE — Assessment & Plan Note (Signed)
No recent episodes of arrhythmia. We'll continue current medications. We've asked him to call us if heart rate runs low with associated fatigue. If this occurs, his diltiazem dose could be decreased down to 120 mm daily.

## 2013-06-16 NOTE — Patient Instructions (Addendum)
You are doing well. No medication changes were made.  Please call us if you have new issues that need to be addressed before your next appt.  Your physician wants you to follow-up in: 6 months.  You will receive a reminder letter in the mail two months in advance. If you don't receive a letter, please call our office to schedule the follow-up appointment.   

## 2013-06-16 NOTE — Assessment & Plan Note (Signed)
He reports blood pressures are well controlled at home. Borderline elevated today. Asked him to monitor this at home

## 2013-06-16 NOTE — Progress Notes (Signed)
   Patient ID: Brandon [name], male    DOB: 1966-04-24, 47 y.o.   MRN: [id number]  HPI Comments: Brandon [name] is a 47 year old gentleman with history of hypertension, obesity, chronic back pain, atrial fibrillation who presents for routine followup.  He reports that he has been several years since his last episode of atrial fibrillation. Heart rate does run low but denies any recurrent arrhythmia. Overall he feels well apart from his back. He continues to work on heavy machinery.   Cardiac catheterization may 2012 showed no significant CAD, normal ejection fraction greater than 55%.  EKG shows normal sinus rhythm with rate 56 beats a minute, no significant ST or T wave changes   Past Medical History . DIVERTICULITIS, COLON 06/07/2009 . DEPRESSION 06/07/2009 . CHICKENPOX, HX OF 06/07/2009 . ARTHRITIS 06/07/2009 . ATRIAL FIBRILLATION, PAROXYSMAL 06/07/2009 . Hematuria  . Classical migraine 03/03/2012     Outpatient Encounter Prescriptions as of 06/16/2013  Medication Sig Dispense Refill  . aspirin 325 MG tablet Take 325 mg by mouth daily.        Marland Kitchen diltiazem (TIAZAC) 180 MG 24 hr capsule Take 1 capsule (180 mg total) by mouth daily.  90 capsule  2  . flecainide (TAMBOCOR) 100 MG tablet Take 1 tablet (100 mg total) by mouth 2 (two) times daily.  180 tablet  6  . omeprazole (PRILOSEC) 20 MG capsule Take 20 mg by mouth daily.       . SUMAtriptan (IMITREX) 50 MG tablet Take 1 tablet (50 mg total) by mouth every 2 (two) hours as needed for migraine.  10 tablet  3   Review of Systems  Constitutional: Negative.   HENT: Negative.   Eyes: Negative.   Respiratory: Negative.   Cardiovascular: Negative.   Gastrointestinal: Negative.   Musculoskeletal: Negative.   Skin: Negative.   Neurological: Negative.   Psychiatric/Behavioral: Negative.   All other systems reviewed and are negative.    BP 132/90  Pulse 56  Ht 6' 2"  (1.88 m)  Wt 321 lb 8 oz (145.831 kg)  BMI 41.26 kg/m2  Physical Exam   Nursing note and vitals reviewed. Constitutional: He is oriented to person, place, and time. He appears well-developed and well-nourished.  HENT:  Head: Normocephalic.  Nose: Nose normal.  Mouth/Throat: Oropharynx is clear and moist.  Eyes: Conjunctivae are normal. Pupils are equal, round, and reactive to light.  Neck: Normal range of motion. Neck supple. No JVD present.  Cardiovascular: Normal rate, regular rhythm, S1 normal, S2 normal, normal heart sounds and intact distal pulses.  Exam reveals no gallop and no friction rub.   No murmur heard. Pulmonary/Chest: Effort normal and breath sounds normal. No respiratory distress. He has no wheezes. He has no rales. He exhibits no tenderness.  Abdominal: Soft. Bowel sounds are normal. He exhibits no distension. There is no tenderness.  Musculoskeletal: Normal range of motion. He exhibits no edema and no tenderness.  Lymphadenopathy:    He has no cervical adenopathy.  Neurological: He is alert and oriented to person, place, and time. Coordination normal.  Skin: Skin is warm and dry. No rash noted. No erythema.  Psychiatric: He has a normal mood and affect. His behavior is normal. Judgment and thought content normal.      Assessment and Plan

## 2013-08-24 ENCOUNTER — Ambulatory Visit (INDEPENDENT_AMBULATORY_CARE_PROVIDER_SITE_OTHER): Payer: BC Managed Care – PPO

## 2013-08-24 DIAGNOSIS — Z23 Encounter for immunization: Secondary | ICD-10-CM

## 2013-10-05 ENCOUNTER — Ambulatory Visit (INDEPENDENT_AMBULATORY_CARE_PROVIDER_SITE_OTHER): Payer: BC Managed Care – PPO | Admitting: Family Medicine

## 2013-10-05 ENCOUNTER — Encounter: Payer: Self-pay | Admitting: Family Medicine

## 2013-10-05 VITALS — BP 124/72 | HR 61 | Wt 329.5 lb

## 2013-10-05 DIAGNOSIS — G5601 Carpal tunnel syndrome, right upper limb: Secondary | ICD-10-CM

## 2013-10-05 DIAGNOSIS — G5600 Carpal tunnel syndrome, unspecified upper limb: Secondary | ICD-10-CM | POA: Insufficient documentation

## 2013-10-05 DIAGNOSIS — M5412 Radiculopathy, cervical region: Secondary | ICD-10-CM

## 2013-10-05 DIAGNOSIS — M5010 Cervical disc disorder with radiculopathy, unspecified cervical region: Secondary | ICD-10-CM

## 2013-10-05 HISTORY — DX: Cervical disc disorder with radiculopathy, unspecified cervical region: M50.10

## 2013-10-05 NOTE — Progress Notes (Signed)
Pre-visit discussion using our clinic review tool. No additional management support is needed unless otherwise documented below in the visit note.  

## 2013-10-05 NOTE — Progress Notes (Signed)
Date:  10/05/2013   Name:  Brandon [name]   DOB:  09/03/1966   MRN:  [id number] Gender: male Age: 47 y.o.  Primary Physician:  Hannah Beat, MD   Chief Complaint: Neck Pain   Subjective:   History of Present Illness:  Brandon [name] is a 47 y.o. pleasant patient who presents with the following:  CTS on the R  Cervical disk herniation  Neck is getting difficult to turn, only can turn so far, hurts, pops and and cracks. Now cannot turn to the left without much pain. Will loosen up a little. He did well after having some physical therapy, but ultimately his symptoms returned. I had him see Dr. Ethelene Hal for his opinion, and they talked about potential epidural steroid injections or foraminal injections. [facility] orthopedics notes are reviewed.  Still doing neck HEP, most days of the week.  R numbness and tingling  Mr Cervical Spine Wo Contrast  04/24/2013   *RADIOLOGY REPORT*  Clinical Data: Bilateral arm pain and numbness, right greater than left.  MRI CERVICAL SPINE WITHOUT CONTRAST  Technique:  Multiplanar and multiecho pulse sequences of the cervical spine, to include the craniocervical junction and cervicothoracic junction, were obtained according to standard protocol without intravenous contrast.  Comparison: None.  Findings: The visualized intracranial contents are normal. Cervical spinal cord is normal with no mass lesion or myelopathy or spinal cord compression. Paraspinal soft tissues are normal.  C1-2 and C2-3:  Normal.  C3-4:  Minimal uncinate spurs extending to the right neural foramen without significant foraminal stenosis.  C4-5:  Normal.  C5-6: Small soft disc protrusion and uncinate spurs protruding to the left lateral recess and left neural foramen and could affect the left C6 nerve.  C6-7: Small soft disc protrusion and osteophytes protruding into the right neural foramen which could affect the right C7 nerve. Less prominent disc bulging osteophytes protrude into  the left neural foramen.  C7-T1:  Slight right facet arthritis.  Otherwise, normal.  IMPRESSION:  1. Disc protrusion and and osteophytes to the left at C5-6 which might affect the left C6 nerve. 2.  Soft disc protrusion and osteophytes at C6-7 protrude into the right neural foramen and could affect the right C7 nerve.   Original Report Authenticated By: Francene Boyers, M.D.   Patient Active Problem List   Diagnosis Date Noted  . Cervical disc disorder with radiculopathy of cervical region 10/05/2013  . Carpal tunnel syndrome 10/05/2013  . Essential hypertension 06/16/2013  . 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 Gross hematuria 06/25/2011  . Basal cell carcinoma 06/25/2011  . Erectile dysfunction 06/09/2011  . Morbid obesity with BMI of 40.0-44.9, adult 06/07/2009  . DEPRESSION 06/07/2009  . ATRIAL FIBRILLATION, PAROXYSMAL 06/07/2009  . DIVERTICULITIS, COLON 06/07/2009  . ARTHRITIS 06/07/2009    Past Medical History  Diagnosis Date  . DIVERTICULITIS, COLON 06/07/2009  . DEPRESSION 06/07/2009  . CHICKENPOX, HX OF 06/07/2009  . ARTHRITIS 06/07/2009  . ATRIAL FIBRILLATION, PAROXYSMAL 06/07/2009  . Hematuria   . 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 Cervical disc disorder with radiculopathy of cervical region 10/05/2013    Past Surgical History  Procedure Laterality Date  . Cardiac catheterization    . Tonsillectomy    . Foot surgery      left foot    History   Social History  . Marital Status: Single    Spouse Name: N/A    Number of Children: N/A  . Years of Education: N/A   Occupational History  .  self employed    Social History Main Topics  . Smoking status: Never Smoker   . Smokeless tobacco: Former Neurosurgeon    Quit date: 06/17/2011     Comment: occasionally  . Alcohol Use: No  . Drug Use: No  . Sexual Activity: Not on file   Other Topics Concern  . Not on file   Social History Narrative   Does not do regular exercise. Former dip - quit about 1 year ago.     Family History    Problem Relation Age of Onset  . Arthritis    . Diabetes      1st degree relative  . Stroke      F 1st degree relative <60; M 1st degree relative <50  . Stroke Father     Allergies  Allergen Reactions  . Biaxin [Clarithromycin]     Pt has history of arrhythmias  . Penicillins     Medication list has been reviewed and updated.  Review of Systems:  GEN: No fevers, chills. Nontoxic. Primarily MSK c/o today. MSK: Detailed in the HPI GI: tolerating PO intake without difficulty Neuro: as above Otherwise the pertinent positives of the ROS are noted above.   Objective:   Physical Examination: BP 124/72  Pulse 61  Wt 329 lb 8 oz (149.46 kg)  SpO2 96%  Ideal Body Weight:     GEN: Well-developed,well-nourished,in no acute distress; alert,appropriate and cooperative throughout examination HEENT: Normocephalic and atraumatic without obvious abnormalities. Ears, externally no deformities PULM: Breathing comfortably in no respiratory distress EXT: No clubbing, cyanosis, or edema PSYCH: Normally interactive. Cooperative during the interview. Pleasant. Friendly and conversant. Not anxious or depressed appearing. Normal, full affect.  CERVICAL SPINE EXAM Range of motion: Flexion, extension, lateral bending, and rotation: Relatively loss of approximately 25 turning left compared to the right, which is also slightly diminished compared to expected. Minimal to mild loss of motion and flexion and extension. Pain with terminal motion: yes Spinous Processes: NT SCM: NT Upper paracervical muscles: mild T Upper traps: NT C5-T1 intact, sensation and motor   No results found.  Assessment & Plan:    Cervical disc disorder with radiculopathy of cervical region  Carpal tunnel syndrome, right Talked about his various options, and he was confused about the anatomy regarding cervical disc herniation and osteophyte potentially pressing and out on his C7 nerve root, which is I think the  more likely cause of his pain. He does certainly also have carpal tunnel syndrome on nerve conduction study.  He wanted to know his options. I think that he did reasonably take some Neurontin or Lyrica, but he does not want to take any medications daily.  Barring this, I suggested he may want to proceed with the epidural steroid injections or procedures at Dr. Ethelene Hal and he were discussing. He said he will call them today.  Signed,  Elpidio Galea. Laneice Meneely, MD, CAQ Sports Medicine  [facility] [address] [place] [address] Phone: [phone] Fax: [phone]  Updated Complete Medication List:   Medication List       This list is accurate as of: 10/05/13 10:27 AM.  Always use your most recent med list.               aspirin 325 MG tablet  Take 325 mg by mouth daily.     diltiazem 180 MG 24 hr capsule  Commonly known as:  TIAZAC  Take 1 capsule (180 mg total) by mouth  daily.     flecainide 100 MG tablet  Commonly known as:  TAMBOCOR  Take 1 tablet (100 mg total) by mouth 2 (two) times daily.     omeprazole 20 MG capsule  Commonly known as:  PRILOSEC  Take 20 mg by mouth daily.     SUMAtriptan 50 MG tablet  Commonly known as:  IMITREX  Take 1 tablet (50 mg total) by mouth every 2 (two) hours as needed for migraine.

## 2013-10-21 ENCOUNTER — Encounter: Payer: Self-pay | Admitting: Family Medicine

## 2013-12-14 ENCOUNTER — Ambulatory Visit (INDEPENDENT_AMBULATORY_CARE_PROVIDER_SITE_OTHER): Payer: BC Managed Care – PPO | Admitting: Cardiovascular Disease

## 2013-12-14 ENCOUNTER — Encounter: Payer: Self-pay | Admitting: Cardiovascular Disease

## 2013-12-14 VITALS — BP 122/80 | HR 62 | Ht 74.0 in | Wt 330.5 lb

## 2013-12-14 DIAGNOSIS — Z6841 Body Mass Index (BMI) 40.0 and over, adult: Secondary | ICD-10-CM

## 2013-12-14 DIAGNOSIS — I10 Essential (primary) hypertension: Secondary | ICD-10-CM

## 2013-12-14 DIAGNOSIS — I4891 Unspecified atrial fibrillation: Secondary | ICD-10-CM

## 2013-12-14 NOTE — Assessment & Plan Note (Signed)
Blood pressure is well controlled on today's visit. No changes made to the medications. 

## 2013-12-14 NOTE — Progress Notes (Signed)
   Patient ID: Brandon [name], male    DOB: 09/15/1966, 48 y.o.   MRN: [id number]  HPI Comments: [name] is a 48 year old gentleman with history of hypertension, obesity, chronic back pain, atrial fibrillation who presents for routine followup.  He reports that he has been several years since his last episode of atrial fibrillation.  Overall he feels well apart from his back and neck. He continues to work on heavy machinery. His weight continues to climb. He reports having more trouble with his knees. Has snoring, uncertain if he has obstructive sleep apnea. 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 Now doing physical therapy at [facility] physical therapy  Cardiac catheterization may 2012 showed no significant CAD, normal ejection fraction greater than 55%. Cholesterol well controlled without any cluster medication  EKG shows normal sinus rhythm with rate 62 beats a minute, no significant ST or T wave changes   Past Medical History . DIVERTICULITIS, COLON 06/07/2009 . DEPRESSION 06/07/2009 . CHICKENPOX, HX OF 06/07/2009 . ARTHRITIS 06/07/2009 . ATRIAL FIBRILLATION, PAROXYSMAL 06/07/2009 . Hematuria  . Classical migraine 03/03/2012     Outpatient Encounter Prescriptions as of 12/14/2013  Medication Sig  . aspirin 325 MG tablet Take 325 mg by mouth daily.    Marland Kitchen diltiazem (TIAZAC) 180 MG 24 hr capsule Take 1 capsule (180 mg total) by mouth daily.  . flecainide (TAMBOCOR) 100 MG tablet Take 1 tablet (100 mg total) by mouth 2 (two) times daily.  Marland Kitchen omeprazole (PRILOSEC) 20 MG capsule Take 20 mg by mouth daily.   . SUMAtriptan (IMITREX) 50 MG tablet Take 1 tablet (50 mg total) by mouth every 2 (two) hours as needed for migraine.    Review of Systems  Constitutional: Negative.   HENT: Negative.   Eyes: Negative.   Respiratory: Negative.   Cardiovascular: Negative.   Gastrointestinal: Negative.   Endocrine: Negative.   Musculoskeletal: Negative.   Skin: Negative.    Allergic/Immunologic: Negative.   Neurological: Negative.   Hematological: Negative.   Psychiatric/Behavioral: Negative.   All other systems reviewed and are negative.   BP 122/80  Pulse 62  Ht 6' 2"  (1.88 m)  Wt 330 lb 8 oz (149.914 kg)  BMI 42.42 kg/m2  Physical Exam  Nursing note and vitals reviewed. Constitutional: He is oriented to person, place, and time. He appears well-developed and well-nourished.  HENT:  Head: Normocephalic.  Nose: Nose normal.  Mouth/Throat: Oropharynx is clear and moist.  Eyes: Conjunctivae are normal. Pupils are equal, round, and reactive to light.  Neck: Normal range of motion. Neck supple. No JVD present.  Cardiovascular: Normal rate, regular rhythm, S1 normal, S2 normal, normal heart sounds and intact distal pulses.  Exam reveals no gallop and no friction rub.   No murmur heard. Pulmonary/Chest: Effort normal and breath sounds normal. No respiratory distress. He has no wheezes. He has no rales. He exhibits no tenderness.  Abdominal: Soft. Bowel sounds are normal. He exhibits no distension. There is no tenderness.  Musculoskeletal: Normal range of motion. He exhibits no edema and no tenderness.  Lymphadenopathy:    He has no cervical adenopathy.  Neurological: He is alert and oriented to person, place, and time. Coordination normal.  Skin: Skin is warm and dry. No rash noted. No erythema.  Psychiatric: He has a normal mood and affect. His behavior is normal. Judgment and thought content normal.      Assessment and Plan

## 2013-12-14 NOTE — Patient Instructions (Signed)
You are doing well. No medication changes were made.  Please call us if you have new issues that need to be addressed before your next appt.  Your physician wants you to follow-up in: 12 months.  You will receive a reminder letter in the mail two months in advance. If you don't receive a letter, please call our office to schedule the follow-up appointment. 

## 2013-12-14 NOTE — Assessment & Plan Note (Addendum)
We have encouraged continued exercise, careful diet management in an effort to lose weight. Suspect he has obstructive sleep apnea. He is uncertain whether he would like sleep study. Suggested he discuss this with Dr. Lorelei Pont.

## 2013-12-14 NOTE — Assessment & Plan Note (Signed)
No arrhythmia. We'll continue current medications

## 2013-12-21 ENCOUNTER — Encounter: Payer: Self-pay | Admitting: Cardiovascular Disease

## 2014-02-05 ENCOUNTER — Other Ambulatory Visit: Payer: Self-pay | Admitting: Cardiology

## 2014-03-13 ENCOUNTER — Other Ambulatory Visit (INDEPENDENT_AMBULATORY_CARE_PROVIDER_SITE_OTHER): Payer: BC Managed Care – PPO

## 2014-03-13 DIAGNOSIS — Z79899 Other long term (current) drug therapy: Secondary | ICD-10-CM

## 2014-03-13 DIAGNOSIS — Z125 Encounter for screening for malignant neoplasm of prostate: Secondary | ICD-10-CM

## 2014-03-13 DIAGNOSIS — R5383 Other fatigue: Secondary | ICD-10-CM

## 2014-03-13 DIAGNOSIS — E785 Hyperlipidemia, unspecified: Secondary | ICD-10-CM

## 2014-03-13 DIAGNOSIS — R5381 Other malaise: Secondary | ICD-10-CM

## 2014-03-13 DIAGNOSIS — I10 Essential (primary) hypertension: Secondary | ICD-10-CM

## 2014-03-13 LAB — CBC WITH DIFFERENTIAL/PLATELET
Basophils Absolute: 0 10*3/uL (ref 0.0–0.1)
Basophils Relative: 0.3 % (ref 0.0–3.0)
EOS ABS: 0.1 10*3/uL (ref 0.0–0.7)
Eosinophils Relative: 1.9 % (ref 0.0–5.0)
HCT: 43.7 % (ref 39.0–52.0)
Hemoglobin: 14.7 g/dL (ref 13.0–17.0)
LYMPHS PCT: 27 % (ref 12.0–46.0)
Lymphs Abs: 1.8 10*3/uL (ref 0.7–4.0)
MCHC: 33.7 g/dL (ref 30.0–36.0)
MCV: 87.4 fl (ref 78.0–100.0)
MONO ABS: 0.4 10*3/uL (ref 0.1–1.0)
Monocytes Relative: 6.7 % (ref 3.0–12.0)
NEUTROS PCT: 64.1 % (ref 43.0–77.0)
Neutro Abs: 4.3 10*3/uL (ref 1.4–7.7)
PLATELETS: 234 10*3/uL (ref 150.0–400.0)
RBC: 5 Mil/uL (ref 4.22–5.81)
RDW: 14.1 % (ref 11.5–15.5)
WBC: 6.7 10*3/uL (ref 4.0–10.5)

## 2014-03-13 LAB — BASIC METABOLIC PANEL
BUN: 11 mg/dL (ref 6–23)
CHLORIDE: 105 meq/L (ref 96–112)
CO2: 27 mEq/L (ref 19–32)
Calcium: 8.9 mg/dL (ref 8.4–10.5)
Creatinine, Ser: 0.9 mg/dL (ref 0.4–1.5)
GFR: 91.34 mL/min (ref >60.00)
Glucose, Bld: 83 mg/dL (ref 70–99)
POTASSIUM: 4.5 meq/L (ref 3.5–5.1)
SODIUM: 138 meq/L (ref 135–145)

## 2014-03-13 LAB — HEPATIC FUNCTION PANEL
ALK PHOS: 43 U/L (ref 39–117)
ALT: 25 U/L (ref 0–53)
AST: 16 U/L (ref 0–37)
Albumin: 3.5 g/dL (ref 3.5–5.2)
BILIRUBIN DIRECT: 0.1 mg/dL (ref 0.0–0.3)
BILIRUBIN TOTAL: 0.7 mg/dL (ref 0.2–1.2)
Total Protein: 6.5 g/dL (ref 6.0–8.3)

## 2014-03-13 LAB — LIPID PANEL
CHOL/HDL RATIO: 5
Cholesterol: 137 mg/dL (ref 0–200)
HDL: 25.9 mg/dL — ABNORMAL LOW (ref >39.00)
LDL CALC: 95 mg/dL (ref 0–99)
Triglycerides: 80 mg/dL (ref 0.0–149.0)
VLDL: 16 mg/dL (ref 0.0–40.0)

## 2014-03-13 LAB — PSA: PSA: 0.58 ng/mL (ref 0.10–4.00)

## 2014-03-13 LAB — TSH: TSH: 2.69 u[IU]/mL (ref 0.35–4.50)

## 2014-03-20 ENCOUNTER — Encounter: Payer: Self-pay | Admitting: Family Medicine

## 2014-03-20 ENCOUNTER — Ambulatory Visit (INDEPENDENT_AMBULATORY_CARE_PROVIDER_SITE_OTHER): Payer: BC Managed Care – PPO | Admitting: Family Medicine

## 2014-03-20 VITALS — BP 114/80 | HR 64 | Temp 98.3°F | Ht 72.0 in | Wt 324.5 lb

## 2014-03-20 DIAGNOSIS — Z1211 Encounter for screening for malignant neoplasm of colon: Secondary | ICD-10-CM

## 2014-03-20 DIAGNOSIS — Z0000 Encounter for general adult medical examination without abnormal findings: Secondary | ICD-10-CM

## 2014-03-20 NOTE — Patient Instructions (Signed)
REFERRALS TO SPECIALISTS, SPECIAL TESTS (MRI, CT, ULTRASOUNDS)  GO THE WAITING ROOM AND TELL CHECK IN YOU NEED HELP WITH A REFERRAL. Either MARION or LINDA will help you set it up.  If it is between 1-2 PM they may be at lunch.  After 5 PM, they will likely be at home.  They will call you, so please make sure the office has your correct phone number.  Referrals sometimes can be done same day if urgent, but others can take 2 or 3 days to get an appointment. Starting in 2015, many of the new Medicare insurance plans and Affordable Care Act (Obamacare) Health plans offered take much longer for referrals. They have added additional paperwork and steps.  MRI's and CT's can take up to a week for the test. (Emergencies like strokes take precedence. I will tell you if you have an emergency.)   If your referral is to an in-network [facility] office, their office may contact you directly prior to our office reaching you.  -- Examples: [facility], [facility], [facility], [facility], [facility], and many more.  Specialist appointment times vary a great deal, mostly on the specialist's schedule and if they have openings. -- Our office tries to get you in as fast as possible. -- Some specialists have very long wait times. (Example. Dermatology. Usually months) -- If you have a true emergency like new cancer, we work to get you in ASAP.   

## 2014-03-20 NOTE — Progress Notes (Signed)
[place] [address] Phone: [phone] Fax: [phone]  Patient ID: Brandon [name] MRN: [id number], DOB: 09/10/1966, 48 y.o. Date of Encounter: 03/20/2014  Primary Physician:  Owens Loffler, MD   Chief Complaint: Annual Exam   Subjective:   History of Present Illness:  Brandon [name] is a 48 y.o. pleasant patient who presents with the following:  Preventative Health Maintenance Visit:  Neck bothering him.  ? Home traction unit - ask Dr. Nelva Bush  Skin lesions. Overall doing well  Health Maintenance Summary Reviewed and updated, unless pt declines services.  Tobacco History Reviewed. Alcohol: No concerns, no excessive use Exercise Habits: rare activity, rec at least 30 mins 5 times a week STD concerns: no risk or activity to increase risk Drug Use: None Encouraged self-testicular check  Health Maintenance  Topic Date Due  . Colonoscopy  01/07/2014  . Influenza Vaccine  06/03/2014  . Tetanus/tdap  12/04/2020    Immunization History  Administered Date(s) Administered  . Influenza Whole 11/15/2009, 07/14/2011  . Influenza, Seasonal, Injecte, Preservative Fre 11/08/2012  . Influenza,inj,Quad PF,36+ Mos 08/24/2013  . Td 12/04/2010    Patient Active Problem List   Diagnosis Date Noted  . Cervical disc disorder with radiculopathy of cervical region 10/05/2013  . Carpal tunnel syndrome 10/05/2013  . Essential hypertension 06/16/2013  . 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 Basal cell carcinoma 06/25/2011  . Erectile dysfunction 06/09/2011  . Morbid obesity with BMI of 40.0-44.9, adult 06/07/2009  . DEPRESSION 06/07/2009  . ATRIAL FIBRILLATION, PAROXYSMAL 06/07/2009  . DIVERTICULITIS, COLON 06/07/2009  . ARTHRITIS 06/07/2009   Past Medical History  Diagnosis Date  . DIVERTICULITIS, COLON 06/07/2009  . DEPRESSION 06/07/2009  . CHICKENPOX, HX OF 06/07/2009  . ARTHRITIS 06/07/2009  . ATRIAL FIBRILLATION, PAROXYSMAL 06/07/2009  . Hematuria   . Classical  migraine 03/03/2012  . Cervical disc disorder with radiculopathy of cervical region 10/05/2013  . Atrial fibrillation    Past Surgical History  Procedure Laterality Date  . Cardiac catheterization    . Tonsillectomy    . Foot surgery      left foot   History   Social History  . Marital Status: Single    Spouse Name: N/A    Number of Children: N/A  . Years of Education: N/A   Occupational History  . self employed    Social History Main Topics  . Smoking status: Never Smoker   . Smokeless tobacco: Former Systems developer    Quit date: 06/17/2011     Comment: occasionally  . Alcohol Use: Yes     Comment: occ  . Drug Use: No  . Sexual Activity: Not on file   Other Topics Concern  . Not on file   Social History Narrative   Does not do regular exercise. Former dip - quit about 1 year ago.    Family History  Problem Relation Age of Onset  . Arthritis    . Diabetes      1st degree relative  . Stroke      F 1st degree relative <60; M 1st degree relative <50  . Stroke Father    Allergies  Allergen Reactions  . Biaxin [Clarithromycin]     Pt has history of arrhythmias  . Penicillins     Medication list has been reviewed and updated.  Review of Systems:  General: Denies fever, chills, sweats. No significant weight loss. Eyes: Denies blurring,significant itching ENT: Denies earache, sore throat, and hoarseness. Cardiovascular: Denies chest pains, palpitations, dyspnea  on exertion Respiratory: Denies cough, dyspnea at rest,wheeezing Breast: no concerns about lumps GI: Denies nausea, vomiting, diarrhea, constipation, change in bowel habits, abdominal pain, melena, hematochezia GU: Denies penile discharge, ED, urinary flow / outflow problems. No STD concerns. Musculoskeletal: Denies back pain, joint pain, + neck pain Derm: Denies rash, itching Neuro: Denies  paresthesias, frequent falls, frequent headaches Psych: Denies depression, anxiety Endocrine: Denies cold intolerance,  heat intolerance, polydipsia Heme: Denies enlarged lymph nodes Allergy: No hayfever  Objective:   Physical Examination: BP 114/80  Pulse 64  Temp(Src) 98.3 F (36.8 C) (Oral)  Ht 6' (1.829 m)  Wt 324 lb 8 oz (147.192 kg)  BMI 44.00 kg/m2 Ideal Body Weight: Weight in (lb) to have BMI = 25: 183.9  GEN: well developed, well nourished, no acute distress Eyes: conjunctiva and lids normal, PERRLA, EOMI ENT: TM clear, nares clear, oral exam WNL Neck: supple, no lymphadenopathy, no thyromegaly, no JVD Pulm: clear to auscultation and percussion, respiratory effort normal CV: regular rate and rhythm, S1-S2, no murmur, rub or gallop, no bruits, peripheral pulses normal and symmetric, no cyanosis, clubbing, edema or varicosities GI: soft, non-tender; no hepatosplenomegaly, masses; active bowel sounds all quadrants GU: no hernia, testicular mass, penile discharge Lymph: no cervical, axillary or inguinal adenopathy MSK: gait normal, muscle tone and strength WNL, no joint swelling, effusions, discoloration, crepitus  SKIN: clear, good turgor, color WNL, no rashes, lesions, or ulcerations Neuro: normal mental status, normal strength, sensation, and motion Psych: alert; oriented to person, place and time, normally interactive and not anxious or depressed in appearance.  All labs reviewed with patient.  Lipids:    Component Value Date/Time   CHOL 137 03/13/2014 0835   TRIG 80.0 03/13/2014 0835   HDL 25.90* 03/13/2014 0835   VLDL 16.0 03/13/2014 0835   CHOLHDL 5 03/13/2014 0835   CBC:    Component Value Date/Time   WBC 6.7 03/13/2014 0835   HGB 14.7 03/13/2014 0835   HCT 43.7 03/13/2014 0835   PLT 234.0 03/13/2014 0835   MCV 87.4 03/13/2014 0835   NEUTROABS 4.3 03/13/2014 0835   LYMPHSABS 1.8 03/13/2014 0835   MONOABS 0.4 03/13/2014 0835   EOSABS 0.1 03/13/2014 0835   BASOSABS 0.0 03/13/2014 1497   Basic Metabolic Panel:    Component Value Date/Time   NA 138 03/13/2014 0835   K 4.5 03/13/2014  0835   CL 105 03/13/2014 0835   CO2 27 03/13/2014 0835   BUN 11 03/13/2014 0835   CREATININE 0.9 03/13/2014 0835   GLUCOSE 83 03/13/2014 0835   CALCIUM 8.9 03/13/2014 0835   Lab Results  Component Value Date   ALT 25 03/13/2014   AST 16 03/13/2014   ALKPHOS 43 03/13/2014   BILITOT 0.7 03/13/2014   Lab Results  Component Value Date   TSH 2.69 03/13/2014   Lab Results  Component Value Date   PSA 0.58 03/13/2014   PSA 0.61 11/08/2012   PSA 0.64 01/05/2012    Assessment & Plan:   Routine general medical examination at a health care facility  Special screening for malignant neoplasms, colon - Plan: Ambulatory referral to Gastroenterology  Health Maintenance Exam: The patient's preventative maintenance and recommended screening tests for an annual wellness exam were reviewed in full today. Brought up to date unless services declined.  Counselled on the importance of diet, exercise, and its role in overall health and mortality. The patient's FH and SH was reviewed, including their home life, tobacco status, and drug and alcohol status.  Follow-up: No Follow-up on file. Or follow-up in 1 year for complete physical examination  New Prescriptions   No medications on file   Orders Placed This Encounter  Procedures  . Ambulatory referral to Gastroenterology    Signed,  Frederico Hamman T. Anastasiya Gowin, MD, [place]   Patient's Medications  New Prescriptions   No medications on file  Previous Medications   ASPIRIN 325 MG TABLET    Take 325 mg by mouth daily.     FLECAINIDE (TAMBOCOR) 100 MG TABLET    Take 1 tablet (100 mg total) by mouth 2 (two) times daily.   OMEPRAZOLE (PRILOSEC) 20 MG CAPSULE    Take 20 mg by mouth daily.    SUMATRIPTAN (IMITREX) 50 MG TABLET    Take 1 tablet (50 mg total) by mouth every 2 (two) hours as needed for migraine.   TAZTIA XT 180 MG 24 HR CAPSULE    TAKE ONE CAPSULE BY MOUTH ONCE DAILY  Modified Medications   No medications on file  Discontinued  Medications   No medications on file

## 2014-03-20 NOTE — Progress Notes (Signed)
Pre visit review using our clinic review tool, if applicable. No additional management support is needed unless otherwise documented below in the visit note. 

## 2014-04-19 ENCOUNTER — Telehealth: Payer: Self-pay

## 2014-04-19 NOTE — Telephone Encounter (Signed)
Patient needs a cardiac clearance for a colonoscopy with Dr. Gustavo Lah as well as hold the aspirin 325 mg for 5 days prior. Please advise.

## 2014-04-20 NOTE — Telephone Encounter (Signed)
Okay to hold all blood thinners prior to the procedure, Restart after procedures complete Acceptable risk with no further testing needed

## 2014-04-20 NOTE — Telephone Encounter (Signed)
Clearance letter sent to Dr. Marton Redwood office w/ Dr. Rockey Situ recommendation.

## 2014-05-03 HISTORY — PX: COLONOSCOPY: SHX5424

## 2014-05-15 ENCOUNTER — Ambulatory Visit: Payer: Self-pay | Admitting: Gastroenterology

## 2014-05-15 LAB — HM COLONOSCOPY: HM Colonoscopy: 2

## 2014-05-17 LAB — PATHOLOGY REPORT

## 2014-05-18 NOTE — Telephone Encounter (Signed)
Close encounter 

## 2014-05-22 ENCOUNTER — Encounter: Payer: Self-pay | Admitting: Family Medicine

## 2014-06-21 ENCOUNTER — Other Ambulatory Visit: Payer: Self-pay | Admitting: Cardiovascular Disease

## 2014-07-03 ENCOUNTER — Ambulatory Visit (INDEPENDENT_AMBULATORY_CARE_PROVIDER_SITE_OTHER): Payer: BC Managed Care – PPO | Admitting: Family Medicine

## 2014-07-03 ENCOUNTER — Encounter: Payer: Self-pay | Admitting: Family Medicine

## 2014-07-03 VITALS — BP 116/80 | HR 55 | Temp 98.1°F | Ht 72.0 in | Wt 327.5 lb

## 2014-07-03 DIAGNOSIS — R0989 Other specified symptoms and signs involving the circulatory and respiratory systems: Secondary | ICD-10-CM

## 2014-07-03 DIAGNOSIS — R0609 Other forms of dyspnea: Secondary | ICD-10-CM

## 2014-07-03 DIAGNOSIS — R609 Edema, unspecified: Secondary | ICD-10-CM

## 2014-07-03 DIAGNOSIS — R0600 Dyspnea, unspecified: Secondary | ICD-10-CM

## 2014-07-03 LAB — BRAIN NATRIURETIC PEPTIDE: Pro B Natriuretic peptide (BNP): 12 pg/mL (ref 0.0–100.0)

## 2014-07-03 LAB — BASIC METABOLIC PANEL
BUN: 11 mg/dL (ref 6–23)
CALCIUM: 9.2 mg/dL (ref 8.4–10.5)
CO2: 28 mEq/L (ref 19–32)
CREATININE: 1.1 mg/dL (ref 0.4–1.5)
Chloride: 102 mEq/L (ref 96–112)
GFR: 75.16 mL/min (ref >60.00)
GLUCOSE: 90 mg/dL (ref 70–99)
POTASSIUM: 4.2 meq/L (ref 3.5–5.1)
Sodium: 138 mEq/L (ref 135–145)

## 2014-07-03 LAB — HEPATIC FUNCTION PANEL
ALBUMIN: 3.9 g/dL (ref 3.5–5.2)
ALT: 34 U/L (ref 0–53)
AST: 27 U/L (ref 0–37)
Alkaline Phosphatase: 42 U/L (ref 39–117)
Bilirubin, Direct: 0.1 mg/dL (ref 0.0–0.3)
Total Bilirubin: 0.9 mg/dL (ref 0.2–1.2)
Total Protein: 7.8 g/dL (ref 6.0–8.3)

## 2014-07-03 NOTE — Progress Notes (Signed)
Pre visit review using our clinic review tool, if applicable. No additional management support is needed unless otherwise documented below in the visit note. 

## 2014-07-03 NOTE — Progress Notes (Signed)
Dr. Frederico Hamman T. Mele Sylvester, MD, [facility] [place], [address] Phone: [phone] Fax: [phone]  07/03/2014  Patient: Brandon [name], MRN: [id number], DOB: 1966-02-11, 48 y.o.  Primary Physician:  Owens Loffler, MD  Chief Complaint: Edema  Subjective:   Brandon [name] is a 48 y.o. very pleasant male patient who presents with the following:  Pleasant morbidly obese gentleman who has a history of atrial fibrillation and he is well controlled on flecainide, aspirin, and a calcium channel blocker. He also today presents with complaints of some diffuse edema in his extremities and hands and feet as well as some in the face he thought was appreciable. The only new exposure he could potentially have was when he changed soaps about a week ago. Otherwise no new detergents. None in internal ingestions. He is not particularly short of breath, but he does get short of breath with exertion. Now does seem to be quite a bit better.  Swelling, face, hands, and feet. Almost all over with some swelling. Started some itching. Changed soaps about a week ago.  Taking heart meds.   Past Medical History, Surgical History, Social History, Family History, Problem List, Medications, and Allergies have been reviewed and updated if relevant.   GEN: No acute illnesses, no fevers, chills. GI: No n/v/d, eating normally Pulm: No SOB Interactive and getting along well at home.  Otherwise, ROS is as per the HPI.  Objective:   BP 116/80  Pulse 55  Temp(Src) 98.1 F (36.7 C) (Oral)  Ht 6' (1.829 m)  Wt 327 lb 8 oz (148.553 kg)  BMI 44.41 kg/m2  SpO2 97%  GEN: WDWN, NAD, Non-toxic, A & O x 3 HEENT: Atraumatic, Normocephalic. Neck supple. No masses, No LAD. Ears and Nose: No external deformity. CV: RRR, No M/G/R. No JVD. No thrill. No extra heart sounds. PULM: CTA B, no wheezes, crackles, rhonchi. No retractions. No resp. distress. No  accessory muscle use. EXTR: No c/c/e NEURO Normal gait.  PSYCH: Normally interactive. Conversant. Not depressed or anxious appearing.  Calm demeanor.   Laboratory and Imaging Data:  Lipids:    Component Value Date/Time   CHOL 137 03/13/2014 0835   TRIG 80.0 03/13/2014 0835   HDL 25.90* 03/13/2014 0835   VLDL 16.0 03/13/2014 0835   CHOLHDL 5 03/13/2014 0835   CBC: CBC Latest Ref Rng 03/13/2014 11/08/2012 01/05/2012  WBC 4.0 - 10.5 K/uL 6.7 7.2 10.6(H)  Hemoglobin 13.0 - 17.0 g/dL 14.7 15.4 15.6  Hematocrit 39.0 - 52.0 % 43.7 45.5 46.4  Platelets 150.0 - 400.0 K/uL 234.0 241.0 295.2    Basic Metabolic Panel:    Component Value Date/Time   NA 138 03/13/2014 0835   K 4.5 03/13/2014 0835   CL 105 03/13/2014 0835   CO2 27 03/13/2014 0835   BUN 11 03/13/2014 0835   CREATININE 0.9 03/13/2014 0835   GLUCOSE 83 03/13/2014 0835   CALCIUM 8.9 03/13/2014 0835   Hepatic Function Latest Ref Rng 03/13/2014 11/08/2012 01/05/2012  Total Protein 6.0 - 8.3 g/dL 6.5 7.2 6.4  Albumin 3.5 - 5.2 g/dL 3.5 3.7 3.5  AST 0 - 37 U/L '16 23 15  ' ALT 0 - 53 U/L '25 30 22  ' Alk Phosphatase 39 - 117 U/L 43 45 55  Total Bilirubin 0.2 - 1.2 mg/dL 0.7 0.9 0.4  Bilirubin, Direct 0.0 - 0.3 mg/dL 0.1 0.1 0.1    Lab Results  Component Value Date  TSH 2.69 03/13/2014   Lab Results  Component Value Date   PSA 0.58 03/13/2014   PSA 0.61 11/08/2012   PSA 0.64 01/05/2012    Assessment and Plan:   Edema - Plan: Brain natriuretic peptide, Basic metabolic panel, Hepatic function panel  Dyspnea - Plan: Brain natriuretic peptide  Unclear etiology. Check BMP for potential heart failure, liver and hepatic function.  This may all be secondary to exposure to his new soap, but unclear.   Follow-up: No Follow-up on file.  New Prescriptions   No medications on file   Orders Placed This Encounter  Procedures  . Brain natriuretic peptide  . Basic metabolic panel  . Hepatic function panel    Signed,  Frederico Hamman T. Amandeep Nesmith,  MD   Patient's Medications  New Prescriptions   No medications on file  Previous Medications   ASPIRIN 325 MG TABLET    Take 325 mg by mouth daily.     FLECAINIDE (TAMBOCOR) 100 MG TABLET    TAKE ONE TABLET BY MOUTH TWICE DAILY   OMEPRAZOLE (PRILOSEC) 20 MG CAPSULE    Take 20 mg by mouth daily.    SUMATRIPTAN (IMITREX) 50 MG TABLET    Take 1 tablet (50 mg total) by mouth every 2 (two) hours as needed for migraine.   TAZTIA XT 180 MG 24 HR CAPSULE    TAKE ONE CAPSULE BY MOUTH ONCE DAILY  Modified Medications   No medications on file  Discontinued Medications   No medications on file

## 2014-07-27 ENCOUNTER — Ambulatory Visit (INDEPENDENT_AMBULATORY_CARE_PROVIDER_SITE_OTHER): Payer: BC Managed Care – PPO | Admitting: Family Medicine

## 2014-07-27 ENCOUNTER — Encounter: Payer: Self-pay | Admitting: Family Medicine

## 2014-07-27 VITALS — BP 104/70 | HR 54 | Temp 97.8°F | Ht 72.0 in | Wt 332.0 lb

## 2014-07-27 DIAGNOSIS — J3489 Other specified disorders of nose and nasal sinuses: Secondary | ICD-10-CM

## 2014-07-27 DIAGNOSIS — J069 Acute upper respiratory infection, unspecified: Secondary | ICD-10-CM

## 2014-07-27 DIAGNOSIS — R0981 Nasal congestion: Secondary | ICD-10-CM

## 2014-07-27 DIAGNOSIS — B9789 Other viral agents as the cause of diseases classified elsewhere: Principal | ICD-10-CM

## 2014-07-27 DIAGNOSIS — T485X5A Adverse effect of other anti-common-cold drugs, initial encounter: Secondary | ICD-10-CM | POA: Insufficient documentation

## 2014-07-27 DIAGNOSIS — J310 Chronic rhinitis: Secondary | ICD-10-CM | POA: Insufficient documentation

## 2014-07-27 NOTE — Progress Notes (Signed)
   Subjective:    Patient ID: Brandon [name], male    DOB: 1966/08/07, 48 y.o.   MRN: [id number]  Cough This is a new problem. The current episode started in the past 7 days (2 day). The problem has been gradually worsening. The cough is non-productive. Associated symptoms include headaches, nasal congestion, postnasal drip and rhinorrhea. Pertinent negatives include no chest pain, chills, ear pain, fever, myalgias, rash, sore throat, shortness of breath or wheezing. Associated symptoms comments: sinus pressure, PND  fatigue. The symptoms are aggravated by lying down (Not resting well at night due to nasal congestion). Treatments tried: using afrin x intermitant for a few weeks.  Headache  Associated symptoms include coughing and rhinorrhea. Pertinent negatives include no ear pain, fever or sore throat.      Review of Systems  Constitutional: Negative for fever and chills.  HENT: Positive for postnasal drip and rhinorrhea. Negative for ear pain and sore throat.   Respiratory: Positive for cough. Negative for shortness of breath and wheezing.   Cardiovascular: Negative for chest pain.  Musculoskeletal: Negative for myalgias.  Skin: Negative for rash.  Neurological: Positive for headaches.       Objective:   Physical Exam  Constitutional: Vital signs are normal. He appears well-developed and well-nourished.  Non-toxic appearance. He does not appear ill. No distress.  HENT:  Head: Normocephalic and atraumatic.  Right Ear: Hearing, tympanic membrane, external ear and ear canal normal. No tenderness. No foreign bodies. Tympanic membrane is not retracted and not bulging.  Left Ear: Hearing, tympanic membrane, external ear and ear canal normal. No tenderness. No foreign bodies. Tympanic membrane is not retracted and not bulging.  Nose: Nose normal. No mucosal edema or rhinorrhea. Right sinus exhibits no maxillary sinus tenderness and no frontal sinus tenderness. Left sinus exhibits no  maxillary sinus tenderness and no frontal sinus tenderness.  Mouth/Throat: Uvula is midline, oropharynx is clear and moist and mucous membranes are normal. Normal dentition. No dental caries. No oropharyngeal exudate or tonsillar abscesses.  Eyes: Conjunctivae, EOM and lids are normal. Pupils are equal, round, and reactive to light. Lids are everted and swept, no foreign bodies found.  Neck: Trachea normal, normal range of motion and phonation normal. Neck supple. Carotid bruit is not present. No mass and no thyromegaly present.  Cardiovascular: Normal rate, regular rhythm, S1 normal, S2 normal, normal heart sounds, intact distal pulses and normal pulses.  Exam reveals no gallop.   No murmur heard. Pulmonary/Chest: Effort normal and breath sounds normal. No respiratory distress. He has no wheezes. He has no rhonchi. He has no rales.  Abdominal: Soft. Normal appearance and bowel sounds are normal. There is no hepatosplenomegaly. There is no tenderness. There is no rebound, no guarding and no CVA tenderness. No hernia.  Neurological: He is alert. He has normal reflexes.  Skin: Skin is warm, dry and intact. No rash noted.  Psychiatric: He has a normal mood and affect. His speech is normal and behavior is normal. Judgment normal.          Assessment & Plan:

## 2014-07-27 NOTE — Patient Instructions (Signed)
Rest, fluids. Start mucinex DM twice daily, Nasal saline spray 2-3 times daily.  Start OTC flonase 2 sprays per nostril daily.  Stop afrin. Expect viral infection to continue 5-7 more days. Call if shortness of breath, new fever or severe sinus pain. Go to ER if severe shortness of breath.

## 2014-07-27 NOTE — Assessment & Plan Note (Signed)
Overuse of afrin likely contributing to nasal symptoms.  Stop afrin. Start nasal steroid spray.

## 2014-07-27 NOTE — Assessment & Plan Note (Signed)
Rest, fluids. Start mucinex DM twice daily, Nasal saline spray 2-3 times daily.  Start OTC flonase 2 sprays per nostril daily.  Stop afrin.

## 2014-07-27 NOTE — Progress Notes (Signed)
Pre visit review using our clinic review tool, if applicable. No additional management support is needed unless otherwise documented below in the visit note. 

## 2014-08-16 ENCOUNTER — Ambulatory Visit (INDEPENDENT_AMBULATORY_CARE_PROVIDER_SITE_OTHER): Payer: BC Managed Care – PPO | Admitting: Family Medicine

## 2014-08-16 ENCOUNTER — Encounter: Payer: Self-pay | Admitting: Family Medicine

## 2014-08-16 VITALS — BP 118/76 | HR 66 | Temp 98.2°F | Ht 72.0 in | Wt 332.5 lb

## 2014-08-16 DIAGNOSIS — Z23 Encounter for immunization: Secondary | ICD-10-CM

## 2014-08-16 DIAGNOSIS — M7021 Olecranon bursitis, right elbow: Secondary | ICD-10-CM

## 2014-08-16 NOTE — Progress Notes (Signed)
   Dr. Frederico Hamman T. Wenceslao Loper, MD, [facility] and Sports Medicine [place], [address] Phone: [phone] Fax: [phone]  08/16/2014  Patient: Brandon [name], MRN: [id number], DOB: [birth year], 48 y.o.  Primary Physician:  Owens Loffler, MD  Chief Complaint: Joint Swelling  Subjective:   Brandon [name] is a 48 y.o. very pleasant male patient who presents with the following:  R olecranon bursitis. Started < 1 week. No trauma, injury, redness, or warmth. It was swollen quit a bit, but it has decreased in size in the last 2 days  Past Medical History, Surgical History, Social History, Family History, Problem List, Medications, and Allergies have been reviewed and updated if relevant.  GEN: No fevers, chills. Nontoxic. Primarily MSK c/o today. MSK: Detailed in the HPI GI: tolerating PO intake without difficulty Neuro: No numbness, parasthesias, or tingling associated. Otherwise the pertinent positives of the ROS are noted above.   Objective:   BP 118/76  Pulse 66  Temp(Src) 98.2 F (36.8 C) (Oral)  Ht 6' (1.829 m)  Wt 332 lb 8 oz (150.821 kg)  BMI 45.09 kg/m2  SpO2 96%   GEN: WDWN, NAD, Non-toxic, Alert & Oriented x 3 HEENT: Atraumatic, Normocephalic.  Ears and Nose: No external deformity. EXTR: No clubbing/cyanosis/edema NEURO: Normal gait.  PSYCH: Normally interactive. Conversant. Not depressed or anxious appearing.  Calm demeanor.   R elbow Ecchymosis or edema: neg ROM: full flexion, extension, pronation, supination Mildly boggy olecranon bursa Shoulder ROM: Full Flexion: 5/5 Extension: 5/5 Supination: 5/5  Pronation: 5/5 Wrist ext: 5/5 Wrist flexion: 5/5 No gross bony abnormality Varus and Valgus stress: stable ECRB tenderness: neg Medial epicondyle: NT Lateral epicondyle, resisted wrist extension from wrist full pronation and flexion: NT grip: 5/5  sensation intact Tinel's, Elbow: negative    Radiology: No results found.  Assessment and Plan:   Olecranon bursitis, right  Need for prophylactic vaccination and inoculation against influenza - Plan: Flu Vaccine QUAD 36+ mos PF IM (Fluarix Quad PF)  Looks very good and decompressed. Compression only for the next 4-5 days  Follow-up: No Follow-up on file.  New Prescriptions   No medications on file   Orders Placed This Encounter  Procedures  . Flu Vaccine QUAD 36+ mos PF IM (Fluarix Quad PF)    Signed,  Elaiza Shoberg T. Deauna Yaw, MD   Patient's Medications  New Prescriptions   No medications on file  Previous Medications   ASPIRIN 325 MG TABLET    Take 325 mg by mouth daily.     FLECAINIDE (TAMBOCOR) 100 MG TABLET    TAKE ONE TABLET BY MOUTH TWICE DAILY   OMEPRAZOLE (PRILOSEC) 20 MG CAPSULE    Take 20 mg by mouth daily.    SUMATRIPTAN (IMITREX) 50 MG TABLET    Take 1 tablet (50 mg total) by mouth every 2 (two) hours as needed for migraine.   TAZTIA XT 180 MG 24 HR CAPSULE    TAKE ONE CAPSULE BY MOUTH ONCE DAILY  Modified Medications   No medications on file  Discontinued Medications   No medications on file

## 2014-08-16 NOTE — Progress Notes (Signed)
Pre visit review using our clinic review tool, if applicable. No additional management support is needed unless otherwise documented below in the visit note. 

## 2014-10-12 ENCOUNTER — Ambulatory Visit (INDEPENDENT_AMBULATORY_CARE_PROVIDER_SITE_OTHER): Payer: BC Managed Care – PPO | Admitting: Family Medicine

## 2014-10-12 ENCOUNTER — Encounter: Payer: Self-pay | Admitting: Family Medicine

## 2014-10-12 VITALS — BP 114/80 | HR 64 | Temp 98.6°F | Ht 72.0 in | Wt 334.5 lb

## 2014-10-12 DIAGNOSIS — H6981 Other specified disorders of Eustachian tube, right ear: Secondary | ICD-10-CM

## 2014-10-12 MED ORDER — FLUTICASONE PROPIONATE 50 MCG/ACT NA SUSP: 2.0000 | Freq: Every day | NASAL | Status: DC

## 2014-10-12 NOTE — Progress Notes (Signed)
Dr. Frederico Hamman T. Gonzalo Waymire, MD, [facility] and [facility] Medicine [place], [address] Phone: [phone] Fax: [phone]  10/12/2014  Patient: Brandon [name], MRN: [id number], DOB: 07-24-1966, 48 y.o.  Primary Physician:  Owens Loffler, MD  Chief Complaint: Ear Issues and Tinnitus  Subjective:   Brandon [name] is a 48 y.o. very pleasant male patient who presents with the following:  Right ear, was acting up and a couple of weeks ago, where he will hear some constant air or water. No allergies. Not sick recently.  Past Medical History, Surgical History, Social History, Family History, Problem List, Medications, and Allergies have been reviewed and updated if relevant.   GEN: No acute illnesses, no fevers, chills. GI: No n/v/d, eating normally Pulm: No SOB Interactive and getting along well at home.  Otherwise, ROS is as per the HPI.  Objective:   BP 114/80 mmHg  Pulse 64  Temp(Src) 98.6 F (37 C) (Oral)  Ht 6' (1.829 m)  Wt 334 lb 8 oz (151.728 kg)  BMI 45.36 kg/m2  GEN: WDWN, NAD, Non-toxic, A & O x 3 HEENT: Atraumatic, Normocephalic. Neck supple. No masses, No LAD. Ears and Nose: No external deformity. TM clear, fluid B CV: RRR, No M/G/R. No JVD. No thrill. No extra heart sounds. PULM: CTA B, no wheezes, crackles, rhonchi. No retractions. No resp. distress. No accessory muscle use. EXTR: No c/c/e NEURO Normal gait.  PSYCH: Normally interactive. Conversant. Not depressed or anxious appearing.  Calm demeanor.   Laboratory and Imaging Data:  Assessment and Plan:   Eustachian tube dysfunction, right  Refer to the patient instructions sections for details of plan shared with patient.   Patient Instructions  Eustachian Tube Dysfunction: There is a tube that connects between the sinuses and behind the ear called the "eustachian tube." Sometimes when you have allergies, a cold, or nasal congestion for any reason this  tube can get blocked and pressure cannot equalize in your ears. (Like if you swim in deep water) This can also trap fluid behind the ear and give you a full, pressure-like sensation that is uncomfortable, but it is not an ear infection.  Recommendations: Afrin Nasal Spray: 2 sprays twice a day for a maximum of 3-4 days (longer than this and your nose gets addicted, and you have rebound swelling that makes it worse.) Anti-Histamine: Allegra, Zyrtec, or Claritin. All over the counter now and once a day.  Nasal steroid. Nasacort is over the counter now. About 10 prescription ones exist.  If you develop fever > 100.4, then things can change fluid behind the ear does increase your risk of developing an ear infection.      Follow-up: No Follow-up on file.  New Prescriptions   FLUTICASONE (FLONASE) 50 MCG/ACT NASAL SPRAY    Place 2 sprays into both nostrils daily.   No orders of the defined types were placed in this encounter.    Signed,  Maud Deed. Ellyce Lafevers, MD   Patient's Medications  New Prescriptions   FLUTICASONE (FLONASE) 50 MCG/ACT NASAL SPRAY    Place 2 sprays into both nostrils daily.  Previous Medications   ASPIRIN 325 MG TABLET    Take 325 mg by mouth daily.     FLECAINIDE (TAMBOCOR) 100 MG TABLET    TAKE ONE TABLET BY MOUTH TWICE DAILY   OMEPRAZOLE (PRILOSEC) 20 MG CAPSULE    Take 20 mg by mouth daily.    SUMATRIPTAN (IMITREX) 50 MG TABLET  Take 1 tablet (50 mg total) by mouth every 2 (two) hours as needed for migraine.   TAZTIA XT 180 MG 24 HR CAPSULE    TAKE ONE CAPSULE BY MOUTH ONCE DAILY  Modified Medications   No medications on file  Discontinued Medications   No medications on file

## 2014-10-12 NOTE — Progress Notes (Signed)
Pre visit review using our clinic review tool, if applicable. No additional management support is needed unless otherwise documented below in the visit note. 

## 2014-10-12 NOTE — Patient Instructions (Signed)
Eustachian Tube Dysfunction: There is a tube that connects between the sinuses and behind the ear called the "eustachian tube." Sometimes when you have allergies, a cold, or nasal congestion for any reason this tube can get blocked and pressure cannot equalize in your ears. (Like if you swim in deep water) This can also trap fluid behind the ear and give you a full, pressure-like sensation that is uncomfortable, but it is not an ear infection.  Recommendations: Afrin Nasal Spray: 2 sprays twice a day for a maximum of 3-4 days (longer than this and your nose gets addicted, and you have rebound swelling that makes it worse.) Anti-Histamine: Allegra, Zyrtec, or Claritin. All over the counter now and once a day.  Nasal steroid. Nasacort is over the counter now. About 10 prescription ones exist.  If you develop fever > 100.4, then things can change fluid behind the ear does increase your risk of developing an ear infection. 

## 2014-10-25 ENCOUNTER — Encounter: Payer: Self-pay | Admitting: Family Medicine

## 2014-10-25 ENCOUNTER — Ambulatory Visit (INDEPENDENT_AMBULATORY_CARE_PROVIDER_SITE_OTHER): Payer: BC Managed Care – PPO | Admitting: Family Medicine

## 2014-10-25 VITALS — BP 120/80 | HR 70 | Temp 99.0°F | Ht 72.0 in | Wt 337.5 lb

## 2014-10-25 DIAGNOSIS — M7022 Olecranon bursitis, left elbow: Secondary | ICD-10-CM

## 2014-10-25 NOTE — Progress Notes (Signed)
   Dr. Frederico Hamman T. Katheline Brendlinger, MD, [facility] Medicine [place], [address] Phone: [phone] Fax: [phone]  10/25/2014  Patient: Brandon [name], MRN: [id number], DOB: 12-15-1965, 48 y.o.  Primary Physician:  Owens Loffler, MD  Chief Complaint: Bursitis  Subjective:   Brandon [name] is a 48 y.o. very pleasant male patient who presents with the following:  Left olecranon bursitis, last week, has shrunk some. Already deflated. He does not hurt at all. He denied any kind,. He was using a saw some sort last week. He was using some machinery. R olecranon bursitis 08/2014.  Past Medical History, Surgical History, Social History, Family History, Problem List, Medications, and Allergies have been reviewed and updated if relevant.  GEN: No fevers, chills. Nontoxic. Primarily MSK c/o today. MSK: Detailed in the HPI GI: tolerating PO intake without difficulty Neuro: No numbness, parasthesias, or tingling associated. Otherwise the pertinent positives of the ROS are noted above.   Objective:   BP 120/80 mmHg  Pulse 70  Temp(Src) 99 F (37.2 C) (Oral)  Ht 6' (1.829 m)  Wt 337 lb 8 oz (153.089 kg)  BMI 45.76 kg/m2   GEN: WDWN, NAD, Non-toxic, Alert & Oriented x 3 HEENT: Atraumatic, Normocephalic.  Ears and Nose: No external deformity. EXTR: No clubbing/cyanosis/edema NEURO: Normal gait.  PSYCH: Normally interactive. Conversant. Not depressed or anxious appearing.  Calm demeanor.   L elbow Ecchymosis or edema: neg The olecranon bursa is mildly boggy ROM: full flexion, extension, pronation, supination Shoulder ROM: Full Flexion: 5/5 Extension: 5/5 Supination: 5/5  Pronation: 5/5 Wrist ext: 5/5 Wrist flexion: 5/5 No gross bony abnormality Varus and Valgus stress: stable ECRB tenderness: neg Medial epicondyle: NT Lateral epicondyle, resisted wrist extension from wrist full pronation and flexion: NT grip:  5/5  sensation intact Tinel's, Elbow: negative   Radiology: No results found.  Assessment and Plan:   Olecranon bursitis, left  The olecranon bursa is mildly boggy. Her recommended that he put on a compression sleeve that regard he has. At this point, do not think that it is drainable  Follow-up: No Follow-up on file.  New Prescriptions   No medications on file   No orders of the defined types were placed in this encounter.    Signed,  Maud Deed. Ahmad Vanwey, MD   Patient's Medications  New Prescriptions   No medications on file  Previous Medications   ASPIRIN 325 MG TABLET    Take 325 mg by mouth daily.     FLECAINIDE (TAMBOCOR) 100 MG TABLET    TAKE ONE TABLET BY MOUTH TWICE DAILY   FLUTICASONE (FLONASE) 50 MCG/ACT NASAL SPRAY    Place 2 sprays into both nostrils daily.   OMEPRAZOLE (PRILOSEC) 20 MG CAPSULE    Take 20 mg by mouth daily.    SUMATRIPTAN (IMITREX) 50 MG TABLET    Take 1 tablet (50 mg total) by mouth every 2 (two) hours as needed for migraine.   TAZTIA XT 180 MG 24 HR CAPSULE    TAKE ONE CAPSULE BY MOUTH ONCE DAILY  Modified Medications   No medications on file  Discontinued Medications   No medications on file

## 2014-10-25 NOTE — Progress Notes (Signed)
Pre visit review using our clinic review tool, if applicable. No additional management support is needed unless otherwise documented below in the visit note. 

## 2014-12-13 ENCOUNTER — Other Ambulatory Visit: Payer: Self-pay | Admitting: *Deleted

## 2014-12-13 MED ORDER — FLECAINIDE ACETATE 100 MG PO TABS: 100.0000 mg | ORAL_TABLET | Freq: Two times a day (BID) | ORAL | Status: DC

## 2014-12-13 MED ORDER — DILTIAZEM HCL ER BEADS 180 MG PO CP24: 180.0000 mg | ORAL_CAPSULE | Freq: Every day | ORAL | Status: DC

## 2014-12-14 ENCOUNTER — Encounter: Payer: Self-pay | Admitting: Cardiovascular Disease

## 2014-12-14 ENCOUNTER — Ambulatory Visit (INDEPENDENT_AMBULATORY_CARE_PROVIDER_SITE_OTHER): Payer: BLUE CROSS/BLUE SHIELD | Admitting: Cardiovascular Disease

## 2014-12-14 VITALS — BP 122/86 | HR 57 | Ht 74.0 in | Wt 337.0 lb

## 2014-12-14 DIAGNOSIS — Z6841 Body Mass Index (BMI) 40.0 and over, adult: Secondary | ICD-10-CM

## 2014-12-14 DIAGNOSIS — I10 Essential (primary) hypertension: Secondary | ICD-10-CM

## 2014-12-14 DIAGNOSIS — I4891 Unspecified atrial fibrillation: Secondary | ICD-10-CM

## 2014-12-14 NOTE — Assessment & Plan Note (Signed)
We have encouraged continued exercise, careful diet management in an effort to lose weight. 

## 2014-12-14 NOTE — Assessment & Plan Note (Signed)
No recent episodes of atrial fibrillation. No medication changes made. Currently not on anticoagulation 

## 2014-12-14 NOTE — Progress Notes (Signed)
Patient ID: Brandon [name], male    DOB: September 28, 1966, 49 y.o.   MRN: [id number]  HPI Comments: Brandon [name] is a 49 -year-old gentleman with history of hypertension, obesity, chronic back pain, atrial fibrillation who presents for routine followup of his atrial fibrillation.  In follow-up today, he reports that he is doing well. 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 Continued problems with arthritis. He is using a snore monitor on his phone to measure his snoring activity at nighttime. He seems to have snoring but is not bothered by this and reports sleeping relatively well. He attributes the snoring to sinus congestion which she has in the winter, better in the summer. At this time does not want a sleep study given his sinus congestion. Chronic cervical spine disease, history of cortisone shots. Otherwise he reports he is doing well with no complaints  EKG on today's visit shows normal sinus rhythm with rate 57 bpm, no significant ST or T-wave changes  Other past medical history  several years since his last episode of atrial fibrillation.   Cardiac catheterization may 2012 showed no significant CAD, normal ejection fraction greater than 55%. Cholesterol well controlled without any cluster medication  Allergies  Allergen Reactions  . Biaxin [Clarithromycin]     Pt has history of arrhythmias  . Penicillins     Outpatient Encounter Prescriptions as of 51/09/2015  Medication Sig  . aspirin 325 MG tablet Take 325 mg by mouth daily.    Marland Kitchen diltiazem (TAZTIA XT) 180 MG 24 hr capsule Take 1 capsule (180 mg total) by mouth daily.  . flecainide (TAMBOCOR) 100 MG tablet Take 1 tablet (100 mg total) by mouth 2 (two) times daily.  . fluticasone (FLONASE) 50 MCG/ACT nasal spray Place 2 sprays into both nostrils daily.  Marland Kitchen omeprazole (PRILOSEC) 20 MG capsule Take 20 mg by mouth daily.   . SUMAtriptan (IMITREX) 50 MG tablet Take 1 tablet (50 mg total) by mouth every 2 (two) hours as needed  for migraine.    Past Medical History  Diagnosis Date  . DIVERTICULITIS, COLON 06/07/2009  . DEPRESSION 06/07/2009  . CHICKENPOX, HX OF 06/07/2009  . ARTHRITIS 06/07/2009  . ATRIAL FIBRILLATION, PAROXYSMAL 06/07/2009  . Hematuria   . 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 Cervical disc disorder with radiculopathy of cervical region 10/05/2013  . Atrial fibrillation     Past Surgical History  Procedure Laterality Date  . Cardiac catheterization    . Tonsillectomy    . Foot surgery      left foot    Social History  reports that he has never smoked. He quit smokeless tobacco use about 3 years ago. He reports that he drinks alcohol. He reports that he does not use illicit drugs.  Family History family history includes Arthritis in an other family member; Diabetes in an other family member; Stroke in his father and another family member.   Review of Systems  Constitutional: Negative.   Respiratory: Negative.   Cardiovascular: Negative.   Gastrointestinal: Negative.   Musculoskeletal: Positive for arthralgias.  Skin: Negative.   Neurological: Negative.   Hematological: Negative.   Psychiatric/Behavioral: Negative.   All other systems reviewed and are negative.  BP 122/86 mmHg  Pulse 57  Ht 6\' 2"  (1.88 m)  Wt 337 lb (152.862 kg)  BMI 43.25 kg/m2  Physical Exam  Constitutional: He is oriented to person, place, and time. He appears well-developed and well-nourished.  HENT:  Head: Normocephalic.  Nose: Nose normal.  Mouth/Throat:  Oropharynx is clear and moist.  Eyes: Conjunctivae are normal. Pupils are equal, round, and reactive to light.  Neck: Normal range of motion. Neck supple. No JVD present.  Cardiovascular: Normal rate, regular rhythm, S1 normal, S2 normal, normal heart sounds and intact distal pulses.  Exam reveals no gallop and no friction rub.   No murmur heard. Pulmonary/Chest: Effort normal and breath sounds normal. No respiratory distress. He has no wheezes. He has no  rales. He exhibits no tenderness.  Abdominal: Soft. Bowel sounds are normal. He exhibits no distension. There is no tenderness.  Musculoskeletal: Normal range of motion. He exhibits no edema or tenderness.  Lymphadenopathy:    He has no cervical adenopathy.  Neurological: He is alert and oriented to person, place, and time. Coordination normal.  Skin: Skin is warm and dry. No rash noted. No erythema.  Psychiatric: He has a normal mood and affect. His behavior is normal. Judgment and thought content normal.      Assessment and Plan   Nursing note and vitals reviewed.

## 2014-12-14 NOTE — Patient Instructions (Signed)
You are doing well. No medication changes were made.  Please call us if you have new issues that need to be addressed before your next appt.  Your physician wants you to follow-up in: 12 months.  You will receive a reminder letter in the mail two months in advance. If you don't receive a letter, please call our office to schedule the follow-up appointment. 

## 2014-12-14 NOTE — Assessment & Plan Note (Signed)
Blood pressure is well controlled on today's visit. No changes made to the medications. 

## 2015-01-03 ENCOUNTER — Encounter: Payer: Self-pay | Admitting: Family Medicine

## 2015-01-03 ENCOUNTER — Ambulatory Visit (INDEPENDENT_AMBULATORY_CARE_PROVIDER_SITE_OTHER): Payer: BLUE CROSS/BLUE SHIELD | Admitting: Family Medicine

## 2015-01-03 VITALS — BP 120/78 | HR 64 | Temp 97.6°F | Ht 74.0 in | Wt 340.8 lb

## 2015-01-03 DIAGNOSIS — C4491 Basal cell carcinoma of skin, unspecified: Secondary | ICD-10-CM

## 2015-01-03 DIAGNOSIS — J0100 Acute maxillary sinusitis, unspecified: Secondary | ICD-10-CM

## 2015-01-03 MED ORDER — DOXYCYCLINE HYCLATE 100 MG PO TABS: 100.0000 mg | ORAL_TABLET | Freq: Two times a day (BID) | ORAL | Status: DC

## 2015-01-03 NOTE — Progress Notes (Signed)
Dr. Frederico Hamman T. Renelle Stegenga, MD, [facility] and [facility] Medicine [place], [address] Phone: [phone] Fax: [phone]  01/03/2015  Patient: Brandon [name], MRN: [id number], DOB: 1966/07/21, 49 y.o.  Primary Physician:  Owens Loffler, MD  Chief Complaint: Area on Back on Neck and Sinusitis  Subjective:   Brandon [name] is a 49 y.o. very pleasant male patient who presents with the following:  BCC base of neck and R arm. These have been there for at least several months, and one of his neck has bled some and that healed, but there are palpable and circumscribed with pearly base.  Acute sinusitis. Pain in his face and his frontal sinus maxillary sinus ongoing since December. He did have a cold when I saw him in late December, but his symptoms of been persistent and bothersome.   Past Medical History, Surgical History, Social History, Family History, Problem List, Medications, and Allergies have been reviewed and updated if relevant.  ROS: GEN: Acute illness details above GI: Tolerating PO intake GU: maintaining adequate hydration and urination Pulm: No SOB Interactive and getting along well at home.  Otherwise, ROS is as per the HPI.   Objective:   BP 120/78 mmHg  Pulse 64  Temp(Src) 97.6 F (36.4 C) (Oral)  Ht 6\' 2"  (1.88 m)  Wt 340 lb 12 oz (154.563 kg)  BMI 43.73 kg/m2   Gen: WDWN, NAD; alert,appropriate and cooperative throughout exam  HEENT: Normocephalic and atraumatic. Throat clear, w/o exudate, no LAD, R TM clear, L TM - good landmarks, No fluid present. rhinnorhea.  Left frontal and maxillary sinuses: Tender Right frontal and maxillary sinuses: Tender  Neck: No ant or post LAD CV: RRR, No M/G/R Pulm: Breathing comfortably in no resp distress. no w/c/r Abd: S,NT,ND,+BS Extr: no c/c/e Psych: full affect, pleasant  The patient is neck he has a small area that is well-circumscribed, elevated and pearly with a central  ulcer. On his right arm there is an area slightly larger but less elevated that is also elevated and pearly.   Laboratory and Imaging Data:  Assessment and Plan:   Acute maxillary sinusitis, recurrence not specified  Basal cell carcinoma - Plan: Ambulatory referral to Dermatology   Acute sinusitis: ABX as below.   Reviewed symptomatic care as well as ABX in this case.    2 areas of probable basal cell carcinoma, possible squamous cell carcinoma, consult derm. One location on the neck.  Follow-up: No Follow-up on file.  New Prescriptions   DOXYCYCLINE (VIBRA-TABS) 100 MG TABLET    Take 1 tablet (100 mg total) by mouth 2 (two) times daily.   Orders Placed This Encounter  Procedures  . Ambulatory referral to Dermatology    Signed,  Frederico Hamman T. Johnette Teigen, MD   Patient's Medications  New Prescriptions   DOXYCYCLINE (VIBRA-TABS) 100 MG TABLET    Take 1 tablet (100 mg total) by mouth 2 (two) times daily.  Previous Medications   ASPIRIN 325 MG TABLET    Take 325 mg by mouth daily.     DILTIAZEM (TAZTIA XT) 180 MG 24 HR CAPSULE    Take 1 capsule (180 mg total) by mouth daily.   FLECAINIDE (TAMBOCOR) 100 MG TABLET    Take 1 tablet (100 mg total) by mouth 2 (two) times daily.   OMEPRAZOLE (PRILOSEC) 20 MG CAPSULE    Take 20 mg by mouth daily.    SUMATRIPTAN (IMITREX) 50 MG TABLET    Take 1  tablet (50 mg total) by mouth every 2 (two) hours as needed for migraine.  Modified Medications   No medications on file  Discontinued Medications   FLUTICASONE (FLONASE) 50 MCG/ACT NASAL SPRAY    Place 2 sprays into both nostrils daily.

## 2015-01-03 NOTE — Progress Notes (Signed)
Pre visit review using our clinic review tool, if applicable. No additional management support is needed unless otherwise documented below in the visit note. 

## 2015-01-03 NOTE — Patient Instructions (Signed)

## 2015-01-05 ENCOUNTER — Emergency Department: Payer: Self-pay | Admitting: Emergency Medicine

## 2015-02-02 ENCOUNTER — Telehealth: Payer: Self-pay | Admitting: Family Medicine

## 2015-02-02 NOTE — Telephone Encounter (Signed)
Patient Name: ZYIER DYKEMA DOB: May 02, 1966 Initial Comment Caller states he feels swimmy-headed, dizzy, nauseated. Nurse Assessment Nurse: Marcelline Deist, RN, Lynda Date/Time (Eastern Time): 02/02/2015 9:47:00 AM Confirm and document reason for call. If symptomatic, describe symptoms. ---Caller states he feels swimmy-headed, dizzy, nauseated. Has been going on for awhile, maybe a week or more. Has had sinus issues for about a month, using Afrin & using nasal washes. No fever. Took an antibiotic a couple weeks ago after seeing Dr., no improvement. Had a sore throat, slight cough. Has the patient traveled out of the country within the last 30 days? ---Not Applicable Does the patient require triage? ---Yes Related visit to physician within the last 2 weeks? ---Yes Does the PT have any chronic conditions? (i.e. diabetes, asthma, etc.) ---Yes List chronic conditions. ---A fib, on ASA, BP rx Guidelines Guideline Title Affirmed Question Affirmed Notes Dizziness - Lightheadedness [1] MODERATE dizziness (e.g., interferes with normal activities) AND [2] has NOT been evaluated by physician for this (Exception: dizziness caused by heat exposure, sudden standing, or poor fluid intake) Final Disposition User See Physician within [place], RN, Kermit Balo Comments Caller states his sinus symptoms have persisted, not resolved after round of antibiotics. Caller mentioned that a couple weeks ago, he was walking across a room & felt a sudden pain in chest that went to his back & his back pain lasted most of the day. Has had this happen before. Has been through heart cath, everything checked out fine.

## 2015-02-05 NOTE — Telephone Encounter (Signed)
Spoke with Brandon [name].  He was seen at [facility] walk in clinic this weekend.  He was given another antibiotic, something for dizziness and a nose spray.   He states his head still feels off but he probably hasn't been on the medication long enough to see results.  Advised to call us back if no improvement in a few days on his new medications.

## 2015-02-05 NOTE — Telephone Encounter (Signed)
Can you check on him? It sounds like he may have some post-infectious labyrinthitis / vertigo.   He has had vertigo in the past.   I would simply use some OTC dramamine non-drowsy up to 3 times a day - will likely help.

## 2015-03-21 ENCOUNTER — Other Ambulatory Visit (INDEPENDENT_AMBULATORY_CARE_PROVIDER_SITE_OTHER): Payer: BLUE CROSS/BLUE SHIELD

## 2015-03-21 ENCOUNTER — Encounter: Payer: Self-pay | Admitting: Family Medicine

## 2015-03-21 ENCOUNTER — Ambulatory Visit (INDEPENDENT_AMBULATORY_CARE_PROVIDER_SITE_OTHER): Payer: BLUE CROSS/BLUE SHIELD | Admitting: Family Medicine

## 2015-03-21 VITALS — BP 128/86 | HR 73 | Temp 98.2°F | Ht 74.0 in | Wt 331.2 lb

## 2015-03-21 DIAGNOSIS — F329 Major depressive disorder, single episode, unspecified: Secondary | ICD-10-CM

## 2015-03-21 DIAGNOSIS — Z79899 Other long term (current) drug therapy: Secondary | ICD-10-CM | POA: Diagnosis not present

## 2015-03-21 DIAGNOSIS — Z125 Encounter for screening for malignant neoplasm of prostate: Secondary | ICD-10-CM | POA: Diagnosis not present

## 2015-03-21 DIAGNOSIS — J069 Acute upper respiratory infection, unspecified: Secondary | ICD-10-CM | POA: Diagnosis not present

## 2015-03-21 DIAGNOSIS — Z13220 Encounter for screening for lipoid disorders: Secondary | ICD-10-CM

## 2015-03-21 DIAGNOSIS — I10 Essential (primary) hypertension: Secondary | ICD-10-CM

## 2015-03-21 DIAGNOSIS — F32A Depression, unspecified: Secondary | ICD-10-CM

## 2015-03-21 LAB — LIPID PANEL
CHOLESTEROL: 134 mg/dL (ref 0–200)
HDL: 25.5 mg/dL — AB (ref >39.00)
LDL Cholesterol: 86 mg/dL (ref 0–99)
NonHDL: 108.5
TRIGLYCERIDES: 115 mg/dL (ref 0.0–149.0)
Total CHOL/HDL Ratio: 5
VLDL: 23 mg/dL (ref 0.0–40.0)

## 2015-03-21 LAB — CBC WITH DIFFERENTIAL/PLATELET
BASOS ABS: 0 10*3/uL (ref 0.0–0.1)
Basophils Relative: 0.7 % (ref 0.0–3.0)
Eosinophils Absolute: 0.3 10*3/uL (ref 0.0–0.7)
Eosinophils Relative: 4.5 % (ref 0.0–5.0)
HCT: 44.5 % (ref 39.0–52.0)
Hemoglobin: 15.1 g/dL (ref 13.0–17.0)
LYMPHS PCT: 22.3 % (ref 12.0–46.0)
Lymphs Abs: 1.5 10*3/uL (ref 0.7–4.0)
MCHC: 33.9 g/dL (ref 30.0–36.0)
MCV: 85.1 fl (ref 78.0–100.0)
MONOS PCT: 8.5 % (ref 3.0–12.0)
Monocytes Absolute: 0.6 10*3/uL (ref 0.1–1.0)
NEUTROS ABS: 4.3 10*3/uL (ref 1.4–7.7)
NEUTROS PCT: 64 % (ref 43.0–77.0)
PLATELETS: 236 10*3/uL (ref 150.0–400.0)
RBC: 5.22 Mil/uL (ref 4.22–5.81)
RDW: 14.7 % (ref 11.5–15.5)
WBC: 6.7 10*3/uL (ref 4.0–10.5)

## 2015-03-21 LAB — PSA: PSA: 0.59 ng/mL (ref 0.10–4.00)

## 2015-03-21 LAB — BASIC METABOLIC PANEL
BUN: 9 mg/dL (ref 6–23)
CALCIUM: 9 mg/dL (ref 8.4–10.5)
CO2: 28 meq/L (ref 19–32)
Chloride: 105 mEq/L (ref 96–112)
Creatinine, Ser: 0.87 mg/dL (ref 0.40–1.50)
GFR: 98.25 mL/min (ref >60.00)
GLUCOSE: 90 mg/dL (ref 70–99)
Potassium: 4.5 mEq/L (ref 3.5–5.1)
Sodium: 138 mEq/L (ref 135–145)

## 2015-03-21 LAB — HEPATIC FUNCTION PANEL
ALBUMIN: 3.9 g/dL (ref 3.5–5.2)
ALT: 25 U/L (ref 0–53)
AST: 19 U/L (ref 0–37)
Alkaline Phosphatase: 48 U/L (ref 39–117)
Bilirubin, Direct: 0.1 mg/dL (ref 0.0–0.3)
TOTAL PROTEIN: 6.7 g/dL (ref 6.0–8.3)
Total Bilirubin: 0.5 mg/dL (ref 0.2–1.2)

## 2015-03-21 LAB — TSH: TSH: 3.21 u[IU]/mL (ref 0.35–4.50)

## 2015-03-21 NOTE — Progress Notes (Signed)
   Dr. Frederico Hamman T. Moksh Loomer, MD, [facility] and Sports Medicine [place], [address] Phone: [phone] Fax: [phone]  03/21/2015  Patient: Brandon [name], MRN: [id number], DOB: 05-02-1966, 49 y.o.  Primary Physician:  Owens Loffler, MD  Chief Complaint: Cough; Sore Throat; Nasal Congestion; and Headache  Subjective:   This 49 y.o. male patient presents with runny nose, sneezing, cough, sore throat, malaise and minimal / low-grade fever .   Sick for about 5-6 days, started on Saturday.  ENT did not see any infection in sinuses last month.  Taking Zyrtec for allergies.   ? recent exposure to others with similar symptoms.   The patent denies sore throat as the primary complaint. Denies sthortness of breath/wheezing, high fever, chest pain, rhinits for more than 14 days, significant myalgia, otalgia, facial pain, abdominal pain, changes in bowel or bladder.  PMH, PHS, Allergies, Problem List, Medications, Family History, and Social History have all been reviewed.  ROS as above, eating and drinking - tolerating PO. Urinating normally. No excessive vomitting or diarrhea. O/w as above.  Objective:   Blood pressure 128/86, pulse 73, temperature 98.2 F (36.8 C), temperature source Oral, height 6\' 2"  (1.88 m), weight 331 lb 4 oz (150.254 kg), SpO2 96 %.  GEN: WDWN, Non-toxic, Atraumatic, normocephalic. A and O x 3. HEENT: Oropharynx clear without exudate, MMM, no significant LAD, mild rhinnorhea Ears: TM clear, COL visualized with good landmarks CV: RRR, no m/g/r. Pulm: CTA B, no wheezes, rhonchi, or crackles, normal respiratory effort. EXT: no c/c/e Psych: well oriented, neither depressed nor anxious in appearance  Objective Data:  Assessment and Plan:   URI (upper respiratory infection)  Supportive care reviewed with patient. See patient instruction section.  Follow-up: No Follow-up on file.  New Prescriptions   No  medications on file   No orders of the defined types were placed in this encounter.    Signed,  Maud Deed. Mal Asher, MD   Patient's Medications  New Prescriptions   No medications on file  Previous Medications   ASPIRIN 325 MG TABLET    Take 325 mg by mouth daily.     DILTIAZEM (TAZTIA XT) 180 MG 24 HR CAPSULE    Take 1 capsule (180 mg total) by mouth daily.   FLECAINIDE (TAMBOCOR) 100 MG TABLET    Take 1 tablet (100 mg total) by mouth 2 (two) times daily.   OMEPRAZOLE (PRILOSEC) 20 MG CAPSULE    Take 20 mg by mouth daily.    SUMATRIPTAN (IMITREX) 50 MG TABLET    Take 1 tablet (50 mg total) by mouth every 2 (two) hours as needed for migraine.   TRIAMCINOLONE (NASACORT) 55 MCG/ACT AERO NASAL INHALER    Place into the nose.  Modified Medications   No medications on file  Discontinued Medications   DOXYCYCLINE (VIBRA-TABS) 100 MG TABLET    Take 1 tablet (100 mg total) by mouth 2 (two) times daily.

## 2015-03-21 NOTE — Progress Notes (Signed)
Pre visit review using our clinic review tool, if applicable. No additional management support is needed unless otherwise documented below in the visit note. 

## 2015-03-28 ENCOUNTER — Ambulatory Visit (INDEPENDENT_AMBULATORY_CARE_PROVIDER_SITE_OTHER): Payer: BLUE CROSS/BLUE SHIELD | Admitting: Family Medicine

## 2015-03-28 ENCOUNTER — Encounter: Payer: Self-pay | Admitting: Family Medicine

## 2015-03-28 VITALS — BP 116/78 | HR 83 | Temp 98.6°F | Ht 72.0 in | Wt 325.5 lb

## 2015-03-28 DIAGNOSIS — Z0000 Encounter for general adult medical examination without abnormal findings: Secondary | ICD-10-CM | POA: Diagnosis not present

## 2015-03-28 NOTE — Progress Notes (Signed)
Dr. Frederico Hamman T. Cyleigh Massaro, MD, [facility] [place], [address] Phone: [phone] Fax: [phone]  03/28/2015  Patient: Brandon [name], MRN: [id number], DOB: [birth year], 49 y.o.  Primary Physician:  Owens Loffler, MD  Chief Complaint: Annual Exam  Subjective:   Brandon [name] is a 49 y.o. pleasant patient who presents with the following:  Preventative Health Maintenance Visit:  Health Maintenance Summary Reviewed and updated, unless pt declines services.  Tobacco History Reviewed. Dips about once a day Alcohol: No concerns, no excessive use Exercise Habits: Some activity, rec at least 30 mins 5 times a week STD concerns: no risk or activity to increase risk Drug Use: None Encouraged self-testicular check  Recent URI  Has been dizzy some Nystagmus   Health Maintenance  Topic Date Due  . HIV Screening  01/08/1979  . INFLUENZA VACCINE  06/04/2015  . TETANUS/TDAP  12/04/2020  . COLONOSCOPY  05/15/2024   Immunization History  Administered Date(s) Administered  . Influenza Whole 11/15/2009, 07/14/2011  . Influenza, Seasonal, Injecte, Preservative Fre 11/08/2012  . Influenza,inj,Quad PF,36+ Mos 08/24/2013, 08/16/2014  . Td 12/04/2010   Patient Active Problem List   Diagnosis Date Noted  . Cervical disc disorder with radiculopathy of cervical region 10/05/2013  . Carpal tunnel syndrome 10/05/2013  . Essential hypertension 06/16/2013  . 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 Basal cell carcinoma 06/25/2011  . Erectile dysfunction 06/09/2011  . Morbid obesity with BMI of 40.0-44.9, adult 06/07/2009  . DEPRESSION 06/07/2009  . ATRIAL FIBRILLATION, PAROXYSMAL 06/07/2009  . DIVERTICULITIS, COLON 06/07/2009  . ARTHRITIS 06/07/2009   Past Medical History  Diagnosis Date  . DIVERTICULITIS, COLON 06/07/2009  . DEPRESSION 06/07/2009  . CHICKENPOX, HX OF 06/07/2009  . ARTHRITIS 06/07/2009  . ATRIAL FIBRILLATION,  PAROXYSMAL 06/07/2009  . Hematuria   . 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 Cervical disc disorder with radiculopathy of cervical region 10/05/2013  . Atrial fibrillation    Past Surgical History  Procedure Laterality Date  . Cardiac catheterization    . Tonsillectomy    . Foot surgery      left foot   History   Social History  . Marital Status: Single    Spouse Name: N/A  . Number of Children: N/A  . Years of Education: N/A   Occupational History  . self employed    Social History Main Topics  . Smoking status: Never Smoker   . Smokeless tobacco: Former Systems developer    Quit date: 06/17/2011     Comment: occasionally  . Alcohol Use: Yes     Comment: occ  . Drug Use: No  . Sexual Activity: Not on file   Other Topics Concern  . Not on file   Social History Narrative   Does not do regular exercise. Former dip - quit about 1 year ago.    Family History  Problem Relation Age of Onset  . Arthritis    . Diabetes      1st degree relative  . Stroke      F 1st degree relative <60; M 1st degree relative <50  . Stroke Father    Allergies  Allergen Reactions  . Biaxin [Clarithromycin]     Pt has history of arrhythmias  . Penicillins     Medication list has been reviewed and updated.   General: Denies fever, chills, sweats. No significant weight loss. Eyes: Denies blurring,significant itching ENT: Denies earache, sore throat, and hoarseness. Cardiovascular: Denies chest pains,  palpitations, dyspnea on exertion Respiratory: Denies cough, dyspnea at rest,wheeezing Breast: no concerns about lumps GI: Denies nausea, vomiting, diarrhea, constipation, change in bowel habits, abdominal pain, melena, hematochezia GU: Denies penile discharge, ED, urinary flow / outflow problems. No STD concerns. Musculoskeletal: Denies back pain, joint pain Derm: Denies rash, itching Neuro: Denies  paresthesias, frequent falls, frequent headaches Psych: Denies depression, anxiety Endocrine: Denies cold  intolerance, heat intolerance, polydipsia Heme: Denies enlarged lymph nodes Allergy: No hayfever  Objective:   BP 116/78 mmHg  Pulse 83  Temp(Src) 98.6 F (37 C) (Oral)  Ht 6' (1.829 m)  Wt 325 lb 8 oz (147.646 kg)  BMI 44.14 kg/m2 Ideal Body Weight: Weight in (lb) to have BMI = 25: 183.9  No exam data present  GEN: well developed, well nourished, no acute distress Eyes: conjunctiva and lids normal, PERRLA, EOMI ENT: TM clear, nares clear, oral exam WNL Neck: supple, no lymphadenopathy, no thyromegaly, no JVD Pulm: clear to auscultation and percussion, respiratory effort normal CV: regular rate and rhythm, S1-S2, no murmur, rub or gallop, no bruits, peripheral pulses normal and symmetric, no cyanosis, clubbing, edema or varicosities GI: soft, non-tender; no hepatosplenomegaly, masses; active bowel sounds all quadrants GU: no hernia, testicular mass, penile discharge Lymph: no cervical, axillary or inguinal adenopathy MSK: gait normal, muscle tone and strength WNL, no joint swelling, effusions, discoloration, crepitus  SKIN: clear, good turgor, color WNL, no rashes, lesions, or ulcerations Neuro: normal mental status, normal strength, sensation, and motion Psych: alert; oriented to person, place and time, normally interactive and not anxious or depressed in appearance. All labs reviewed with patient.  Lipids:    Component Value Date/Time   CHOL 134 03/21/2015 0946   TRIG 115.0 03/21/2015 0946   HDL 25.50* 03/21/2015 0946   VLDL 23.0 03/21/2015 0946   CHOLHDL 5 03/21/2015 0946   CBC: CBC Latest Ref Rng 03/21/2015 03/13/2014 11/08/2012  WBC 4.0 - 10.5 K/uL 6.7 6.7 7.2  Hemoglobin 13.0 - 17.0 g/dL 15.1 14.7 15.4  Hematocrit 39.0 - 52.0 % 44.5 43.7 45.5  Platelets 150.0 - 400.0 K/uL 236.0 234.0 751.7    Basic Metabolic Panel:    Component Value Date/Time   NA 138 03/21/2015 0946   K 4.5 03/21/2015 0946   CL 105 03/21/2015 0946   CO2 28 03/21/2015 0946   BUN 9 03/21/2015  0946   CREATININE 0.87 03/21/2015 0946   GLUCOSE 90 03/21/2015 0946   CALCIUM 9.0 03/21/2015 0946   Hepatic Function Latest Ref Rng 03/21/2015 07/03/2014 03/13/2014  Total Protein 6.0 - 8.3 g/dL 6.7 7.8 6.5  Albumin 3.5 - 5.2 g/dL 3.9 3.9 3.5  AST 0 - 37 U/L '19 27 16  ' ALT 0 - 53 U/L 25 34 25  Alk Phosphatase 39 - 117 U/L 48 42 43  Total Bilirubin 0.2 - 1.2 mg/dL 0.5 0.9 0.7  Bilirubin, Direct 0.0 - 0.3 mg/dL 0.1 0.1 0.1    Lab Results  Component Value Date   TSH 3.21 03/21/2015   Lab Results  Component Value Date   PSA 0.59 03/21/2015   PSA 0.58 03/13/2014   PSA 0.61 11/08/2012    Assessment and Plan:   Routine general medical examination at a health care facility  Health Maintenance Exam: The patient's preventative maintenance and recommended screening tests for an annual wellness exam were reviewed in full today. Brought up to date unless services declined.  Counselled on the importance of diet, exercise, and its role in overall health and mortality. The  patient's FH and SH was reviewed, including their home life, tobacco status, and drug and alcohol status.  Keep working on weight.  Follow-up: Return in 1 year (on 03/27/2016). Unless noted, follow-up in 1 year for Health Maintenance Exam.  New Prescriptions   No medications on file   No orders of the defined types were placed in this encounter.    Signed,  Maud Deed. Eluzer Howdeshell, MD   Patient's Medications  New Prescriptions   No medications on file  Previous Medications   ASPIRIN 325 MG TABLET    Take 325 mg by mouth daily.     DILTIAZEM (TAZTIA XT) 180 MG 24 HR CAPSULE    Take 1 capsule (180 mg total) by mouth daily.   FLECAINIDE (TAMBOCOR) 100 MG TABLET    Take 1 tablet (100 mg total) by mouth 2 (two) times daily.   OMEPRAZOLE (PRILOSEC) 40 MG CAPSULE    Take 40 mg by mouth daily.   SUMATRIPTAN (IMITREX) 50 MG TABLET    Take 1 tablet (50 mg total) by mouth every 2 (two) hours as needed for migraine.    TRIAMCINOLONE (NASACORT) 55 MCG/ACT AERO NASAL INHALER    Place into the nose.  Modified Medications   No medications on file  Discontinued Medications   OMEPRAZOLE (PRILOSEC) 20 MG CAPSULE    Take 20 mg by mouth daily.

## 2015-03-28 NOTE — Progress Notes (Signed)
Pre visit review using our clinic review tool, if applicable. No additional management support is needed unless otherwise documented below in the visit note. 

## 2015-08-20 ENCOUNTER — Ambulatory Visit (INDEPENDENT_AMBULATORY_CARE_PROVIDER_SITE_OTHER): Payer: BLUE CROSS/BLUE SHIELD

## 2015-08-20 DIAGNOSIS — Z23 Encounter for immunization: Secondary | ICD-10-CM

## 2015-10-11 ENCOUNTER — Telehealth: Payer: Self-pay | Admitting: *Deleted

## 2015-10-11 NOTE — Telephone Encounter (Signed)
Per front desk, pt can't come in today, he will be here tomorrow @ 8:40 for nurse visit.

## 2015-10-11 NOTE — Telephone Encounter (Signed)
Pt calling stating that his heart beat goes normal and then give a big "Beat"  He is concerned about this and would like to know what to do Denied SOB, Chest Pain, Dizzy spells Has not changed any new medications  This is been going on couple days.  Has not checked BP  Please advise

## 2015-10-11 NOTE — Telephone Encounter (Signed)
Pt needs an EKG. Can we see if he wants to come over now for a nurse visit?

## 2015-10-12 ENCOUNTER — Ambulatory Visit (INDEPENDENT_AMBULATORY_CARE_PROVIDER_SITE_OTHER): Payer: BLUE CROSS/BLUE SHIELD

## 2015-10-12 VITALS — BP 157/87 | HR 69 | Resp 16 | Ht 74.0 in | Wt 332.1 lb

## 2015-10-12 DIAGNOSIS — I4891 Unspecified atrial fibrillation: Secondary | ICD-10-CM

## 2015-10-12 NOTE — Patient Instructions (Addendum)
1.) Reason for visit: EKG  2.) Name of person requesting visit: [facility]   3). History:  Pt has history of afib. Takes diltiazem 180mg  qd and flecainide 100mg  BID   4). ROS related to problem: Pt called the office yesterday to report his heart rate is normal then he feels a "big beat". States this happened approximately 10 times. Resolved on its own. Denies chest pain, nausea, diaphoresis. Pt has history of afib and states he has been taking meds as prescribed w/no missed doses. He mentioned taking his blood pressure at home which he states was in the 180s yesterday after walking from one room to the other. He does not take BP medications. BP today 157/87  3.) Assessment and plan per MD: EKG performed. Per Dr. Rockey Situ, could be PVCs he felt. Continue to monitor and notify us if they become frequent. At that point, pt could need holter monitor. Continue to monitor BP and take meds as prescribed.   I reviewed low sodium diet w/pt and need to monitor BP

## 2015-10-17 ENCOUNTER — Encounter: Payer: Self-pay | Admitting: Cardiovascular Disease

## 2015-12-14 ENCOUNTER — Encounter: Payer: Self-pay | Admitting: Cardiovascular Disease

## 2015-12-14 ENCOUNTER — Ambulatory Visit (INDEPENDENT_AMBULATORY_CARE_PROVIDER_SITE_OTHER): Payer: BLUE CROSS/BLUE SHIELD | Admitting: Cardiovascular Disease

## 2015-12-14 VITALS — BP 136/82 | HR 66 | Ht 74.0 in | Wt 327.0 lb

## 2015-12-14 DIAGNOSIS — Z6841 Body Mass Index (BMI) 40.0 and over, adult: Secondary | ICD-10-CM

## 2015-12-14 DIAGNOSIS — I10 Essential (primary) hypertension: Secondary | ICD-10-CM | POA: Diagnosis not present

## 2015-12-14 DIAGNOSIS — I4891 Unspecified atrial fibrillation: Secondary | ICD-10-CM | POA: Diagnosis not present

## 2015-12-14 DIAGNOSIS — R002 Palpitations: Secondary | ICD-10-CM

## 2015-12-14 DIAGNOSIS — I493 Ventricular premature depolarization: Secondary | ICD-10-CM

## 2015-12-14 NOTE — Assessment & Plan Note (Signed)
We have encouraged continued exercise, careful diet management in an effort to lose weight. 

## 2015-12-14 NOTE — Progress Notes (Signed)
Patient ID: Brandon [name], male    DOB: Jan 02, 1966, 50 y.o.   MRN: [id number]  HPI Comments: Brandon [name] is a 50 -year-old gentleman with history of hypertension, obesity, chronic back pain, atrial fibrillation who presents for routine followup of his atrial fibrillation.  In follow-up, he reports having no symptoms concerning for atrial fibrillation He is having symptoms of palpitations, strong beats Was seen one month ago for EKG which was normal but described rare very strong beats with pauses. Felt to have PVCs. Otherwise reports he is doing well, no complaints  Continues to take flecainide, diltiazem.  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 Continued problems with arthritis.  Other past medical history  several years since his last episode of atrial fibrillation.   Cardiac catheterization may 2012 showed no significant CAD, normal ejection fraction greater than 55%. Cholesterol well controlled without any cluster medication  Allergies  Allergen Reactions  . Biaxin [Clarithromycin]     Pt has history of arrhythmias  . Penicillins     Outpatient Encounter Prescriptions as of 12/14/2015  Medication Sig  . aspirin 325 MG tablet Take 325 mg by mouth daily.    Marland Kitchen diltiazem (TAZTIA XT) 180 MG 24 hr capsule Take 1 capsule (180 mg total) by mouth daily.  . flecainide (TAMBOCOR) 100 MG tablet Take 1 tablet (100 mg total) by mouth 2 (two) times daily.  Marland Kitchen omeprazole (PRILOSEC) 40 MG capsule Take 40 mg by mouth daily.  . SUMAtriptan (IMITREX) 50 MG tablet Take 1 tablet (50 mg total) by mouth every 2 (two) hours as needed for migraine.  . triamcinolone (NASACORT) 55 MCG/ACT AERO nasal inhaler Place into the nose.   No facility-administered encounter medications on file as of 12/14/2015.    Past Medical History  Diagnosis Date  .  DIVERTICULITIS, COLON 06/07/2009  . DEPRESSION 06/07/2009  . CHICKENPOX, HX OF 06/07/2009  . ARTHRITIS 06/07/2009  . ATRIAL FIBRILLATION, PAROXYSMAL 06/07/2009  . Hematuria   . 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 Cervical disc disorder with radiculopathy of cervical region 10/05/2013  . Atrial fibrillation [facility])     Past Surgical History  Procedure Laterality Date  . Cardiac catheterization    . Tonsillectomy    . Foot surgery      left foot    Social History  reports that he has never smoked. His smokeless tobacco use includes Snuff. He reports that he drinks alcohol. He reports that he does not use illicit drugs.  Family History family history includes Stroke in his father.   Review of Systems  Constitutional: Negative.   Respiratory: Negative.   Cardiovascular: Positive for palpitations.  Gastrointestinal: Negative.   Musculoskeletal: Positive for arthralgias.  Skin: Negative.   Neurological: Negative.   Hematological: Negative.   Psychiatric/Behavioral: Negative.   All other systems reviewed and are negative.  BP 136/82 mmHg  Pulse 66  Ht 6\' 2"  (1.88 m)  Wt 327 lb (148.326 kg)  BMI 41.97 kg/m2  Physical Exam  Constitutional: He is oriented to person, place, and time. He appears well-developed and well-nourished.  Obese  HENT:  Head: Normocephalic.  Nose: Nose normal.  Mouth/Throat: Oropharynx is clear and moist.  Eyes: Conjunctivae are normal. Pupils are equal, round, and reactive to light.  Neck: Normal range of motion. Neck supple. No JVD present.  Cardiovascular: Normal rate, regular rhythm,  S1 normal, S2 normal, normal heart sounds and intact distal pulses.  Exam reveals no gallop and no friction rub.   No murmur heard. Pulmonary/Chest: Effort normal and breath sounds normal. No respiratory distress. He has no wheezes. He has no rales. He exhibits no tenderness.  Abdominal: Soft. Bowel sounds are normal. He exhibits no distension. There is no tenderness.   Musculoskeletal: Normal range of motion. He exhibits no edema or tenderness.  Lymphadenopathy:    He has no cervical adenopathy.  Neurological: He is alert and oriented to person, place, and time. Coordination normal.  Skin: Skin is warm and dry. No rash noted. No erythema.  Psychiatric: He has a normal mood and affect. His behavior is normal. Judgment and thought content normal.      Assessment and Plan   Nursing note and vitals reviewed.

## 2015-12-14 NOTE — Assessment & Plan Note (Signed)
No recent episodes of atrial fibrillation. No medication changes made. Currently not on anticoagulation

## 2015-12-14 NOTE — Patient Instructions (Addendum)
You are doing well. No medication changes were made.  If strong beat come back again (likely PVCs) Take metoprolol one pill up to twice a day  Please call us if you have new issues that need to be addressed before your next appt.  Your physician wants you to follow-up in: 12 months.  You will receive a reminder letter in the mail two months in advance. If you don't receive a letter, please call our office to schedule the follow-up appointment.

## 2015-12-14 NOTE — Assessment & Plan Note (Signed)
Symptoms suggestive of PVCs, unable to exclude APCs Recommended he take metoprolol tartrate as needed for days when he has significant symptoms

## 2015-12-14 NOTE — Assessment & Plan Note (Signed)
Blood pressure is well controlled on today's visit. No changes made to the medications. 

## 2015-12-21 ENCOUNTER — Telehealth: Payer: Self-pay | Admitting: Cardiovascular Disease

## 2015-12-21 NOTE — Telephone Encounter (Signed)
Pharmacy calling stating pt went there and is there To pick up a new medication we put him on States it was sent on the 12/14/15  Please call

## 2015-12-21 NOTE — Telephone Encounter (Signed)
Pt's AVS states   "If strong beat come back again (likely PVCs) Take metoprolol one pill up to twice a day"  Pt is at pharmacy to p/u metoprolol, but it was not sent in. What strength would you like for him to have?

## 2015-12-24 ENCOUNTER — Other Ambulatory Visit: Payer: Self-pay | Admitting: Cardiovascular Disease

## 2015-12-24 MED ORDER — METOPROLOL TARTRATE 25 MG PO TABS
25.0000 mg | ORAL_TABLET | Freq: Two times a day (BID) | ORAL | Status: DC | PRN
Start: 2015-12-24 — End: 2016-09-19

## 2015-12-24 NOTE — Telephone Encounter (Signed)
I will send in a prescription for 25 mg twice a day as needed

## 2016-03-12 ENCOUNTER — Ambulatory Visit (INDEPENDENT_AMBULATORY_CARE_PROVIDER_SITE_OTHER)
Admission: RE | Admit: 2016-03-12 | Discharge: 2016-03-12 | Disposition: A | Discharge status: Home / Self Care | Payer: BLUE CROSS/BLUE SHIELD | Source: Ambulatory Visit | Attending: Family Medicine | Admitting: Family Medicine

## 2016-03-12 ENCOUNTER — Ambulatory Visit (INDEPENDENT_AMBULATORY_CARE_PROVIDER_SITE_OTHER): Payer: BLUE CROSS/BLUE SHIELD | Admitting: Family Medicine

## 2016-03-12 ENCOUNTER — Encounter: Payer: Self-pay | Admitting: Family Medicine

## 2016-03-12 VITALS — BP 120/80 | HR 70 | Temp 98.2°F | Ht 74.0 in | Wt 334.5 lb

## 2016-03-12 DIAGNOSIS — M25562 Pain in left knee: Secondary | ICD-10-CM | POA: Diagnosis not present

## 2016-03-12 DIAGNOSIS — M2392 Unspecified internal derangement of left knee: Secondary | ICD-10-CM | POA: Diagnosis not present

## 2016-03-12 MED ORDER — METHYLPREDNISOLONE ACETATE 40 MG/ML IJ SUSP
80.0000 mg | Freq: Once | INTRAMUSCULAR | Status: AC
  Administered 2016-03-12: 80 mg via INTRA_ARTICULAR

## 2016-03-12 NOTE — Progress Notes (Signed)
Pre visit review using our clinic review tool, if applicable. No additional management support is needed unless otherwise documented below in the visit note. 

## 2016-03-12 NOTE — Progress Notes (Signed)
Dr. Frederico Hamman T. Shayli Altemose, MD, [facility] [place], [address] Phone: [phone] Fax: [phone]  03/12/2016  Patient: Brandon [name], MRN: [id number], DOB: 26-Apr-1966, 50 y.o.  Primary Physician:  Owens Loffler, MD   Chief Complaint  Patient presents with  . Knee Pain    Left-started on Monday-Been climbing alot of latters   Subjective:   Brandon [name] is a 49 y.o. very pleasant male patient who presents with the following:  Pleasant gentleman who weighs 3 or 35 pounds with a BMI of 43 who has been working on ladders a lot over the last few weeks, particularly over the last 2 months and he has had some knee pain intermittently over the last 2 months. His knee pain got quite a bit worse on the left side starting 3-4 days ago got dramatically worse. He has tried some ice and heat, as well as over-the-counter Tylenol. He has no known history of prior significant knee injuries or trauma.  Going up and down ladders - on and off for 2 months. Usually would end, but Monday.  Body mass index is 42.93 kg/(m^2).   Past Medical History, Surgical History, Social History, Family History, Problem List, Medications, and Allergies have been reviewed and updated if relevant.  Patient Active Problem List   Diagnosis Date Noted  . Ventricular ectopy 12/14/2015  . Palpitations 12/14/2015  . Cervical disc disorder with radiculopathy of cervical region 10/05/2013  . Carpal tunnel syndrome 10/05/2013  . Essential hypertension 06/16/2013  . 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 Basal cell carcinoma 06/25/2011  . Erectile dysfunction 06/09/2011  . Morbid obesity with BMI of 40.0-44.9, adult ([facility]) 06/07/2009  . DEPRESSION 06/07/2009  . ATRIAL FIBRILLATION, PAROXYSMAL 06/07/2009  . DIVERTICULITIS, COLON 06/07/2009  . ARTHRITIS 06/07/2009    Past Medical History  Diagnosis Date  . DIVERTICULITIS, COLON 06/07/2009  . DEPRESSION  06/07/2009  . CHICKENPOX, HX OF 06/07/2009  . ARTHRITIS 06/07/2009  . ATRIAL FIBRILLATION, PAROXYSMAL 06/07/2009  . Hematuria   . 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 Cervical disc disorder with radiculopathy of cervical region 10/05/2013  . Atrial fibrillation [facility])     Past Surgical History  Procedure Laterality Date  . Cardiac catheterization    . Tonsillectomy    . Foot surgery      left foot    Social History   Social History  . Marital Status: Single    Spouse Name: N/A  . Number of Children: N/A  . Years of Education: N/A   Occupational History  . self employed    Social History Main Topics  . Smoking status: Never Smoker   . Smokeless tobacco: Current User    Types: Snuff    Last Attempt to Quit: 06/17/2011     Comment: occasionally  . Alcohol Use: 0.0 oz/week    0 Standard drinks or equivalent per week     Comment: occ  . Drug Use: No  . Sexual Activity: Not on file   Other Topics Concern  . Not on file   Social History Narrative   Does not do regular exercise. Former dip - quit about 1 year ago.     Family History  Problem Relation Age of Onset  . Arthritis    . Diabetes      1st degree relative  . Stroke      F 1st degree relative <60; M 1st degree relative <50  . Stroke Father  Allergies  Allergen Reactions  . Biaxin [Clarithromycin]     Pt has history of arrhythmias  . Penicillins     Medication list reviewed and updated in full in [place].  GEN: No fevers, chills. Nontoxic. Primarily MSK c/o today. MSK: Detailed in the HPI GI: tolerating PO intake without difficulty Neuro: No numbness, parasthesias, or tingling associated. Otherwise the pertinent positives of the ROS are noted above.   Objective:   BP 120/80 mmHg  Pulse 70  Temp(Src) 98.2 F (36.8 C) (Oral)  Ht 6\' 2"  (1.88 m)  Wt 334 lb 8 oz (151.728 kg)  BMI 42.93 kg/m2   GEN: WDWN, NAD, Non-toxic, Alert & Oriented x 3 HEENT: Atraumatic, Normocephalic.  Ears and Nose:  No external deformity. EXTR: No clubbing/cyanosis/edema NEURO: Normal gait. antalgia PSYCH: Normally interactive. Conversant. Not depressed or anxious appearing.  Calm demeanor.   Knee:  L Gait: Normal heel toe pattern, antalgia ROM: 0-110 Effusion: mild Echymosis or edema: none Patellar tendon NT Painful PLICA: neg Patellar grind: negative Medial and lateral patellar facet loading: negative medial and lateral joint lines: mild posteomedial Mcmurray's + Flexion-pinch + Bounce home Varus and valgus stress: stable Lachman: neg Ant and Post drawer: neg Hip abduction, IR, ER: WNL Hip flexion str: 5/5 Hip abd: 5/5 Quad: 5/5 VMO atrophy:No Hamstring concentric and eccentric: 5/5   Radiology: Dg Knee Ap/lat W/sunrise Left  03/12/2016  CLINICAL DATA:  Two months of left knee pain, no report of injury. History of morbid obesity EXAM: LEFT KNEE 3 VIEWS COMPARISON:  None. FINDINGS: The bones of the knee are adequately mineralized. There is no acute fracture nor dislocation. Spurs arise from the articular margins of the patella. The joint spaces are preserved. There is no acute fracture nor dislocation. There is no effusion or chondrocalcinosis. 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 There is no acute bony abnormality. Electronically Signed   By: David  Martinique M.D.   On: 03/12/2016 12:19     Assessment and Plan:   Left knee pain - Plan: DG Knee AP/LAT W/Sunrise Left, methylPREDNISolone acetate (DEPO-MEDROL) injection 80 mg  Derangement, knee internal, left - Plan: methylPREDNISolone acetate (DEPO-MEDROL) injection 80 mg  More probable internal derangement, given exam, more likely meniscal pathology and a case where radiographically medial and lateral compartments are essentially preserved nonweightbearing films.  Posterior knee pain, could be exacerbation of popliteus and other insertional tendinopathy in the posterior of the  knee.  Knee Injection, L Patient verbally consented to procedure. Risks (including potential rare risk of infection), benefits, and alternatives explained. Sterilely prepped with Chloraprep. Ethyl cholride used for anesthesia. 8 cc Lidocaine 1% mixed with 2 mL Depo-Medrol 40 mg injected using the anteromedial approach without difficulty. No complications with procedure and tolerated well. Patient had decreased pain post-injection.   I have asked him to call me in one week to see how he is doing.  Follow-up: depends on his clinical outcome  Orders Placed This Encounter  Procedures  . DG Knee AP/LAT W/Sunrise Left    Signed,  Katerine Morua T. Kail Fraley, MD   Patient's Medications  New Prescriptions   No medications on file  Previous Medications   ASPIRIN 325 MG TABLET    Take 325 mg by mouth daily.     DILTIAZEM (TAZTIA XT) 180 MG 24 HR CAPSULE    Take 1 capsule (180 mg total) by mouth daily.   FLECAINIDE (TAMBOCOR) 100 MG TABLET    Take 1 tablet (100  mg total) by mouth 2 (two) times daily.   METOPROLOL TARTRATE (LOPRESSOR) 25 MG TABLET    Take 1 tablet (25 mg total) by mouth 2 (two) times daily as needed.   OMEPRAZOLE (PRILOSEC) 40 MG CAPSULE    Take 40 mg by mouth daily.   SUMATRIPTAN (IMITREX) 50 MG TABLET    Take 1 tablet (50 mg total) by mouth every 2 (two) hours as needed for migraine.   TRIAMCINOLONE (NASACORT) 55 MCG/ACT AERO NASAL INHALER    Place into the nose.  Modified Medications   No medications on file  Discontinued Medications   No medications on file

## 2016-03-12 NOTE — Patient Instructions (Signed)
Call me in 1 week to let me know how you are doing.

## 2016-03-13 ENCOUNTER — Ambulatory Visit: Payer: BLUE CROSS/BLUE SHIELD | Admitting: Family Medicine

## 2016-04-18 ENCOUNTER — Other Ambulatory Visit (INDEPENDENT_AMBULATORY_CARE_PROVIDER_SITE_OTHER): Payer: BLUE CROSS/BLUE SHIELD

## 2016-04-18 DIAGNOSIS — Z79899 Other long term (current) drug therapy: Secondary | ICD-10-CM

## 2016-04-18 DIAGNOSIS — Z1159 Encounter for screening for other viral diseases: Secondary | ICD-10-CM

## 2016-04-18 DIAGNOSIS — Z114 Encounter for screening for human immunodeficiency virus [HIV]: Secondary | ICD-10-CM

## 2016-04-18 DIAGNOSIS — Z125 Encounter for screening for malignant neoplasm of prostate: Secondary | ICD-10-CM

## 2016-04-18 DIAGNOSIS — Z13220 Encounter for screening for lipoid disorders: Secondary | ICD-10-CM | POA: Diagnosis not present

## 2016-04-18 LAB — LIPID PANEL
CHOLESTEROL: 140 mg/dL (ref 0–200)
HDL: 26.3 mg/dL — AB (ref >39.00)
LDL CALC: 92 mg/dL (ref 0–99)
NonHDL: 113.5
Total CHOL/HDL Ratio: 5
Triglycerides: 107 mg/dL (ref 0.0–149.0)
VLDL: 21.4 mg/dL (ref 0.0–40.0)

## 2016-04-18 LAB — HEPATIC FUNCTION PANEL
ALK PHOS: 48 U/L (ref 39–117)
ALT: 24 U/L (ref 0–53)
AST: 16 U/L (ref 0–37)
Albumin: 3.9 g/dL (ref 3.5–5.2)
BILIRUBIN DIRECT: 0.1 mg/dL (ref 0.0–0.3)
BILIRUBIN TOTAL: 0.6 mg/dL (ref 0.2–1.2)
Total Protein: 6.5 g/dL (ref 6.0–8.3)

## 2016-04-18 LAB — CBC WITH DIFFERENTIAL/PLATELET
BASOS PCT: 0.4 % (ref 0.0–3.0)
Basophils Absolute: 0 10*3/uL (ref 0.0–0.1)
EOS ABS: 0.1 10*3/uL (ref 0.0–0.7)
Eosinophils Relative: 1.9 % (ref 0.0–5.0)
HCT: 43.8 % (ref 39.0–52.0)
HEMOGLOBIN: 14.8 g/dL (ref 13.0–17.0)
LYMPHS ABS: 2 10*3/uL (ref 0.7–4.0)
Lymphocytes Relative: 28.5 % (ref 12.0–46.0)
MCHC: 33.7 g/dL (ref 30.0–36.0)
MCV: 85.7 fl (ref 78.0–100.0)
MONO ABS: 0.5 10*3/uL (ref 0.1–1.0)
Monocytes Relative: 7.5 % (ref 3.0–12.0)
NEUTROS PCT: 61.7 % (ref 43.0–77.0)
Neutro Abs: 4.3 10*3/uL (ref 1.4–7.7)
PLATELETS: 236 10*3/uL (ref 150.0–400.0)
RBC: 5.11 Mil/uL (ref 4.22–5.81)
RDW: 14.5 % (ref 11.5–15.5)
WBC: 6.9 10*3/uL (ref 4.0–10.5)

## 2016-04-18 LAB — BASIC METABOLIC PANEL
BUN: 13 mg/dL (ref 6–23)
CALCIUM: 8.8 mg/dL (ref 8.4–10.5)
CO2: 29 mEq/L (ref 19–32)
Chloride: 105 mEq/L (ref 96–112)
Creatinine, Ser: 0.82 mg/dL (ref 0.40–1.50)
GFR: 104.75 mL/min (ref >60.00)
Glucose, Bld: 86 mg/dL (ref 70–99)
POTASSIUM: 3.8 meq/L (ref 3.5–5.1)
SODIUM: 139 meq/L (ref 135–145)

## 2016-04-18 LAB — PSA: PSA: 0.72 ng/mL (ref 0.10–4.00)

## 2016-04-18 LAB — TSH: TSH: 2.68 u[IU]/mL (ref 0.35–4.50)

## 2016-04-18 NOTE — Addendum Note (Signed)
Addended by: Marchia Bond on: 04/18/2016 08:45 AM   Modules accepted: Orders

## 2016-04-19 LAB — HEPATITIS C ANTIBODY: HCV AB: NEGATIVE

## 2016-04-24 ENCOUNTER — Encounter: Payer: Self-pay | Admitting: Family Medicine

## 2016-04-24 ENCOUNTER — Ambulatory Visit (INDEPENDENT_AMBULATORY_CARE_PROVIDER_SITE_OTHER): Payer: BLUE CROSS/BLUE SHIELD | Admitting: Family Medicine

## 2016-04-24 VITALS — BP 130/80 | HR 67 | Temp 98.6°F | Ht 71.5 in | Wt 326.5 lb

## 2016-04-24 DIAGNOSIS — Z0000 Encounter for general adult medical examination without abnormal findings: Secondary | ICD-10-CM

## 2016-04-24 NOTE — Progress Notes (Signed)
Pre visit review using our clinic review tool, if applicable. No additional management support is needed unless otherwise documented below in the visit note. 

## 2016-04-24 NOTE — Progress Notes (Signed)
Dr. Frederico Hamman T. Kandas Oliveto, MD, [facility] [place], [address] Phone: [phone] Fax: [phone]  04/24/2016  Patient: Brandon [name], MRN: [id number], DOB: 01-31-1966, 50 y.o.  Primary Physician:  Owens Loffler, MD   Chief Complaint  Patient presents with  . Annual Exam   Subjective:   Brandon [name] is a 50 y.o. pleasant patient who presents with the following:  Preventative Health Maintenance Visit:  Health Maintenance Summary Reviewed and updated, unless pt declines services.  Tobacco History Reviewed. Alcohol: No concerns, no excessive use Exercise Habits: Some activity, rec at least 30 mins 5 times a week STD concerns: no risk or activity to increase risk Drug Use: None Encouraged self-testicular check  Lost 8 pounds.   BCC on base of neck.  Health Maintenance  Topic Date Due  . HIV Screening  01/08/1979  . INFLUENZA VACCINE  06/03/2016  . TETANUS/TDAP  12/04/2020  . COLONOSCOPY  05/15/2024  . Hepatitis C Screening  Completed   Immunization History  Administered Date(s) Administered  . Influenza Whole 11/15/2009, 07/14/2011  . Influenza, Seasonal, Injecte, Preservative Fre 11/08/2012  . Influenza,inj,Quad PF,36+ Mos 08/24/2013, 08/16/2014, 08/20/2015  . Td 12/04/2010   Patient Active Problem List   Diagnosis Date Noted  . Ventricular ectopy 12/14/2015  . Cervical disc disorder with radiculopathy of cervical region 10/05/2013  . Carpal tunnel syndrome 10/05/2013  . Essential hypertension 06/16/2013  . 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 Basal cell carcinoma 06/25/2011  . Erectile dysfunction 06/09/2011  . Morbid obesity with BMI of 40.0-44.9, adult ([place]) 06/07/2009  . DEPRESSION 06/07/2009  . ATRIAL FIBRILLATION, PAROXYSMAL 06/07/2009  . DIVERTICULITIS, COLON 06/07/2009  . ARTHRITIS 06/07/2009   Past Medical History  Diagnosis Date  . DIVERTICULITIS, COLON 06/07/2009  .  DEPRESSION 06/07/2009  . CHICKENPOX, HX OF 06/07/2009  . ARTHRITIS 06/07/2009  . ATRIAL FIBRILLATION, PAROXYSMAL 06/07/2009  . Hematuria   . 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 Cervical disc disorder with radiculopathy of cervical region 10/05/2013  . Atrial fibrillation [facility])    Past Surgical History  Procedure Laterality Date  . Cardiac catheterization    . Tonsillectomy    . Foot surgery      left foot   Social History   Social History  . Marital Status: Single    Spouse Name: N/A  . Number of Children: N/A  . Years of Education: N/A   Occupational History  . self employed    Social History Main Topics  . Smoking status: Never Smoker   . Smokeless tobacco: Current User    Types: Snuff    Last Attempt to Quit: 06/17/2011     Comment: occasionally  . Alcohol Use: 0.0 oz/week    0 Standard drinks or equivalent per week     Comment: occ  . Drug Use: No  . Sexual Activity: Not on file   Other Topics Concern  . Not on file   Social History Narrative   Does not do regular exercise. Former dip - quit about 1 year ago.    Family History  Problem Relation Age of Onset  . Arthritis    . Diabetes      1st degree relative  . Stroke      F 1st degree relative <60; M 1st degree relative <50  . Stroke Father    Allergies  Allergen Reactions  . Biaxin [Clarithromycin]     Pt has history of arrhythmias  .  Penicillins     Medication list has been reviewed and updated.   General: Denies fever, chills, sweats. No significant weight loss. Eyes: Denies blurring,significant itching ENT: Denies earache, sore throat, and hoarseness. Cardiovascular: Denies chest pains, palpitations, dyspnea on exertion Respiratory: Denies cough, dyspnea at rest,wheeezing Breast: no concerns about lumps GI: Denies nausea, vomiting, diarrhea, constipation, change in bowel habits, abdominal pain, melena, hematochezia GU: Denies penile discharge, ED, urinary flow / outflow problems. No STD  concerns. Musculoskeletal: Denies back pain, joint pain Derm: ? Areas of bcc vs AK at neck and on arms Neuro: Denies  paresthesias, frequent falls, frequent headaches Psych: Denies depression, anxiety Endocrine: Denies cold intolerance, heat intolerance, polydipsia Heme: Denies enlarged lymph nodes Allergy: No hayfever  Objective:   BP 130/80 mmHg  Pulse 67  Temp(Src) 98.6 F (37 C) (Oral)  Ht 5' 11.5" (1.816 m)  Wt 326 lb 8 oz (148.099 kg)  BMI 44.91 kg/m2 Ideal Body Weight: Weight in (lb) to have BMI = 25: 181.4  No exam data present  GEN: well developed, well nourished, no acute distress Eyes: conjunctiva and lids normal, PERRLA, EOMI ENT: TM clear, nares clear, oral exam WNL Neck: supple, no lymphadenopathy, no thyromegaly, no JVD Pulm: clear to auscultation and percussion, respiratory effort normal CV: regular rate and rhythm, S1-S2, no murmur, rub or gallop, no bruits, peripheral pulses normal and symmetric, no cyanosis, clubbing, edema or varicosities GI: soft, non-tender; no hepatosplenomegaly, masses; active bowel sounds all quadrants GU: no hernia, testicular mass, penile discharge Lymph: no cervical, axillary or inguinal adenopathy MSK: gait normal, muscle tone and strength WNL, no joint swelling, effusions, discoloration, crepitus  SKIN: BASE OF NECK WITH >1 CM LESION ELEVATED AND PEARLY IN APPEARANCE Neuro: normal mental status, normal strength, sensation, and motion Psych: alert; oriented to person, place and time, normally interactive and not anxious or depressed in appearance.  All labs reviewed with patient.  Lipids:    Component Value Date/Time   CHOL 140 04/18/2016 0807   TRIG 107.0 04/18/2016 0807   HDL 26.30* 04/18/2016 0807   VLDL 21.4 04/18/2016 0807   CHOLHDL 5 04/18/2016 0807   CBC: CBC Latest Ref Rng 04/18/2016 03/21/2015 03/13/2014  WBC 4.0 - 10.5 K/uL 6.9 6.7 6.7  Hemoglobin 13.0 - 17.0 g/dL 14.8 15.1 14.7  Hematocrit 39.0 - 52.0 % 43.8 44.5  43.7  Platelets 150.0 - 400.0 K/uL 236.0 236.0 932.6    Basic Metabolic Panel:    Component Value Date/Time   NA 139 04/18/2016 0807   K 3.8 04/18/2016 0807   CL 105 04/18/2016 0807   CO2 29 04/18/2016 0807   BUN 13 04/18/2016 0807   CREATININE 0.82 04/18/2016 0807   GLUCOSE 86 04/18/2016 0807   CALCIUM 8.8 04/18/2016 0807   Hepatic Function Latest Ref Rng 04/18/2016 03/21/2015 07/03/2014  Total Protein 6.0 - 8.3 g/dL 6.5 6.7 7.8  Albumin 3.5 - 5.2 g/dL 3.9 3.9 3.9  AST 0 - 37 U/L _0 ALT 0 - 53 U/L 24 25 34  Alk Phosphatase 39 - 117 U/L 48 48 42  Total Bilirubin 0.2 - 1.2 mg/dL 0.6 0.5 0.9  Bilirubin, Direct 0.0 - 0.3 mg/dL 0.1 0.1 0.1    Lab Results  Component Value Date   TSH 2.68 04/18/2016   Lab Results  Component Value Date   PSA 0.72 04/18/2016   PSA 0.59 03/21/2015   PSA 0.58 03/13/2014    Assessment and Plan:   Routine general medical examination at  a health care facility  F/u with probable BCC on base of neck - he will call his dermatologist tomorrow.  Health Maintenance Exam: The patient's preventative maintenance and recommended screening tests for an annual wellness exam were reviewed in full today. Brought up to date unless services declined.  Counselled on the importance of diet, exercise, and its role in overall health and mortality. The patient's FH and SH was reviewed, including their home life, tobacco status, and drug and alcohol status.  Follow-up: No Follow-up on file. Unless noted, follow-up in 1 year for Health Maintenance Exam.  Signed,  Frederico Hamman T. Auden Wettstein, MD   Patient's Medications  New Prescriptions   No medications on file  Previous Medications   ASPIRIN 325 MG TABLET    Take 325 mg by mouth daily.     DILTIAZEM (TAZTIA XT) 180 MG 24 HR CAPSULE    Take 1 capsule (180 mg total) by mouth daily.   FLECAINIDE (TAMBOCOR) 100 MG TABLET    Take 1 tablet (100 mg total) by mouth 2 (two) times daily.   METOPROLOL TARTRATE  (LOPRESSOR) 25 MG TABLET    Take 1 tablet (25 mg total) by mouth 2 (two) times daily as needed.   OMEPRAZOLE (PRILOSEC) 40 MG CAPSULE    Take 40 mg by mouth daily.   TRIAMCINOLONE (NASACORT) 55 MCG/ACT AERO NASAL INHALER    Place into the nose.  Modified Medications   No medications on file  Discontinued Medications   SUMATRIPTAN (IMITREX) 50 MG TABLET    Take 1 tablet (50 mg total) by mouth every 2 (two) hours as needed for migraine.

## 2016-04-29 ENCOUNTER — Other Ambulatory Visit: Payer: Self-pay | Admitting: *Deleted

## 2016-04-29 MED ORDER — DILTIAZEM HCL ER BEADS 180 MG PO CP24: 180.0000 mg | ORAL_CAPSULE | Freq: Every day | ORAL | Status: DC

## 2016-04-29 NOTE — Telephone Encounter (Signed)
Requested Prescriptions   Signed Prescriptions Disp Refills  . diltiazem (TAZTIA XT) 180 MG 24 hr capsule 90 capsule 3    Sig: Take 1 capsule (180 mg total) by mouth daily.    Authorizing Provider: Minna Merritts    Ordering User: Britt Bottom

## 2016-05-12 ENCOUNTER — Ambulatory Visit (INDEPENDENT_AMBULATORY_CARE_PROVIDER_SITE_OTHER): Payer: BLUE CROSS/BLUE SHIELD | Admitting: Family Medicine

## 2016-05-12 ENCOUNTER — Encounter: Payer: Self-pay | Admitting: Family Medicine

## 2016-05-12 VITALS — BP 140/90 | HR 74 | Temp 98.5°F | Wt 328.5 lb

## 2016-05-12 DIAGNOSIS — R0789 Other chest pain: Secondary | ICD-10-CM | POA: Diagnosis not present

## 2016-05-12 DIAGNOSIS — K219 Gastro-esophageal reflux disease without esophagitis: Secondary | ICD-10-CM

## 2016-05-12 NOTE — Progress Notes (Signed)
Pre visit review using our clinic review tool, if applicable. No additional management support is needed unless otherwise documented below in the visit note.  Not pain but a "sensation" on the B upper chest "on the chest, not deep in".  Noted more with some positional changes/movement.  Worse if prolonged sitting.  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    H/o AF but no h/o CAD/MI.  Still feels like he is in rhythm.  No racing, no SOB.    Does a lot of driving.  This AM felt hot and nauseated while driving.  He felt better after getting up and walking some.  Felt fine coming home and since.  No syncope.  No presyncope.  That episode wasn't typical for patient.  No new foods for breakfast.   He'll need to burp frequently, with some transient relief of bloating sensation.  That is chronic.  Still on prilosec with heartburn controlled.   No FCNAVD.  No blood in stool..  No rash except for possible poison ivy on the R arm, recently noted.    Meds, vitals, and allergies reviewed.   ROS: Per HPI unless specifically indicated in ROS section   GEN: nad, alert and oriented HEENT: mucous membranes moist NECK: supple w/o LA CV: rrr.  Chest wall ttp, sx reproduced with him raising the arms above his head PULM: ctab, no inc wob ABD: soft, +bs EXT: no edema SKIN: no acute rash

## 2016-05-12 NOTE — Patient Instructions (Addendum)
Take the prilosec daily for a few days and then update [place] as needed.   Bland diet in the meantime.   The chest wall pain should gradually get better.   Take care.  Glad to see you.

## 2016-05-13 ENCOUNTER — Encounter: Payer: Self-pay | Admitting: Family Medicine

## 2016-05-13 DIAGNOSIS — R0789 Other chest pain: Secondary | ICD-10-CM | POA: Insufficient documentation

## 2016-05-13 DIAGNOSIS — K219 Gastro-esophageal reflux disease without esophagitis: Secondary | ICD-10-CM | POA: Insufficient documentation

## 2016-05-13 NOTE — Assessment & Plan Note (Signed)
With lifting and straining at work, and running a tiller.  Reproduced.  Neg prev cardiac cath per patient.  Likely from [facility] source, not from intrathoracic source, d/w pt.  Activity okay as discomfort allows.

## 2016-05-13 NOTE — Assessment & Plan Note (Signed)
He'll need to burp frequently, with some transient relief of bloating sensation.  That is chronic.  Still on prilosec with heartburn controlled but if he stops a few days, then he'll note acidic taste in mouth.  Only taking PPI every other day at baseline.  Had baseline greasy breakfast this AM, before sx in the care.  dw pt about bland diet and taking PPI daily, f/u with PCP o/w.  Well appearing now.  Okay for outpatient f/u.  ddx d/w pt.

## 2016-06-12 ENCOUNTER — Encounter: Payer: Self-pay | Admitting: Family Medicine

## 2016-06-16 ENCOUNTER — Other Ambulatory Visit: Payer: Self-pay | Admitting: Cardiovascular Disease

## 2016-06-23 ENCOUNTER — Encounter: Payer: Self-pay | Admitting: Family Medicine

## 2016-06-23 ENCOUNTER — Ambulatory Visit (INDEPENDENT_AMBULATORY_CARE_PROVIDER_SITE_OTHER): Payer: BLUE CROSS/BLUE SHIELD | Admitting: Family Medicine

## 2016-06-23 VITALS — BP 120/76 | HR 84 | Temp 98.5°F | Ht 71.5 in | Wt 325.2 lb

## 2016-06-23 DIAGNOSIS — M5010 Cervical disc disorder with radiculopathy, unspecified cervical region: Secondary | ICD-10-CM

## 2016-06-23 DIAGNOSIS — Z23 Encounter for immunization: Secondary | ICD-10-CM

## 2016-06-23 MED ORDER — GABAPENTIN 300 MG PO CAPS
300.0000 mg | ORAL_CAPSULE | Freq: Three times a day (TID) | ORAL | 3 refills | Status: DC
Start: 2016-06-23 — End: 2016-07-16

## 2016-06-23 NOTE — Progress Notes (Signed)
Pre visit review using our clinic review tool, if applicable. No additional management support is needed unless otherwise documented below in the visit note. 

## 2016-06-23 NOTE — Progress Notes (Signed)
Dr. Frederico Hamman T. Hazelyn Kallen, MD, [facility] [place], [address] Phone: [phone] Fax: [phone]  06/23/2016  Patient: Brandon [name], MRN: [id number], DOB: Jun 07, 1966, 50 y.o.  Primary Physician:  Owens Loffler, MD   Chief Complaint  Patient presents with  . Back Pain  . Facial Swelling   Subjective:   Brandon [name] is a 50 y.o. very pleasant male patient who presents with the following:  L sided facial swelling: Ongoing L sided facial swelling Subjectively, he feels like his left-sided his face is swollen somewhat.  Cervical spine c6,7,8.  Ongoing pain. Hurts. Bothers him more to sit than to drive With resting and seeing family it hurts.  Everything will get tight.  This is the same ongoing problem and he is been having for multiple years.  In the past he is asked some injections from Dr. Jeanell Sparrow most.  These improve his symptoms for about a year at a time.  Has been taking tylenol  Past Medical History, Surgical History, Social History, Family History, Problem List, Medications, and Allergies have been reviewed and updated if relevant.  Patient Active Problem List   Diagnosis Date Noted  . Chest wall pain 05/13/2016  . GERD (gastroesophageal reflux disease) 05/13/2016  . Ventricular ectopy 12/14/2015  . Cervical disc disorder with radiculopathy of cervical region 10/05/2013  . Carpal tunnel syndrome 10/05/2013  . Essential hypertension 06/16/2013  . 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 Basal cell carcinoma 06/25/2011  . Erectile dysfunction 06/09/2011  . Morbid obesity with BMI of 40.0-44.9, adult ([place]) 06/07/2009  . DEPRESSION 06/07/2009  . ATRIAL FIBRILLATION, PAROXYSMAL 06/07/2009  . DIVERTICULITIS, COLON 06/07/2009  . ARTHRITIS 06/07/2009    Past Medical History:  Diagnosis Date  . ARTHRITIS 06/07/2009  . Atrial fibrillation ([place])   . ATRIAL FIBRILLATION, PAROXYSMAL 06/07/2009  . Cervical  disc disorder with radiculopathy of cervical region 10/05/2013  . CHICKENPOX, HX OF 06/07/2009  . 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 DEPRESSION 06/07/2009  . DIVERTICULITIS, COLON 06/07/2009  . Hematuria     Past Surgical History:  Procedure Laterality Date  . CARDIAC CATHETERIZATION    . FOOT SURGERY     left foot  . TONSILLECTOMY      Social History   Social History  . Marital status: Single    Spouse name: N/A  . Number of children: N/A  . Years of education: N/A   Occupational History  . self employed [employer]   Social History Main Topics  . Smoking status: Never Smoker  . Smokeless tobacco: Current User    Types: Snuff    Last attempt to quit: 06/17/2011     Comment: occasionally  . Alcohol use 0.0 oz/week     Comment: occ  . Drug use: No  . Sexual activity: Not on file   Other Topics Concern  . Not on file   Social History Narrative   Does not do regular exercise. Former dip - quit about 1 year ago.     Family History  Problem Relation Age of Onset  . Arthritis    . Diabetes      1st degree relative  . Stroke      F 1st degree relative <60; M 1st degree relative <50  . Stroke Father     Allergies  Allergen Reactions  . Biaxin [Clarithromycin]     Pt has history of arrhythmias  . Penicillins     Medication list  reviewed and updated in full in [facility].  GEN: No fevers, chills. Nontoxic. Primarily MSK c/o today. MSK: Detailed in the HPI GI: tolerating PO intake without difficulty Neuro: No numbness, parasthesias, or tingling associated. Otherwise the pertinent positives of the ROS are noted above.   Objective:   BP 120/76   Pulse 84   Temp 98.5 F (36.9 C) (Oral)   Ht 5' 11.5" (1.816 m)   Wt (!) 325 lb 4 oz (147.5 kg)   BMI 44.73 kg/m    GEN: Well-developed,well-nourished,in no acute distress; alert,appropriate and cooperative throughout examination HEENT: Normocephalic and atraumatic without obvious abnormalities. Ears, externally  no deformities PULM: Breathing comfortably in no respiratory distress EXT: No clubbing, cyanosis, or edema PSYCH: Normally interactive. Cooperative during the interview. Pleasant. Friendly and conversant. Not anxious or depressed appearing. Normal, full affect.  CERVICAL SPINE EXAM Range of motion: Flexion, extension, lateral bending, and rotation: approx loss of 30% motion Pain with terminal motion: yes Spinous Processes: NT SCM: NT Upper paracervical muscles: ttp, mild Upper traps: ttp mild C5-T1 intact, sensation and motor   Radiology: No results found.  Assessment and Plan:   Cervical disc disorder with radiculopathy of cervical region  Need for prophylactic vaccination and inoculation against influenza - Plan: Flu Vaccine QUAD 36+ mos PF IM (Fluarix & Fluzone Quad PF)   Honestly do not appreciate any swelling or fullness on the left side of his face.  Theoretically could have a blocked salivary gland.  Doubtful this is of much clinical significance.  Challenging situation with his neck.  He did improve some with physical therapy in the past.  We have not done any nerve pain agents, so we will try to do a titration upwards of gabapentin to see if this helps his symptoms.  Follow-up: 6-8 weeks  New Prescriptions   GABAPENTIN (NEURONTIN) 300 MG CAPSULE    Take 1 capsule (300 mg total) by mouth 3 (three) times daily.   Orders Placed This Encounter  Procedures  . Flu Vaccine QUAD 36+ mos PF IM (Fluarix & Fluzone Quad PF)    Signed,  Aengus Sauceda T. Aldona Bryner, MD   Patient's Medications  New Prescriptions   GABAPENTIN (NEURONTIN) 300 MG CAPSULE    Take 1 capsule (300 mg total) by mouth 3 (three) times daily.  Previous Medications   ASPIRIN 325 MG TABLET    Take 325 mg by mouth daily.     DILTIAZEM (TAZTIA XT) 180 MG 24 HR CAPSULE    Take 1 capsule (180 mg total) by mouth daily.   FLECAINIDE (TAMBOCOR) 100 MG TABLET    TAKE ONE TABLET BY MOUTH TWICE DAILY   METOPROLOL TARTRATE  (LOPRESSOR) 25 MG TABLET    Take 1 tablet (25 mg total) by mouth 2 (two) times daily as needed.   OMEPRAZOLE (PRILOSEC) 40 MG CAPSULE    Take 40 mg by mouth daily.   TRIAMCINOLONE (NASACORT) 55 MCG/ACT AERO NASAL INHALER    Place into the nose.  Modified Medications   No medications on file  Discontinued Medications   No medications on file

## 2016-06-23 NOTE — Patient Instructions (Addendum)
Self-traction technique (through [employer] or Youtube)   Generic Gabapentin Titration Schedule  Generic Gabapentin (generic form of Neurontin) comes in 300 mg tablets or capsules.   You have to titrate your dose slowly to reduce side effects and reduce sedation / sleepiness.    Week               Breakfast  Lunch   Dinner One                 0   0   300 mg Two   300mg    0   300mg  Three  300mg    300mg    300mg  Four   300mg    300mg    600mg  (2 tabs) Five   600mg  (2 tabs) 300mg    600mg  (2 tabs) Six   600mg  (2 tabs) 600mg  (2 tabs) 600mg  (2 tabs) Seven  600mg  (2 tabs) 600mg  (2 tabs) 900mg  (3 tabs) Eight   900mg  (3 tabs) 600mg  (2 tabs) 900mg  (3 tabs)  If you have any problems at any time, drop back to the previous dosing schedule. Continue with this dose for 1 week, and then try to go up the next step again.

## 2016-07-16 ENCOUNTER — Other Ambulatory Visit: Payer: Self-pay

## 2016-07-16 MED ORDER — GABAPENTIN 300 MG PO CAPS: ORAL_CAPSULE | ORAL | 1 refills | Status: DC

## 2016-07-16 NOTE — Telephone Encounter (Signed)
Pt request new rx for gabapentin to [facility] since titrating up. Spoke with pharmacist and he suggested instructions on rx to be titrate as directed # 180 x 1 since pt has the titration chart. Dr Lorelei Pont said that would be OK to do. Done and pt voiced understanding. FYI to Dr Lorelei Pont,

## 2016-08-04 ENCOUNTER — Emergency Department
Admission: EM | Admit: 2016-08-04 | Discharge: 2016-08-05 | Disposition: A | Discharge status: Home / Self Care | Payer: BLUE CROSS/BLUE SHIELD | Attending: Emergency Medicine | Admitting: Emergency Medicine

## 2016-08-04 ENCOUNTER — Emergency Department: Payer: BLUE CROSS/BLUE SHIELD

## 2016-08-04 ENCOUNTER — Encounter: Payer: Self-pay | Admitting: Emergency Medicine

## 2016-08-04 DIAGNOSIS — S32038A Other fracture of third lumbar vertebra, initial encounter for closed fracture: Secondary | ICD-10-CM | POA: Diagnosis not present

## 2016-08-04 DIAGNOSIS — W01198A Fall on same level from slipping, tripping and stumbling with subsequent striking against other object, initial encounter: Secondary | ICD-10-CM | POA: Insufficient documentation

## 2016-08-04 DIAGNOSIS — Y999 Unspecified external cause status: Secondary | ICD-10-CM | POA: Diagnosis not present

## 2016-08-04 DIAGNOSIS — I10 Essential (primary) hypertension: Secondary | ICD-10-CM | POA: Insufficient documentation

## 2016-08-04 DIAGNOSIS — Y929 Unspecified place or not applicable: Secondary | ICD-10-CM | POA: Diagnosis not present

## 2016-08-04 DIAGNOSIS — S32018A Other fracture of first lumbar vertebra, initial encounter for closed fracture: Secondary | ICD-10-CM | POA: Diagnosis not present

## 2016-08-04 DIAGNOSIS — Y939 Activity, unspecified: Secondary | ICD-10-CM | POA: Insufficient documentation

## 2016-08-04 DIAGNOSIS — Z79899 Other long term (current) drug therapy: Secondary | ICD-10-CM | POA: Insufficient documentation

## 2016-08-04 DIAGNOSIS — Z7982 Long term (current) use of aspirin: Secondary | ICD-10-CM | POA: Insufficient documentation

## 2016-08-04 DIAGNOSIS — S3992XA Unspecified injury of lower back, initial encounter: Secondary | ICD-10-CM | POA: Diagnosis present

## 2016-08-04 DIAGNOSIS — S40012A Contusion of left shoulder, initial encounter: Secondary | ICD-10-CM

## 2016-08-04 DIAGNOSIS — W19XXXA Unspecified fall, initial encounter: Secondary | ICD-10-CM

## 2016-08-04 DIAGNOSIS — S32028A Other fracture of second lumbar vertebra, initial encounter for closed fracture: Secondary | ICD-10-CM | POA: Diagnosis not present

## 2016-08-04 DIAGNOSIS — F17290 Nicotine dependence, other tobacco product, uncomplicated: Secondary | ICD-10-CM | POA: Diagnosis not present

## 2016-08-04 MED ORDER — SODIUM CHLORIDE 0.9 % IV BOLUS (SEPSIS)
1000.0000 mL | Freq: Once | INTRAVENOUS | Status: AC
  Administered 2016-08-04: 1000 mL via INTRAVENOUS

## 2016-08-04 MED ORDER — DIAZEPAM 5 MG PO TABS: 5.0000 mg | ORAL_TABLET | Freq: Four times a day (QID) | ORAL | 0 refills | Status: DC | PRN

## 2016-08-04 MED ORDER — DIAZEPAM 5 MG PO TABS
5.0000 mg | ORAL_TABLET | Freq: Once | ORAL | Status: AC
  Administered 2016-08-04: 5 mg via ORAL
  Filled 2016-08-04: qty 1

## 2016-08-04 MED ORDER — SODIUM CHLORIDE 0.9 % IV BOLUS (SEPSIS): 1000.0000 mL | Freq: Once | INTRAVENOUS | Status: DC

## 2016-08-04 MED ORDER — HYDROMORPHONE HCL 1 MG/ML IJ SOLN
INTRAMUSCULAR | Status: AC
  Administered 2016-08-04: 1 mg via INTRAVENOUS
  Filled 2016-08-04: qty 1

## 2016-08-04 MED ORDER — OXYCODONE-ACETAMINOPHEN 5-325 MG PO TABS: 1.0000 | ORAL_TABLET | ORAL | 0 refills | Status: DC | PRN

## 2016-08-04 MED ORDER — DIAZEPAM 5 MG PO TABS
ORAL_TABLET | ORAL | Status: AC
  Filled 2016-08-04: qty 1

## 2016-08-04 MED ORDER — IBUPROFEN 800 MG PO TABS: 800.0000 mg | ORAL_TABLET | Freq: Three times a day (TID) | ORAL | 0 refills | Status: DC | PRN

## 2016-08-04 MED ORDER — FENTANYL CITRATE (PF) 100 MCG/2ML IJ SOLN
100.0000 ug | Freq: Once | INTRAMUSCULAR | Status: AC
  Administered 2016-08-04: 100 ug via INTRAVENOUS

## 2016-08-04 MED ORDER — HYDROMORPHONE HCL 1 MG/ML IJ SOLN
1.0000 mg | Freq: Once | INTRAMUSCULAR | Status: AC
  Administered 2016-08-04: 1 mg via INTRAVENOUS

## 2016-08-04 MED ORDER — DIAZEPAM 5 MG PO TABS
5.0000 mg | ORAL_TABLET | Freq: Once | ORAL | Status: AC
  Administered 2016-08-05: 5 mg via ORAL
  Filled 2016-08-04: qty 1

## 2016-08-04 MED ORDER — FENTANYL CITRATE (PF) 100 MCG/2ML IJ SOLN
INTRAMUSCULAR | Status: AC
  Administered 2016-08-04: 100 ug via INTRAVENOUS
  Filled 2016-08-04: qty 2

## 2016-08-04 NOTE — ED Provider Notes (Signed)
[facility] Emergency Department Provider Note  ____________________________________________  Time seen: Approximately 7:57 PM  I have reviewed the triage vital signs and the nursing notes.   HISTORY  Chief Complaint Back Pain    HPI Brandon [name] is a 50 y.o. male with obesity, arthritis, cervical radiculopathy history presenting with low back pain after fall. The patient states that he fell off of a combine onto a metal disc directly striking his mid and lower lumbar spine. After while he was able to get up and ambulate. He denies any loss of consciousness, headache, nausea or vomiting. He does not have any numbness, tingling, weakness, difficulty walking, urinary or fecal incontinence or retention. He did not have any associated chest pain, shortness of breath, palpitations, lightheadedness or syncope.   Past Medical History:  Diagnosis Date  . ARTHRITIS 06/07/2009  . Atrial fibrillation ([place])   . ATRIAL FIBRILLATION, PAROXYSMAL 06/07/2009  . Cervical disc disorder with radiculopathy of cervical region 10/05/2013  . CHICKENPOX, HX OF 06/07/2009  . 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 DEPRESSION 06/07/2009  . DIVERTICULITIS, COLON 06/07/2009  . Hematuria     Patient Active Problem List   Diagnosis Date Noted  . Chest wall pain 05/13/2016  . GERD (gastroesophageal reflux disease) 05/13/2016  . Ventricular ectopy 12/14/2015  . Cervical disc disorder with radiculopathy of cervical region 10/05/2013  . Carpal tunnel syndrome 10/05/2013  . Essential hypertension 06/16/2013  . 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 Basal cell carcinoma 06/25/2011  . Erectile dysfunction 06/09/2011  . Morbid obesity with BMI of 40.0-44.9, adult ([place]) 06/07/2009  . DEPRESSION 06/07/2009  . ATRIAL FIBRILLATION, PAROXYSMAL 06/07/2009  . DIVERTICULITIS, COLON 06/07/2009  . ARTHRITIS 06/07/2009    Past Surgical History:  Procedure Laterality Date  . CARDIAC CATHETERIZATION    . FOOT  SURGERY     left foot  . TONSILLECTOMY      Current Outpatient Rx  . Order #: [id number] Class: Historical Med  . Order #: [id number] Class: Print  . Order #: [id number] Class: Normal  . Order #: [id number] Class: Normal  . Order #: [id number] Class: Normal  . Order #: [id number] Class: Print  . Order #: [id number] Class: Normal  . Order #: [id number] Class: Historical Med  . Order #: [id number] Class: Print  . Order #: [id number] Class: Historical Med    Allergies Biaxin [clarithromycin] and Penicillins  Family History  Problem Relation Age of Onset  . Stroke Father   . Arthritis    . Diabetes      1st degree relative  . Stroke      F 1st degree relative <60; M 1st degree relative <50    Social History Social History  Substance Use Topics  . Smoking status: Never Smoker  . Smokeless tobacco: Current User    Types: Snuff    Last attempt to quit: 06/17/2011     Comment: occasionally  . Alcohol use 0.0 oz/week     Comment: occ    Review of Systems Constitutional: No fever/chills.No lightheadedness or [place] to be. Eyes: No visual changes. No blurred or double vision. ENT: No sore throat. No congestion or rhinorrhea. Cardiovascular: Denies chest pain. Denies palpitations. Respiratory: Denies shortness of breath.  No cough. Gastrointestinal: No abdominal pain.  No nausea, no vomiting.  No diarrhea.  No constipation. Genitourinary: Negative for dysuria. Musculoskeletal: Positive for mid and lower lumbar spine pain. No neck or upper back pain. Skin: Negative for rash. Neurological: Negative for headaches. No focal numbness, tingling or weakness. Difficult walking.  10-point ROS  otherwise negative.  ____________________________________________   PHYSICAL EXAM:  VITAL SIGNS: ED Triage Vitals  Enc Vitals Group     BP 08/04/16 1950 (!) 158/74     Pulse Rate 08/04/16 1950 84     Resp 08/04/16 1950 16     Temp 08/04/16 1950 98 F (36.7 C)     Temp Source 08/04/16 1950 Oral     SpO2  08/04/16 1950 96 %     Weight 08/04/16 1951 (!) 325 lb (147.4 kg)     Height 08/04/16 1951 6' (1.829 m)     Head Circumference --      Peak Flow --      Pain Score 08/04/16 1955 10     Pain Loc --      Pain Edu? --      Excl. in [place]? --     Constitutional: Alert and oriented. Patient is uncomfortable appearing but nontoxic. He is able to answer questions appropriately. Eyes: Conjunctivae are normal.  EOMI. No scleral icterus. Head: Atraumatic. No raccoon eyes or Battle sign. Nose: No congestion/rhinnorhea. Mouth/Throat: Mucous membranes are moist. No dental injury or malocclusion. Neck: No stridor.  Supple.  No midline C-spine tenderness to palpation, step-offs or deformities. I have placed a [place] collar on the patient immediately. Cardiovascular: Normal rate, regular rhythm. No murmurs, rubs or gallops.  Respiratory: Normal respiratory effort.  No accessory muscle use or retractions. Lungs CTAB.  No wheezes, rales or ronchi. Gastrointestinal: Obese. Soft, nontender and nondistended.  No guarding or rebound.  No peritoneal signs. Musculoskeletal: No LE edema. Reddish discoloration consistent with early bruising over the left scapula. The patient does not have any thoracic spine tenderness, step-offs or deformities. The patient has tenderness to palpation in the mid lumbar and lower lumbar spine without step-offs or deformities, nor any overlying ecchymosis, swelling, or skin break. Neurologic:  A&Ox3.  Speech is clear.  Face and smile are symmetric.  EOMI.  5 out of 5 grip strength, biceps, triceps, hip flexors, plantar flexion and dorsiflexion. Normal sensation to light touch throughout the upper and lower extremities bilaterally. Skin:  Skin is warm, dry and intact. No rash noted. Psychiatric: Mood and affect are normal. Speech and behavior are normal.  Normal judgement.  ____________________________________________   LABS (all labs ordered are listed, but only abnormal results  are displayed)  Labs Reviewed - No data to display ____________________________________________  EKG  Not indicated ____________________________________________  RADIOLOGY  Dg Chest 1 View  Result Date: 08/04/2016 CLINICAL DATA:  Status post fall today with a blow to the back. Pain. EXAM: CHEST 1 VIEW COMPARISON:  Single-view of the chest 03/23/2011. FINDINGS: The lungs are clear. Heart size is normal. No pneumothorax or pleural effusion. No fracture is identified. IMPRESSION: Negative chest. Electronically Signed   By: Inge Rise M.D.   On: 08/04/2016 21:14   Dg Thoracic Spine 2 View  Result Date: 08/04/2016 CLINICAL DATA:  Status post fall today with a blow to the back. Thoracic spine pain. Initial encounter. EXAM: THORACIC SPINE 2 VIEWS COMPARISON:  None. FINDINGS: There is no evidence of thoracic spine fracture. Alignment is normal. No other significant bone abnormalities are identified. IMPRESSION: Negative exam. Electronically Signed   By: Inge Rise M.D.   On: 08/04/2016 21:12   Ct Cervical Spine Wo Contrast  Result Date: 08/04/2016 CLINICAL DATA:  50 year old male with fall and neck pain. EXAM: CT CERVICAL SPINE WITHOUT CONTRAST TECHNIQUE: Multidetector CT imaging of the cervical spine was performed without intravenous  contrast. Multiplanar CT image reconstructions were also generated. COMPARISON:  C-spine MRI dated 04/24/2013 FINDINGS: Alignment: Normal. Skull base and vertebrae: No acute fracture. No primary bone lesion or focal pathologic process. Soft tissues and spinal canal: No prevertebral fluid or swelling. No visible canal hematoma. Upper chest: Negative. Other: A 1 x 1 cm focal heterogeneity in versus nodule in the left thyroid lobe. Ultrasound may provide better evaluation. IMPRESSION: No acute/ traumatic cervical spine pathology. Electronically Signed   By: Anner Crete M.D.   On: 08/04/2016 20:54   Ct Lumbar Spine Wo Contrast  Result Date:  08/04/2016 CLINICAL DATA:  Status post slip and fall today with a low back injury. Pain. Initial encounter. EXAM: CT LUMBAR SPINE WITHOUT CONTRAST TECHNIQUE: Multidetector CT imaging of the lumbar spine was performed without intravenous contrast administration. Multiplanar CT image reconstructions were also generated. COMPARISON:  None. FINDINGS: Segmentation: Unremarkable. Alignment: 0.5 cm anterolisthesis L4 on L5 is due to facet arthropathy. Vertebrae: There are acute left transverse process fractures of L1 and L2. There may be a right transverse process fracture L3. No other fracture is identified. 0.4 cm in diameter sclerotic lesion in the L3 vertebral body is compatible with a bone island and unchanged since the prior CT. Paraspinal and other soft tissues: Left adrenal adenoma is unchanged. Disc levels: T11-12 is imaged in the sagittal plane only and negative. T12-L1: Facet arthropathy is worse on the left. This level is otherwise negative. L1-2:  Facet degenerative change.  Otherwise negative. L2-3: Minimal disc bulge without central canal or foraminal stenosis. L3-4: Minimal disc bulge and mild facet degenerative change without central canal or foraminal stenosis. L4-5: Advanced bilateral facet degenerative change. The disc is uncovered without bulging. The central canal and foramina are open. L5-S1: Negative. IMPRESSION: Acute left transverse process fractures L1 and L2. There may be a nondisplaced right transverse process fracture of L3. No other acute abnormality is identified. Negative for central canal stenosis. Facet arthropathy at L4-5 results in 0.5 cm anterolisthesis. Electronically Signed   By: Inge Rise M.D.   On: 08/04/2016 20:54    ____________________________________________   PROCEDURES  Procedure(s) performed: None  Procedures  Critical Care performed: No ____________________________________________   INITIAL IMPRESSION / ASSESSMENT AND PLAN / ED COURSE  Pertinent  labs & imaging results that were available during my care of the patient were reviewed by me and considered in my medical decision making (see chart for details).  50 y.o. male presenting with low back pain after fall. Overall, the patient is uncomfortable appearing and does have midline spine tenderness in the lumbar spine. Given the mechanism of the fall, we will image this see, T, L spine entirely. At this time, the patient has no neurologic deficits, including sensory or motor deficits, nor any fecal or urinary incontinence or retention. I do not suspect spinal cord compression at this time. I will initiate symptomatic treatment, and reevaluate the patient after his imaging and treatment is complete. In the meantime, the [place] collar has been placed to protect the patient's cervical spine and the patient will remain in spinal alignment.  ----------------------------------------- 12:01 AM on 08/05/2016 -----------------------------------------  The patient's imaging does not show any fracture of the cervical spine, and at clinically cleared him from his collar. He does not have any abnormal findings on his thoracic x-rays. In the lumbar spine, he has left transverse process fractures in L1 and L2 and a right transverse process and factor and L3. I've spoken to Dr. Wende Mott, the neurosurgeon  on-call, who recommends discharge with symptomatic treatment for the pain that these fractures can lead to. No surgical intervention or brace is needed for treatment of these fractures. ____________________________________________  FINAL CLINICAL IMPRESSION(S) / ED DIAGNOSES  Final diagnoses:  Other closed fracture of first lumbar vertebra, initial encounter ([place])  Other closed fracture of second lumbar vertebra, initial encounter ([place])  Other closed fracture of third lumbar vertebra, initial encounter ([place])  Fall, initial encounter  Contusion of left scapula, initial encounter    Clinical Course       NEW MEDICATIONS STARTED DURING THIS VISIT:  New Prescriptions   DIAZEPAM (VALIUM) 5 MG TABLET    Take 1 tablet (5 mg total) by mouth every 6 (six) hours as needed for anxiety.   IBUPROFEN (ADVIL,MOTRIN) 800 MG TABLET    Take 1 tablet (800 mg total) by mouth every 8 (eight) hours as needed.   OXYCODONE-ACETAMINOPHEN (ROXICET) 5-325 MG TABLET    Take 1-2 tablets by mouth every 4 (four) hours as needed.      Eula Listen, MD 08/05/16 0002

## 2016-08-04 NOTE — ED Notes (Signed)
This RN spoke with MD regarding discussing scan results with pt and family. MD verified asap, current critical patient that needs stabilizing.

## 2016-08-04 NOTE — ED Triage Notes (Signed)
Pt arrived to ED by POV, pt unable to get out of vehicle due to pain in the mid back and tailbone. Pt helped out of vehicle onto bed, placed in RM for triage. Pt reports losing his footing and landing on a "hydralic cylinder" across back, hitting head on tire, and tailbone on cement. Pt denies LOC, N/V, blurred vision.

## 2016-08-04 NOTE — ED Notes (Signed)
Pt. Returned to tx. room in stable condition with no acute changes since departure from unit for scans.   

## 2016-08-04 NOTE — ED Notes (Signed)
Called neurosurgery second call [phone]

## 2016-08-05 MED ORDER — OXYCODONE-ACETAMINOPHEN 5-325 MG PO TABS
1.0000 | ORAL_TABLET | Freq: Once | ORAL | Status: AC
  Administered 2016-08-05: 1 via ORAL
  Filled 2016-08-05: qty 1

## 2016-08-05 NOTE — ED Notes (Signed)

## 2016-08-05 NOTE — Discharge Instructions (Signed)
Your spine fractures are not destabilizing, and cannot cause paraplegia. The treatment for your spine fractures is non-surgical with the goal of pain control. You may take Motrin, with food, for mild to moderate pain. Percocet for severe pain. Valium is for severe muscle spasm. Do not drive within 8 hours of taking Percocet or Valium. You should not be driving until your pain is well controlled, as this can decrease your reaction time inability to reactive to unstable situations.  Return to the emergency department if you develop numbness tingling or weakness, difficulty walking, changes in your bladder or bowel function, or any other symptoms concerning to you.

## 2016-08-11 ENCOUNTER — Encounter: Payer: Self-pay | Admitting: Family Medicine

## 2016-08-11 ENCOUNTER — Ambulatory Visit (INDEPENDENT_AMBULATORY_CARE_PROVIDER_SITE_OTHER): Payer: BLUE CROSS/BLUE SHIELD | Admitting: Family Medicine

## 2016-08-11 VITALS — BP 110/74 | HR 68 | Temp 98.4°F | Ht 71.5 in | Wt 334.5 lb

## 2016-08-11 DIAGNOSIS — S32009A Unspecified fracture of unspecified lumbar vertebra, initial encounter for closed fracture: Secondary | ICD-10-CM | POA: Diagnosis not present

## 2016-08-11 MED ORDER — OXYCODONE-ACETAMINOPHEN 5-325 MG PO TABS: 1.0000 | ORAL_TABLET | ORAL | 0 refills | Status: DC | PRN

## 2016-08-11 NOTE — Progress Notes (Signed)
Dr. Frederico Hamman T. Raenette Sakata, MD, [facility] [place], [address] Phone: [phone] Fax: [phone]  08/11/2016  Patient: Brandon [name], MRN: [id number], DOB: Aug 13, 1966, 50 y.o.  Primary Physician:  Owens Loffler, MD   Chief Complaint  Patient presents with  . Follow-up    [facility] ED  Fall/Fx Vertebrae   Subjective:   Brandon [name] is a 50 y.o. very pleasant male patient who presents with the following:  08/04/2016 DOI  L transverse process fracture L1 and 2. ? R transverse process fracture at L3. He is now feeling better. 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  Dr. Wende Mott consulted in the ER. ([place])  [place] 1:30 PM, kept working until 6 PM. Hurt to cough, sneeze.  Super painful with movement and coughing and sneezing.  Past Medical History, Surgical History, Social History, Family History, Problem List, Medications, and Allergies have been reviewed and updated if relevant.  Patient Active Problem List   Diagnosis Date Noted  . Chest wall pain 05/13/2016  . GERD (gastroesophageal reflux disease) 05/13/2016  . Ventricular ectopy 12/14/2015  . Cervical disc disorder with radiculopathy of cervical region 10/05/2013  . Carpal tunnel syndrome 10/05/2013  . Essential hypertension 06/16/2013  . 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 Basal cell carcinoma 06/25/2011  . Erectile dysfunction 06/09/2011  . Morbid obesity with BMI of 40.0-44.9, adult ([place]) 06/07/2009  . DEPRESSION 06/07/2009  . ATRIAL FIBRILLATION, PAROXYSMAL 06/07/2009  . DIVERTICULITIS, COLON 06/07/2009  . ARTHRITIS 06/07/2009    Past Medical History:  Diagnosis Date  . ARTHRITIS 06/07/2009  . Atrial fibrillation ([place])   . ATRIAL FIBRILLATION, PAROXYSMAL 06/07/2009  . Cervical disc disorder with radiculopathy of cervical region 10/05/2013  . CHICKENPOX, HX OF 06/07/2009  . 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  DEPRESSION 06/07/2009  . DIVERTICULITIS, COLON 06/07/2009  . Hematuria     Past Surgical History:  Procedure Laterality Date  . CARDIAC CATHETERIZATION    . FOOT SURGERY     left foot  . TONSILLECTOMY      Social History   Social History  . Marital status: Single    Spouse name: N/A  . Number of children: N/A  . Years of education: N/A   Occupational History  . self employed [employer]   Social History Main Topics  . Smoking status: Never Smoker  . Smokeless tobacco: Current User    Types: Snuff    Last attempt to quit: 06/17/2011     Comment: occasionally  . Alcohol use 0.0 oz/week     Comment: occ  . Drug use: No  . Sexual activity: Not on file   Other Topics Concern  . Not on file   Social History Narrative   Does not do regular exercise. Former dip - quit about 1 year ago.     Family History  Problem Relation Age of Onset  . Stroke Father   . Arthritis    . Diabetes      1st degree relative  . Stroke      F 1st degree relative <60; M 1st degree relative <50    Allergies  Allergen Reactions  . Biaxin [Clarithromycin]     Pt has history of arrhythmias  . Penicillins     Medication list reviewed and updated in full in [place].  GEN: No fevers, chills. Nontoxic. Primarily MSK c/o today. MSK: Detailed in the HPI GI:  tolerating PO intake without difficulty Neuro: No numbness, parasthesias, or tingling associated. Otherwise the pertinent positives of the ROS are noted above.   Objective:   BP 110/74   Pulse 68   Temp 98.4 F (36.9 C) (Oral)   Ht 5' 11.5" (1.816 m)   Wt (!) 334 lb 8 oz (151.7 kg)   BMI 46.00 kg/m    GEN: WDWN, NAD, Non-toxic, Alert & Oriented x 3 HEENT: Atraumatic, Normocephalic.  Ears and Nose: No external deformity. EXTR: No clubbing/cyanosis/edema NEURO: Normal gait.  PSYCH: Normally interactive. Conversant. Not depressed or anxious appearing.  Calm demeanor.    Bruising around the region of L2 to the right and  left. Tenderness in the paraspinal muscles from l2-s1 b  neurovasc intact  Radiology: Dg Chest 1 View  Result Date: 08/04/2016 CLINICAL DATA:  Status post fall today with a blow to the back. Pain. EXAM: CHEST 1 VIEW COMPARISON:  Single-view of the chest 03/23/2011. FINDINGS: The lungs are clear. Heart size is normal. No pneumothorax or pleural effusion. No fracture is identified. IMPRESSION: Negative chest. Electronically Signed   By: Inge Rise M.D.   On: 08/04/2016 21:14   Dg Thoracic Spine 2 View  Result Date: 08/04/2016 CLINICAL DATA:  Status post fall today with a blow to the back. Thoracic spine pain. Initial encounter. EXAM: THORACIC SPINE 2 VIEWS COMPARISON:  None. FINDINGS: There is no evidence of thoracic spine fracture. Alignment is normal. No other significant bone abnormalities are identified. IMPRESSION: Negative exam. Electronically Signed   By: Inge Rise M.D.   On: 08/04/2016 21:12   Ct Cervical Spine Wo Contrast  Result Date: 08/04/2016 CLINICAL DATA:  50 year old male with fall and neck pain. EXAM: CT CERVICAL SPINE WITHOUT CONTRAST TECHNIQUE: Multidetector CT imaging of the cervical spine was performed without intravenous contrast. Multiplanar CT image reconstructions were also generated. COMPARISON:  C-spine MRI dated 04/24/2013 FINDINGS: Alignment: Normal. Skull base and vertebrae: No acute fracture. No primary bone lesion or focal pathologic process. Soft tissues and spinal canal: No prevertebral fluid or swelling. No visible canal hematoma. Upper chest: Negative. Other: A 1 x 1 cm focal heterogeneity in versus nodule in the left thyroid lobe. Ultrasound may provide better evaluation. IMPRESSION: No acute/ traumatic cervical spine pathology. Electronically Signed   By: Anner Crete M.D.   On: 08/04/2016 20:54   Ct Lumbar Spine Wo Contrast  Result Date: 08/04/2016 CLINICAL DATA:  Status post slip and fall today with a low back injury. Pain. Initial encounter.  EXAM: CT LUMBAR SPINE WITHOUT CONTRAST TECHNIQUE: Multidetector CT imaging of the lumbar spine was performed without intravenous contrast administration. Multiplanar CT image reconstructions were also generated. COMPARISON:  None. FINDINGS: Segmentation: Unremarkable. Alignment: 0.5 cm anterolisthesis L4 on L5 is due to facet arthropathy. Vertebrae: There are acute left transverse process fractures of L1 and L2. There may be a right transverse process fracture L3. No other fracture is identified. 0.4 cm in diameter sclerotic lesion in the L3 vertebral body is compatible with a bone island and unchanged since the prior CT. Paraspinal and other soft tissues: Left adrenal adenoma is unchanged. Disc levels: T11-12 is imaged in the sagittal plane only and negative. T12-L1: Facet arthropathy is worse on the left. This level is otherwise negative. L1-2:  Facet degenerative change.  Otherwise negative. L2-3: Minimal disc bulge without central canal or foraminal stenosis. L3-4: Minimal disc bulge and mild facet degenerative change without central canal or foraminal stenosis. L4-5: Advanced bilateral facet degenerative change.  The disc is uncovered without bulging. The central canal and foramina are open. L5-S1: Negative. IMPRESSION: Acute left transverse process fractures L1 and L2. There may be a nondisplaced right transverse process fracture of L3. No other acute abnormality is identified. Negative for central canal stenosis. Facet arthropathy at L4-5 results in 0.5 cm anterolisthesis. Electronically Signed   By: Inge Rise M.D.   On: 08/04/2016 20:54   Reviewed CT of L spine with patient in the office.  Assessment and Plan:   Lumbar transverse process fracture, closed, initial encounter [facility])  Doing reasonably well while awake after injury of 2 transverse process fractures, L1 and L2. I do not see a L3 transverse process fracture, but cannot be excluded.  Neurosurgery has been consult to do in this case,  and recommended conservative care without operative intervention or bracing. Stable from a pain standpoint one week after injury.  Follow-up: 1 mo  Modified Medications   Modified Medication Previous Medication   OXYCODONE-ACETAMINOPHEN (ROXICET) 5-325 MG TABLET oxyCODONE-acetaminophen (ROXICET) 5-325 MG tablet      Take 1-2 tablets by mouth every 4 (four) hours as needed.    Take 1-2 tablets by mouth every 4 (four) hours as needed.    Signed,  Maud Deed. Maxi Rodas, MD   Patient's Medications  New Prescriptions   No medications on file  Previous Medications   ASPIRIN 325 MG TABLET    Take 325 mg by mouth daily.     DIAZEPAM (VALIUM) 5 MG TABLET    Take 1 tablet (5 mg total) by mouth every 6 (six) hours as needed for anxiety.   DILTIAZEM (TAZTIA XT) 180 MG 24 HR CAPSULE    Take 1 capsule (180 mg total) by mouth daily.   FLECAINIDE (TAMBOCOR) 100 MG TABLET    TAKE ONE TABLET BY MOUTH TWICE DAILY   GABAPENTIN (NEURONTIN) 300 MG CAPSULE    Titrate as directed.   IBUPROFEN (ADVIL,MOTRIN) 800 MG TABLET    Take 1 tablet (800 mg total) by mouth every 8 (eight) hours as needed.   METOPROLOL TARTRATE (LOPRESSOR) 25 MG TABLET    Take 1 tablet (25 mg total) by mouth 2 (two) times daily as needed.   OMEPRAZOLE (PRILOSEC) 40 MG CAPSULE    Take 40 mg by mouth daily.   TRIAMCINOLONE (NASACORT) 55 MCG/ACT AERO NASAL INHALER    Place into the nose.  Modified Medications   Modified Medication Previous Medication   OXYCODONE-ACETAMINOPHEN (ROXICET) 5-325 MG TABLET oxyCODONE-acetaminophen (ROXICET) 5-325 MG tablet      Take 1-2 tablets by mouth every 4 (four) hours as needed.    Take 1-2 tablets by mouth every 4 (four) hours as needed.  Discontinued Medications   No medications on file

## 2016-08-11 NOTE — Progress Notes (Signed)
Pre visit review using our clinic review tool, if applicable. No additional management support is needed unless otherwise documented below in the visit note. 

## 2016-09-19 ENCOUNTER — Ambulatory Visit (INDEPENDENT_AMBULATORY_CARE_PROVIDER_SITE_OTHER): Payer: BLUE CROSS/BLUE SHIELD | Admitting: Cardiovascular Disease

## 2016-09-19 ENCOUNTER — Encounter: Payer: Self-pay | Admitting: Cardiovascular Disease

## 2016-09-19 VITALS — BP 146/96 | HR 64 | Ht 74.0 in | Wt 330.2 lb

## 2016-09-19 DIAGNOSIS — I493 Ventricular premature depolarization: Secondary | ICD-10-CM | POA: Diagnosis not present

## 2016-09-19 DIAGNOSIS — Z6841 Body Mass Index (BMI) 40.0 and over, adult: Secondary | ICD-10-CM

## 2016-09-19 DIAGNOSIS — I10 Essential (primary) hypertension: Secondary | ICD-10-CM

## 2016-09-19 DIAGNOSIS — I480 Paroxysmal atrial fibrillation: Secondary | ICD-10-CM

## 2016-09-19 NOTE — Progress Notes (Addendum)
Cardiology Office Note  Date:  09/19/2016   ID:  Brandon [name], DOB 06/03/1966, MRN [id number]  PCP:  Owens Loffler, MD   Chief Complaint  Patient presents with  . other     sooner 16mo f/u . Last ov 12/14/15. Pt states he is doing well other than breaking 3 bones in his back. Reviewed meds with pt verbally.    HPI:  Brandon [name] is a 50 -year-old gentleman with history of hypertension, obesity, chronic back pain, atrial fibrillation who presents for routine followup of his atrial fibrillation. Remote history of PVCs  In follow-up he reports that he had a fall off of a combine onto a metal disc directly striking his mid and lower lumbar spine. 08/04/2016 Had severe back pain, continues to have residual symptoms but much better  Otherwise reports he is doing well, denies any palpitations concerning for atrial fibrillation.  Discussed his risk factors  CHADS VASC of 1 Currently does not want anticoagulation at this time, prefers to stay on aspirin  Sedentary, no regular exercise program, weight continues to run high Continues to take flecainide, diltiazem. Does not check his blood pressure at home, mildly elevated on today's visit Takes metoprolol only as needed for palpitations. 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 Continued problems with arthritis.  Other past medical history  several years since his last episode of atrial fibrillation.   Cardiac catheterization may 2012 showed no significant CAD, normal ejection fraction greater than 55%. Cholesterol well controlled without any cluster medication  PMH:   has a past medical history of ARTHRITIS (06/07/2009); Atrial fibrillation ([place]); ATRIAL FIBRILLATION, PAROXYSMAL (06/07/2009); Cervical disc disorder with radiculopathy of cervical region  (10/05/2013); CHICKENPOX, HX OF (06/07/2009); Classical migraine (03/03/2012); DEPRESSION (06/07/2009); DIVERTICULITIS, COLON (06/07/2009); and Hematuria.  PSH:    Past Surgical History:  Procedure Laterality Date  . CARDIAC CATHETERIZATION    . FOOT SURGERY     left foot  . TONSILLECTOMY      Current Outpatient Prescriptions  Medication Sig Dispense Refill  . aspirin 325 MG tablet Take 325 mg by mouth daily.      Marland Kitchen diltiazem (TAZTIA XT) 180 MG 24 hr capsule Take 1 capsule (180 mg total) by mouth daily. 90 capsule 3  . flecainide (TAMBOCOR) 100 MG tablet TAKE ONE TABLET BY MOUTH TWICE DAILY 180 tablet 3  . gabapentin (NEURONTIN) 300 MG capsule Titrate as directed. 180 capsule 1  . omeprazole (PRILOSEC) 40 MG capsule Take 40 mg by mouth daily.    Marland Kitchen triamcinolone (NASACORT) 55 MCG/ACT AERO nasal inhaler Place into the nose.     No current facility-administered medications for this visit.      Allergies:   Biaxin [clarithromycin] and Penicillins   Social History:  The patient  reports that he has never smoked. His smokeless tobacco use includes Snuff. He reports that he drinks alcohol. He reports that he does not use drugs.   Family History:   family history includes Stroke in his father.    Review of Systems: Review of Systems  Constitutional: Negative.   Respiratory: Negative.   Cardiovascular: Negative.   Gastrointestinal: Negative.   Musculoskeletal: Positive for back pain.  Neurological: Negative.   Psychiatric/Behavioral: Negative.   All other systems reviewed and are negative.    PHYSICAL EXAM: VS:  BP (!) 146/96 (BP Location: Left Arm, Patient Position: Sitting, Cuff Size: Large)   Pulse 64   Ht 6\' 2"  (1.88 m)   Wt (!) 330 lb 4 oz (149.8 kg)   BMI 42.40 kg/m  , BMI Body mass index is 42.4 kg/m. GEN: Well nourished, well developed, in no acute distress, obese  HEENT: normal  Neck: no JVD, carotid bruits, or masses Cardiac: RRR; no murmurs, rubs, or gallops,Trace  lower extremity edema  Respiratory:  clear to auscultation bilaterally, normal work of breathing GI: soft, nontender, nondistended, + BS MS: no deformity or atrophy  Skin: warm and dry, no rash Neuro:  Strength and sensation are intact Psych: euthymic mood, full affect    Recent Labs: 04/18/2016: ALT 24; BUN 13; Creatinine, Ser 0.82; Hemoglobin 14.8; Platelets 236.0; Potassium 3.8; Sodium 139; TSH 2.68    Lipid Panel Lab Results  Component Value Date   CHOL 140 04/18/2016   HDL 26.30 (L) 04/18/2016   LDLCALC 92 04/18/2016   TRIG 107.0 04/18/2016      Wt Readings from Last 3 Encounters:  09/19/16 (!) 330 lb 4 oz (149.8 kg)  08/11/16 (!) 334 lb 8 oz (151.7 kg)  08/04/16 (!) 325 lb (147.4 kg)       ASSESSMENT AND PLAN:  Atrial fibrillation, Paroxysmal - Plan: EKG 12-Lead No recent documentation of atrial fibrillation Long discussion with him concerning risk of atrial fibrillation, risk and benefit of anticoagulation Discussed CHADSVASC with him, score of 1 He prefers to stay on aspirin at this time  Essential hypertension - Plan: EKG 12-Lead Recommended he closely monitor blood pressure at home, improved on recheck No changes made to his medications at this time If blood pressure continues to run high, would start HCTZ given trace leg edema  Morbid obesity with BMI of 40.0-44.9, adult ([place]) We have encouraged continued exercise, careful diet management in an effort to lose weight.  Ventricular ectopy Denies any ectopy concerning for PVCs or atrial fibrillation   Total encounter time more than 25 minutes  Greater than 50% was spent in counseling and coordination of care with the patient   Disposition:   F/U  12 months   Orders Placed This Encounter  Procedures  . EKG 12-Lead     Signed, Esmond Plants, M.D., Ph.D. 09/19/2016  [place], [place]

## 2016-09-19 NOTE — Patient Instructions (Addendum)
Medication Instructions:   No medication changes made  Goal blood pressure is  <140 on the top <90 on the bottom  If needed we could start HCTZ (diuretic) for blood pressure  Labwork:  No new labs needed  Testing/Procedures:  No further testing at this time   I recommend watching educational videos on topics of interest to you at:       [email or web address]  Enter code: HEARTCARE    Follow-Up: It was a pleasure seeing you in the office today. Please call us if you have new issues that need to be addressed before your next appt.  [phone]  Your physician wants you to follow-up in: 6 months.  You will receive a reminder letter in the mail two months in advance. If you don't receive a letter, please call our office to schedule the follow-up appointment.  If you need a refill on your cardiac medications before your next appointment, please call your pharmacy.

## 2016-10-29 ENCOUNTER — Ambulatory Visit (INDEPENDENT_AMBULATORY_CARE_PROVIDER_SITE_OTHER): Payer: BLUE CROSS/BLUE SHIELD | Admitting: Family Medicine

## 2016-10-29 ENCOUNTER — Encounter: Payer: Self-pay | Admitting: Family Medicine

## 2016-10-29 VITALS — BP 140/88 | HR 66 | Temp 97.8°F | Ht 71.5 in | Wt 332.5 lb

## 2016-10-29 DIAGNOSIS — S39011A Strain of muscle, fascia and tendon of abdomen, initial encounter: Secondary | ICD-10-CM

## 2016-10-29 DIAGNOSIS — S32009D Unspecified fracture of unspecified lumbar vertebra, subsequent encounter for fracture with routine healing: Secondary | ICD-10-CM

## 2016-10-29 MED ORDER — GABAPENTIN 300 MG PO CAPS: ORAL_CAPSULE | ORAL | 3 refills | Status: DC

## 2016-10-29 MED ORDER — TIZANIDINE HCL 4 MG PO TABS
4.0000 mg | ORAL_TABLET | Freq: Every evening | ORAL | 2 refills | Status: AC
Start: 2016-10-29 — End: 2016-11-08

## 2016-10-29 NOTE — Progress Notes (Signed)
Dr. Frederico Hamman T. Marlette Curvin, MD, [facility] [place], [address] Phone: [phone] Fax: [phone]  10/29/2016  Patient: Brandon [name], MRN: [id number], DOB: 07/01/1966, 50 y.o.  Primary Physician:  Owens Loffler, MD   Chief Complaint  Patient presents with  . Follow-up    on injury   Subjective:   Brandon [name] is a 49 y.o. very pleasant male patient who presents with the following:  F/u fx as below Still having problems with left side, hurting most of the time, hurts with pulling. Still pushing on that side.   He has done pretty well after his transverse process fractures as below. He has not had any back pain at all recently. He is back to work full-time. Still does have some pain. Primarily he is having pain now on the left side at the bottom rib posteriorly and laterally. He has not had any one specific injury. No numbness or tingling that is new or different compared to his baseline.  Wt Readings from Last 3 Encounters:  10/29/16 (!) 332 lb 8 oz (150.8 kg)  09/19/16 (!) 330 lb 4 oz (149.8 kg)  08/11/16 (!) 334 lb 8 oz (151.7 kg)    L side pain, not near fx site  08/11/2016 Last OV with Owens Loffler, MD  08/04/2016 DOI  L transverse process fracture L1 and 2. ? R transverse process fracture at L3. He is now feeling better. 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  Dr. Wende Mott consulted in the ER. ([place])  [place] 1:30 PM, kept working until 6 PM. Hurt to cough, sneeze.  Super painful with movement and coughing and sneezing.  Past Medical History, Surgical History, Social History, Family History, Problem List, Medications, and Allergies have been reviewed and updated if relevant.  Patient Active Problem List   Diagnosis Date Noted  . Chest wall pain 05/13/2016  . GERD (gastroesophageal reflux disease) 05/13/2016  . Ventricular ectopy 12/14/2015  . Cervical  disc disorder with radiculopathy of cervical region 10/05/2013  . Carpal tunnel syndrome 10/05/2013  . Essential hypertension 06/16/2013  . 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 Basal cell carcinoma 06/25/2011  . Erectile dysfunction 06/09/2011  . Morbid obesity with BMI of 40.0-44.9, adult ([place]) 06/07/2009  . DEPRESSION 06/07/2009  . ATRIAL FIBRILLATION, PAROXYSMAL 06/07/2009  . DIVERTICULITIS, COLON 06/07/2009  . ARTHRITIS 06/07/2009    Past Medical History:  Diagnosis Date  . ARTHRITIS 06/07/2009  . Atrial fibrillation ([place])   . ATRIAL FIBRILLATION, PAROXYSMAL 06/07/2009  . Cervical disc disorder with radiculopathy of cervical region 10/05/2013  . CHICKENPOX, HX OF 06/07/2009  . 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 DEPRESSION 06/07/2009  . DIVERTICULITIS, COLON 06/07/2009  . Hematuria     Past Surgical History:  Procedure Laterality Date  . CARDIAC CATHETERIZATION    . FOOT SURGERY     left foot  . TONSILLECTOMY      Social History   Social History  . Marital status: Single    Spouse name: N/A  . Number of children: N/A  . Years of education: N/A   Occupational History  . self employed [employer]   Social History Main Topics  . Smoking status: Never Smoker  . Smokeless tobacco: Current User    Types: Snuff    Last attempt to quit: 06/17/2011     Comment: occasionally  . Alcohol use 0.0 oz/week     Comment: occ  .  Drug use: No  . Sexual activity: Not on file   Other Topics Concern  . Not on file   Social History Narrative   Does not do regular exercise. Former dip - quit about 1 year ago.     Family History  Problem Relation Age of Onset  . Stroke Father   . Arthritis    . Diabetes      1st degree relative  . Stroke      F 1st degree relative <60; M 1st degree relative <50    Allergies  Allergen Reactions  . Biaxin [Clarithromycin]     Pt has history of arrhythmias  . Penicillins     Medication list reviewed and updated in full in [place].  GEN:  No fevers, chills. Nontoxic. Primarily MSK c/o today. MSK: Detailed in the HPI GI: tolerating PO intake without difficulty Neuro: No numbness, parasthesias, or tingling associated. Otherwise the pertinent positives of the ROS are noted above.   Objective:   BP 140/88 (BP Location: Right Arm, Patient Position: Sitting, Cuff Size: Large)   Pulse 66   Temp 97.8 F (36.6 C) (Oral)   Ht 5' 11.5" (1.816 m)   Wt (!) 332 lb 8 oz (150.8 kg)   SpO2 97%   BMI 45.73 kg/m    GEN: WDWN, NAD, Non-toxic, Alert & Oriented x 3 HEENT: Atraumatic, Normocephalic.  Ears and Nose: No external deformity. EXTR: No clubbing/cyanosis/edema NEURO: Normal gait.  PSYCH: Normally interactive. Conversant. Not depressed or anxious appearing.  Calm demeanor.    No tenderness around the lumbar spine at all from L1-S1.  Left side lateral and posterior rib inferiorly tender muscular attachment adjacent to this.  neurovasc intact  Radiology: No results found. Reviewed CT of L spine with patient in the office.  Assessment and Plan:   Abdominal muscle strain, initial encounter  Closed fracture of transverse process of lumbar vertebra with routine healing, subsequent encounter  Most likely pulled muscle adjacent to the rib, more likely transversus abdominis versus internal oblique. Maybe secondary to compensation from fracture initially.  Suspect that this will resolve on its own, zanaflex and OTC NSAIDs.  Follow-up: No Follow-up on file.  Meds ordered this encounter  Medications  . gabapentin (NEURONTIN) 300 MG capsule    Sig: Titrate as directed.    Dispense:  810 capsule    Refill:  3    3 month supply  . tiZANidine (ZANAFLEX) 4 MG tablet    Sig: Take 1 tablet (4 mg total) by mouth Nightly.    Dispense:  30 tablet    Refill:  2   Medications Discontinued During This Encounter  Medication Reason  . gabapentin (NEURONTIN) 300 MG capsule Reorder   No orders of the defined types were placed in  this encounter.   Signed,  Maud Deed. Syniyah Bourne, MD   Allergies as of 10/29/2016      Reactions   Biaxin [clarithromycin]    Pt has history of arrhythmias   Penicillins       Medication List       Accurate as of 10/29/16  2:03 PM. Always use your most recent med list.          aspirin 325 MG tablet Take 325 mg by mouth daily.   diltiazem 180 MG 24 hr capsule Commonly known as:  TAZTIA XT Take 1 capsule (180 mg total) by mouth daily.   flecainide 100 MG tablet Commonly known as:  TAMBOCOR TAKE ONE TABLET  BY MOUTH TWICE DAILY   gabapentin 300 MG capsule Commonly known as:  NEURONTIN Titrate as directed.   omeprazole 40 MG capsule Commonly known as:  PRILOSEC Take 40 mg by mouth daily.   tiZANidine 4 MG tablet Commonly known as:  ZANAFLEX Take 1 tablet (4 mg total) by mouth Nightly.   triamcinolone 55 MCG/ACT Aero nasal inhaler Commonly known as:  NASACORT Place into the nose.

## 2016-11-17 ENCOUNTER — Encounter: Payer: Self-pay | Admitting: Family Medicine

## 2016-11-25 ENCOUNTER — Telehealth: Payer: Self-pay | Admitting: Cardiovascular Disease

## 2016-11-25 NOTE — Telephone Encounter (Signed)
At last ov 11/17, pt was advised to monitor BP.  "Recommended he closely monitor blood pressure at home, improved on recheck No changes made to his medications at this time If blood pressure continues to run high, would start HCTZ given trace leg edema"  What dose of HCTZ would you like him on?  12.5 mg or 25 mg once daily?

## 2016-11-25 NOTE — Telephone Encounter (Signed)
Pt c/o BP issue: STAT if pt c/o blurred vision, one-sided weakness or slurred speech  1. What are your last 5 BP readings?  11/24/16: 152/90 HR 76  2. Are you having any other symptoms (ex. Dizziness, headache, blurred vision, passed out)?   He states no but he knows it is still running a little high   3. What is your BP issue?   States last time he was here he was to keep a record of his BP  And it if stayed a little high that he was to call us  Asked him if he did that he said he only has the ones from yesterday  But knows it is high, states "under all the stress I am under  I know it is high"  Would like some advise on this  Please call back

## 2016-11-25 NOTE — Telephone Encounter (Signed)
For high blood pressure would try HCTZ 3 to 4 times per week If blood pressure continues to run high, may need to increase to daily If he does take HCTZ, would increase his potassium intake with banana

## 2016-11-26 NOTE — Telephone Encounter (Signed)
How much HCTZ?

## 2016-11-26 NOTE — Telephone Encounter (Signed)
25 mg pill

## 2016-11-27 MED ORDER — HYDROCHLOROTHIAZIDE 25 MG PO TABS: 25.0000 mg | ORAL_TABLET | Freq: Every day | ORAL | 6 refills | Status: DC

## 2016-11-27 NOTE — Telephone Encounter (Signed)
Spoke with patient and he states that he has been under some additional stress lately. He states that it has been elevated and he wanted to try and get it under control better. Reviewed Dr. Donivan Scull recommendations and he verbalized understanding with no further questions at this time. Sent in HCTZ for 25 mg once daily and reviewed with him ways to increase potassium.

## 2016-11-28 ENCOUNTER — Telehealth: Payer: Self-pay | Admitting: Cardiovascular Disease

## 2016-11-28 MED ORDER — HYDROCHLOROTHIAZIDE 25 MG PO TABS: 25.0000 mg | ORAL_TABLET | Freq: Every day | ORAL | 6 refills | Status: DC

## 2016-11-28 NOTE — Telephone Encounter (Signed)
Patient calling stating pharmacy did not get refill for HCTZ 25 mg po daily .  Please send again or call pharmacy .

## 2016-11-28 NOTE — Telephone Encounter (Signed)
Tried to contact the [facility] to make sure they did receive the HCTZ 25 mg.

## 2016-11-28 NOTE — Telephone Encounter (Signed)
Pt states his BP medication was supposed to be called to his pharmacy , yesterday he states he called, but his pharmacy has no record. Please call and advise.

## 2017-02-04 ENCOUNTER — Encounter: Payer: Self-pay | Admitting: Family Medicine

## 2017-02-05 MED ORDER — DIAZEPAM 5 MG PO TABS
5.0000 mg | ORAL_TABLET | Freq: Three times a day (TID) | ORAL | 0 refills | Status: DC | PRN
Start: 2017-02-05 — End: 2017-04-29

## 2017-02-05 NOTE — Telephone Encounter (Signed)
Spoke with patient. Mother died a few months ago - does not think he is depressed, but more anxious, more depressed. Sometimes has a hard time calming down / slowing down at night. May be having some panic attacks.   I am going to call in a short term benzo for the patient.

## 2017-03-18 NOTE — Progress Notes (Signed)
Cardiology Office Note  Date:  03/20/2017   ID:  Brandon [name], DOB 08-18-1966, MRN [id number]  PCP:  Brandon Loffler, MD   Chief Complaint  Patient presents with  . other    6 month f/u no complaints today. Meds reviewed verbally with pt.    HPI:   [name] is a 51 -year-old gentleman with history of  hypertension,  obesity,  chronic back pain,  atrial fibrillation , previously declined anticoagulation Remote history of PVCs who presents for routine followup of his atrial fibrillation.  No diabetes, smoking, cholesterol good Recently lost his mother, she was a smoker, age 29 happened acutely  Previously fell off of a combine onto a metal disc directly striking his mid and lower lumbar spine. 08/04/2016 Had severe back pain, continues to have residual symptoms but much better  Otherwise reports he is doing well,  denies any palpitations concerning for atrial fibrillation.   Blood pressure running low today, denies any orthostasis symptoms  Discussed his risk factors  CHADS VASC of 1 We've previously discussed whether he wanted to be on anticoagulation. He was aware of risk and benefit of stroke and bleeding and preferred to stay on aspirin  Sedentary, no regular exercise program, weight continues to run high Continues to take flecainide, diltiazem.  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 Continued problems with arthritis.  Other past medical history  several years since his last episode of atrial fibrillation.   Cardiac catheterization may 2012 showed no significant CAD, normal ejection fraction greater than 55%. Cholesterol well controlled without any cluster medication  PMH:   has a past medical history of ARTHRITIS (06/07/2009); Atrial fibrillation ([place]); ATRIAL FIBRILLATION,  PAROXYSMAL (06/07/2009); Cervical disc disorder with radiculopathy of cervical region (10/05/2013); CHICKENPOX, HX OF (06/07/2009); Classical migraine (03/03/2012); DEPRESSION (06/07/2009); DIVERTICULITIS, COLON (06/07/2009); and Hematuria.  PSH:    Past Surgical History:  Procedure Laterality Date  . CARDIAC CATHETERIZATION    . FOOT SURGERY     left foot  . TONSILLECTOMY      Current Outpatient Prescriptions  Medication Sig Dispense Refill  . aspirin 325 MG tablet Take 325 mg by mouth daily.      . diazepam (VALIUM) 5 MG tablet Take 1 tablet (5 mg total) by mouth every 8 (eight) hours as needed for anxiety. 30 tablet 0  . diltiazem (TAZTIA XT) 180 MG 24 hr capsule Take 1 capsule (180 mg total) by mouth daily. 90 capsule 3  . flecainide (TAMBOCOR) 100 MG tablet TAKE ONE TABLET BY MOUTH TWICE DAILY 180 tablet 3  . hydrochlorothiazide (HYDRODIURIL) 25 MG tablet Take 1 tablet (25 mg total) by mouth daily. 30 tablet 6  . omeprazole (PRILOSEC) 40 MG capsule Take 40 mg by mouth daily.     No current facility-administered medications for this visit.      Allergies:   Biaxin [clarithromycin] and Penicillins   Social History:  The patient  reports that he has never smoked. His smokeless tobacco use includes Snuff. He reports that he drinks alcohol. He reports that he does not use drugs.   Family History:   family history includes Stroke in his father.    Review of Systems: Review of Systems  Constitutional: Negative.   Respiratory: Negative.   Cardiovascular: Negative.   Gastrointestinal: Negative.   Musculoskeletal: Positive for  back pain.  Neurological: Negative.   Psychiatric/Behavioral: Negative.   All other systems reviewed and are negative.    PHYSICAL EXAM: VS:  BP 100/70 (BP Location: Left Arm, Patient Position: Sitting, Cuff Size: Large)   Pulse 60   Ht 6\' 2"  (1.88 m)   Wt (!) 325 lb 4 oz (147.5 kg)   BMI 41.76 kg/m  , BMI Body mass index is 41.76 kg/m.  GEN: Well  nourished, well developed, in no acute distress, obese  HEENT: normal  Neck: no JVD, carotid bruits, or masses Cardiac: RRR; no murmurs, rubs, or gallops,Trace lower extremity edema  Respiratory:  clear to auscultation bilaterally, normal work of breathing GI: soft, nontender, nondistended, + BS MS: no deformity or atrophy  Skin: warm and dry, no rash Neuro:  Strength and sensation are intact Psych: euthymic mood, full affect    Recent Labs: 04/18/2016: ALT 24; BUN 13; Creatinine, Ser 0.82; Hemoglobin 14.8; Platelets 236.0; Potassium 3.8; Sodium 139; TSH 2.68    Lipid Panel Lab Results  Component Value Date   CHOL 140 04/18/2016   HDL 26.30 (L) 04/18/2016   LDLCALC 92 04/18/2016   TRIG 107.0 04/18/2016      Wt Readings from Last 3 Encounters:  03/20/17 (!) 325 lb 4 oz (147.5 kg)  10/29/16 (!) 332 lb 8 oz (150.8 kg)  09/19/16 (!) 330 lb 4 oz (149.8 kg)       ASSESSMENT AND PLAN:  Atrial fibrillation, Paroxysmal - Plan: EKG 12-Lead Denies having any atrial fibrillation symptoms Discussed CHADSVASC with him, score of 1 He prefers to stay on aspirin at this time  Essential hypertension - Plan: EKG 12-Lead Blood pressure low on today's visit, we will change his HCTZ to every other day 329 systolic by my check sitting Works out in the hot sun all summer If he has orthostasis we may need to hold HCTZ  Morbid obesity with BMI of 40.0-44.9, adult ([place]) We have encouraged continued exercise, careful diet management in an effort to lose weight.  Ventricular ectopy Denies any ectopy concerning for PVCs or atrial fibrillation   Total encounter time more than 25 minutes  Greater than 50% was spent in counseling and coordination of care with the patient   Disposition:   F/U  12 months   No orders of the defined types were placed in this encounter.    Signed, Esmond Plants, M.D., Ph.D. 03/20/2017  [place], [place]

## 2017-03-20 ENCOUNTER — Encounter: Payer: Self-pay | Admitting: Cardiovascular Disease

## 2017-03-20 ENCOUNTER — Ambulatory Visit (INDEPENDENT_AMBULATORY_CARE_PROVIDER_SITE_OTHER): Payer: BLUE CROSS/BLUE SHIELD | Admitting: Cardiovascular Disease

## 2017-03-20 VITALS — BP 100/70 | HR 60 | Ht 74.0 in | Wt 325.2 lb

## 2017-03-20 DIAGNOSIS — I480 Paroxysmal atrial fibrillation: Secondary | ICD-10-CM

## 2017-03-20 DIAGNOSIS — R0789 Other chest pain: Secondary | ICD-10-CM

## 2017-03-20 DIAGNOSIS — I10 Essential (primary) hypertension: Secondary | ICD-10-CM

## 2017-03-20 DIAGNOSIS — Z6841 Body Mass Index (BMI) 40.0 and over, adult: Secondary | ICD-10-CM | POA: Diagnosis not present

## 2017-03-20 NOTE — Patient Instructions (Addendum)
Medication Instructions:   Please take HCTZ every other day for low blood pressure  Labwork:  No new labs needed  Testing/Procedures:  No further testing at this time   I recommend watching educational videos on topics of interest to you at:       www.goemmi.com  Enter code: HEARTCARE    Follow-Up: It was a pleasure seeing you in the office today. Please call us if you have new issues that need to be addressed before your next appt.  [phone]  Your physician wants you to follow-up in: 12 months.  You will receive a reminder letter in the mail two months in advance. If you don't receive a letter, please call our office to schedule the follow-up appointment.  If you need a refill on your cardiac medications before your next appointment, please call your pharmacy.

## 2017-04-24 ENCOUNTER — Other Ambulatory Visit (INDEPENDENT_AMBULATORY_CARE_PROVIDER_SITE_OTHER): Payer: BLUE CROSS/BLUE SHIELD

## 2017-04-24 DIAGNOSIS — Z0000 Encounter for general adult medical examination without abnormal findings: Secondary | ICD-10-CM

## 2017-04-24 LAB — CBC WITH DIFFERENTIAL/PLATELET
Basophils Absolute: 0 10*3/uL (ref 0.0–0.1)
Basophils Relative: 0.7 % (ref 0.0–3.0)
EOS PCT: 1.9 % (ref 0.0–5.0)
Eosinophils Absolute: 0.1 10*3/uL (ref 0.0–0.7)
HEMATOCRIT: 45.5 % (ref 39.0–52.0)
Hemoglobin: 15.4 g/dL (ref 13.0–17.0)
LYMPHS PCT: 23.9 % (ref 12.0–46.0)
Lymphs Abs: 1.7 10*3/uL (ref 0.7–4.0)
MCHC: 33.8 g/dL (ref 30.0–36.0)
MCV: 86 fl (ref 78.0–100.0)
MONOS PCT: 7.7 % (ref 3.0–12.0)
Monocytes Absolute: 0.6 10*3/uL (ref 0.1–1.0)
NEUTROS ABS: 4.8 10*3/uL (ref 1.4–7.7)
Neutrophils Relative %: 65.8 % (ref 43.0–77.0)
PLATELETS: 257 10*3/uL (ref 150.0–400.0)
RBC: 5.28 Mil/uL (ref 4.22–5.81)
RDW: 15 % (ref 11.5–15.5)
WBC: 7.3 10*3/uL (ref 4.0–10.5)

## 2017-04-24 LAB — BASIC METABOLIC PANEL
BUN: 13 mg/dL (ref 6–23)
CHLORIDE: 102 meq/L (ref 96–112)
CO2: 29 mEq/L (ref 19–32)
CREATININE: 1 mg/dL (ref 0.40–1.50)
Calcium: 9.3 mg/dL (ref 8.4–10.5)
GFR: 82.98 mL/min (ref >60.00)
Glucose, Bld: 89 mg/dL (ref 70–99)
POTASSIUM: 4.2 meq/L (ref 3.5–5.1)
Sodium: 139 mEq/L (ref 135–145)

## 2017-04-24 LAB — HEPATIC FUNCTION PANEL
ALBUMIN: 4 g/dL (ref 3.5–5.2)
ALT: 22 U/L (ref 0–53)
AST: 18 U/L (ref 0–37)
Alkaline Phosphatase: 37 U/L — ABNORMAL LOW (ref 39–117)
Bilirubin, Direct: 0.2 mg/dL (ref 0.0–0.3)
TOTAL PROTEIN: 6.6 g/dL (ref 6.0–8.3)
Total Bilirubin: 0.8 mg/dL (ref 0.2–1.2)

## 2017-04-24 LAB — TSH: TSH: 2.24 u[IU]/mL (ref 0.35–4.50)

## 2017-04-24 LAB — LIPID PANEL
CHOLESTEROL: 126 mg/dL (ref 0–200)
HDL: 23.8 mg/dL — ABNORMAL LOW (ref >39.00)
LDL Cholesterol: 83 mg/dL (ref 0–99)
NonHDL: 102.37
TRIGLYCERIDES: 99 mg/dL (ref 0.0–149.0)
Total CHOL/HDL Ratio: 5
VLDL: 19.8 mg/dL (ref 0.0–40.0)

## 2017-04-24 LAB — PSA: PSA: 0.83 ng/mL (ref 0.10–4.00)

## 2017-04-29 ENCOUNTER — Ambulatory Visit (INDEPENDENT_AMBULATORY_CARE_PROVIDER_SITE_OTHER): Payer: BLUE CROSS/BLUE SHIELD | Admitting: Family Medicine

## 2017-04-29 ENCOUNTER — Encounter: Payer: Self-pay | Admitting: Family Medicine

## 2017-04-29 VITALS — BP 128/84 | HR 54 | Temp 97.6°F | Ht 72.25 in | Wt 321.2 lb

## 2017-04-29 DIAGNOSIS — Z0000 Encounter for general adult medical examination without abnormal findings: Secondary | ICD-10-CM | POA: Diagnosis not present

## 2017-04-29 MED ORDER — DIAZEPAM 5 MG PO TABS: 5.0000 mg | ORAL_TABLET | Freq: Three times a day (TID) | ORAL | 0 refills | Status: DC | PRN

## 2017-04-29 NOTE — Progress Notes (Signed)
Dr. Frederico Hamman T. Duru Reiger, MD, [facility] [place], [address] Phone: [phone] Fax: [phone]  04/29/2017  Patient: Brandon [name], MRN: [id number], DOB: [birth year], 51 y.o.  Primary Physician:  Owens Loffler, MD   Chief Complaint  Patient presents with  . Annual Exam   Subjective:   Brandon [name] is a 51 y.o. pleasant patient who presents with the following:  Preventative Health Maintenance Visit:  Health Maintenance Summary Reviewed and updated, unless pt declines services.  Lost 12 pounds.  He is still coping with the death of his mother.  Tobacco History Reviewed. Alcohol: No concerns, no excessive use Exercise Habits: minimal activity, rec at least 30 mins 5 times a week STD concerns: no risk or activity to increase risk Drug Use: None Encouraged self-testicular check  Health Maintenance  Topic Date Due  . HIV Screening  01/08/1979  . INFLUENZA VACCINE  06/03/2017  . TETANUS/TDAP  12/04/2020  . COLONOSCOPY  05/15/2024  . Hepatitis C Screening  Completed   Immunization History  Administered Date(s) Administered  . Influenza Whole 11/15/2009, 07/14/2011  . Influenza, Seasonal, Injecte, Preservative Fre 11/08/2012  . Influenza,inj,Quad PF,36+ Mos 08/24/2013, 08/16/2014, 08/20/2015, 06/23/2016  . Td 12/04/2010   Patient Active Problem List   Diagnosis Date Noted  . Chest wall pain 05/13/2016  . GERD (gastroesophageal reflux disease) 05/13/2016  . Ventricular ectopy 12/14/2015  . Cervical disc disorder with radiculopathy of cervical region 10/05/2013  . Carpal tunnel syndrome 10/05/2013  . Essential hypertension 06/16/2013  . 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 Basal cell carcinoma 06/25/2011  . Erectile dysfunction 06/09/2011  . Morbid obesity with BMI of 40.0-44.9, adult ([facility]) 06/07/2009  . DEPRESSION 06/07/2009  . ATRIAL FIBRILLATION, PAROXYSMAL 06/07/2009  . DIVERTICULITIS,  COLON 06/07/2009  . ARTHRITIS 06/07/2009   Past Medical History:  Diagnosis Date  . ARTHRITIS 06/07/2009  . Atrial fibrillation ([place])   . ATRIAL FIBRILLATION, PAROXYSMAL 06/07/2009  . Cervical disc disorder with radiculopathy of cervical region 10/05/2013  . CHICKENPOX, HX OF 06/07/2009  . 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 DEPRESSION 06/07/2009  . DIVERTICULITIS, COLON 06/07/2009  . Hematuria    Past Surgical History:  Procedure Laterality Date  . CARDIAC CATHETERIZATION    . FOOT SURGERY     left foot  . TONSILLECTOMY     Social History   Social History  . Marital status: Single    Spouse name: N/A  . Number of children: N/A  . Years of education: N/A   Occupational History  . self employed [employer]   Social History Main Topics  . Smoking status: Never Smoker  . Smokeless tobacco: Current User    Types: Snuff    Last attempt to quit: 06/17/2011     Comment: occasionally  . Alcohol use 0.0 oz/week     Comment: occ  . Drug use: No  . Sexual activity: Not on file   Other Topics Concern  . Not on file   Social History Narrative   Does not do regular exercise. Former dip - quit about 1 year ago.    Family History  Problem Relation Age of Onset  . Stroke Father   . Arthritis Unknown   . Diabetes Unknown        1st degree relative  . Stroke Unknown        F 1st degree relative <60; M 1st degree relative <50   Allergies  Allergen Reactions  .  Biaxin [Clarithromycin]     Pt has history of arrhythmias  . Penicillins     Medication list has been reviewed and updated.   General: Denies fever, chills, sweats. No significant weight loss. Eyes: Denies blurring,significant itching ENT: Denies earache, sore throat, and hoarseness. Cardiovascular: Denies chest pains, palpitations, dyspnea on exertion Respiratory: Denies cough, dyspnea at rest,wheeezing Breast: no concerns about lumps GI: Denies nausea, vomiting, diarrhea, constipation, change in bowel habits, abdominal  pain, melena, hematochezia GU: Denies penile discharge, ED, urinary flow / outflow problems. No STD concerns. Musculoskeletal: + back and neck pain Derm: Denies rash, itching Neuro: Denies  paresthesias, frequent falls, frequent headaches Psych: Denies depression, anxiety Endocrine: Denies cold intolerance, heat intolerance, polydipsia Heme: Denies enlarged lymph nodes Allergy: No hayfever  Objective:   BP 128/84 (BP Location: Right Arm, Patient Position: Sitting, Cuff Size: Large)   Pulse (!) 54   Temp 97.6 F (36.4 C) (Oral)   Ht 6' 0.25" (1.835 m)   Wt (!) 321 lb 4 oz (145.7 kg)   SpO2 94%   BMI 43.27 kg/m  Ideal Body Weight: Weight in (lb) to have BMI = 25: 185.2  No exam data present  GEN: well developed, well nourished, no acute distress Eyes: conjunctiva and lids normal, PERRLA, EOMI ENT: TM clear, nares clear, oral exam WNL Neck: supple, no lymphadenopathy, no thyromegaly, no JVD Pulm: clear to auscultation and percussion, respiratory effort normal CV: regular rate and rhythm, S1-S2, no murmur, rub or gallop, no bruits, peripheral pulses normal and symmetric, no cyanosis, clubbing, edema or varicosities GI: soft, non-tender; no hepatosplenomegaly, masses; active bowel sounds all quadrants GU: no hernia, testicular mass, penile discharge Lymph: no cervical, axillary or inguinal adenopathy MSK: gait normal, muscle tone and strength WNL, no joint swelling, effusions, discoloration, crepitus  SKIN: clear, good turgor, color WNL, no rashes, lesions, or ulcerations Neuro: normal mental status, normal strength, sensation, and motion Psych: alert; oriented to person, place and time, normally interactive and not anxious or depressed in appearance. All labs reviewed with patient.  Lipids:    Component Value Date/Time   CHOL 126 04/24/2017 0918   TRIG 99.0 04/24/2017 0918   HDL 23.80 (L) 04/24/2017 0918   VLDL 19.8 04/24/2017 0918   CHOLHDL 5 04/24/2017 0918   CBC: CBC  Latest Ref Rng & Units 04/24/2017 04/18/2016 03/21/2015  WBC 4.0 - 10.5 K/uL 7.3 6.9 6.7  Hemoglobin 13.0 - 17.0 g/dL 15.4 14.8 15.1  Hematocrit 39.0 - 52.0 % 45.5 43.8 44.5  Platelets 150.0 - 400.0 K/uL 257.0 236.0 220.2    Basic Metabolic Panel:    Component Value Date/Time   NA 139 04/24/2017 0918   K 4.2 04/24/2017 0918   CL 102 04/24/2017 0918   CO2 29 04/24/2017 0918   BUN 13 04/24/2017 0918   CREATININE 1.00 04/24/2017 0918   GLUCOSE 89 04/24/2017 0918   CALCIUM 9.3 04/24/2017 0918   Hepatic Function Latest Ref Rng & Units 04/24/2017 04/18/2016 03/21/2015  Total Protein 6.0 - 8.3 g/dL 6.6 6.5 6.7  Albumin 3.5 - 5.2 g/dL 4.0 3.9 3.9  AST 0 - 37 U/L '18 16 19  ' ALT 0 - 53 U/L '22 24 25  ' Alk Phosphatase 39 - 117 U/L 37(L) 48 48  Total Bilirubin 0.2 - 1.2 mg/dL 0.8 0.6 0.5  Bilirubin, Direct 0.0 - 0.3 mg/dL 0.2 0.1 0.1    Lab Results  Component Value Date   TSH 2.24 04/24/2017   Lab Results  Component Value Date  PSA 0.83 04/24/2017   PSA 0.72 04/18/2016   PSA 0.59 03/21/2015    Assessment and Plan:   Healthcare maintenance  Doing okay, compliant, still has some weight issues. Ongoing back and neck issues.  Health Maintenance Exam: The patient's preventative maintenance and recommended screening tests for an annual wellness exam were reviewed in full today. Brought up to date unless services declined.  Counselled on the importance of diet, exercise, and its role in overall health and mortality. The patient's FH and SH was reviewed, including their home life, tobacco status, and drug and alcohol status.  Follow-up in 1 year for physical exam or additional follow-up below.  Follow-up: No Follow-up on file. Or follow-up in 1 year if not noted.  Meds ordered this encounter  Medications  . diazepam (VALIUM) 5 MG tablet    Sig: Take 1 tablet (5 mg total) by mouth every 8 (eight) hours as needed for anxiety.    Dispense:  30 tablet    Refill:  0   Medications  Discontinued During This Encounter  Medication Reason  . diazepam (VALIUM) 5 MG tablet Reorder    Signed,  Gunhild Bautch T. Jesslyn Viglione, MD   Allergies as of 04/29/2017      Reactions   Biaxin [clarithromycin]    Pt has history of arrhythmias   Penicillins       Medication List       Accurate as of 04/29/17  9:30 AM. Always use your most recent med list.          aspirin 325 MG tablet Take 325 mg by mouth daily.   diazepam 5 MG tablet Commonly known as:  VALIUM Take 1 tablet (5 mg total) by mouth every 8 (eight) hours as needed for anxiety.   diltiazem 180 MG 24 hr capsule Commonly known as:  TAZTIA XT Take 1 capsule (180 mg total) by mouth daily.   flecainide 100 MG tablet Commonly known as:  TAMBOCOR TAKE ONE TABLET BY MOUTH TWICE DAILY   hydrochlorothiazide 25 MG tablet Commonly known as:  HYDRODIURIL Take 1 tablet (25 mg total) by mouth daily.   omeprazole 40 MG capsule Commonly known as:  PRILOSEC Take 40 mg by mouth daily.

## 2017-05-19 ENCOUNTER — Encounter: Payer: Self-pay | Admitting: Family Medicine

## 2017-06-03 ENCOUNTER — Encounter: Payer: Self-pay | Admitting: Family Medicine

## 2017-06-04 ENCOUNTER — Ambulatory Visit (INDEPENDENT_AMBULATORY_CARE_PROVIDER_SITE_OTHER)
Admission: RE | Admit: 2017-06-04 | Discharge: 2017-06-04 | Disposition: A | Discharge status: Home / Self Care | Payer: BLUE CROSS/BLUE SHIELD | Source: Ambulatory Visit | Attending: Family Medicine | Admitting: Family Medicine

## 2017-06-04 ENCOUNTER — Encounter: Payer: Self-pay | Admitting: Family Medicine

## 2017-06-04 ENCOUNTER — Ambulatory Visit (INDEPENDENT_AMBULATORY_CARE_PROVIDER_SITE_OTHER): Payer: BLUE CROSS/BLUE SHIELD | Admitting: Family Medicine

## 2017-06-04 ENCOUNTER — Encounter: Payer: Self-pay | Admitting: *Deleted

## 2017-06-04 VITALS — BP 132/90 | HR 65 | Temp 98.4°F | Ht 72.25 in | Wt 328.5 lb

## 2017-06-04 DIAGNOSIS — M25562 Pain in left knee: Secondary | ICD-10-CM

## 2017-06-04 DIAGNOSIS — M2392 Unspecified internal derangement of left knee: Secondary | ICD-10-CM

## 2017-06-04 MED ORDER — METHYLPREDNISOLONE ACETATE 40 MG/ML IJ SUSP
80.0000 mg | Freq: Once | INTRAMUSCULAR | Status: AC
  Administered 2017-06-04: 80 mg via INTRA_ARTICULAR

## 2017-06-04 NOTE — Progress Notes (Signed)
Dr. Frederico Hamman T. Alexio Sroka, MD, [facility] [place], [address] Phone: [phone] Fax: [phone]  06/04/2017  Patient: Brandon [name], MRN: [id number], DOB: April 16, 1966, 51 y.o.  Primary Physician:  Owens Loffler, MD   Chief Complaint  Patient presents with  . Knee Pain    Left-hurt getting out of truck about 3 weeks ago   Subjective:   Brandon [name] is a 51 y.o. very pleasant male patient who presents with the following:  3-4 weeks ago, got out of his truck and did something to his truck, and yesterday popped again and got much worse. Woke him up last night. This is all on his left knee.  He has no known significant prior history of left knee pain.  No significant prior left knee traumas.  No history of left knee surgery.  He has had some occasional mechanical sensation of buckling.  No locking up.  It is much worse compared to the initial injury 3 or 4 weeks ago.  There is a mild effusion.  He has been icing his knee routinely with a Cryo/Cuff and wearing a brace.  Ice and a brace.   inj left knee  Past Medical History, Surgical History, Social History, Family History, Problem List, Medications, and Allergies have been reviewed and updated if relevant.  Patient Active Problem List   Diagnosis Date Noted  . Chest wall pain 05/13/2016  . GERD (gastroesophageal reflux disease) 05/13/2016  . Ventricular ectopy 12/14/2015  . Cervical disc disorder with radiculopathy of cervical region 10/05/2013  . Carpal tunnel syndrome 10/05/2013  . Essential hypertension 06/16/2013  . 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 Basal cell carcinoma 06/25/2011  . Morbid obesity with BMI of 40.0-44.9, adult ([facility]) 06/07/2009  . ATRIAL FIBRILLATION, PAROXYSMAL 06/07/2009  . DIVERTICULITIS, COLON 06/07/2009  . ARTHRITIS 06/07/2009    Past Medical History:  Diagnosis Date  . ARTHRITIS 06/07/2009  . Atrial fibrillation ([place])   .  ATRIAL FIBRILLATION, PAROXYSMAL 06/07/2009  . Cervical disc disorder with radiculopathy of cervical region 10/05/2013  . CHICKENPOX, HX OF 06/07/2009  . 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 DEPRESSION 06/07/2009  . DIVERTICULITIS, COLON 06/07/2009  . Hematuria     Past Surgical History:  Procedure Laterality Date  . CARDIAC CATHETERIZATION    . FOOT SURGERY     left foot  . TONSILLECTOMY      Social History   Social History  . Marital status: Single    Spouse name: N/A  . Number of children: N/A  . Years of education: N/A   Occupational History  . self employed [employer]   Social History Main Topics  . Smoking status: Never Smoker  . Smokeless tobacco: Current User    Types: Snuff    Last attempt to quit: 06/17/2011     Comment: occasionally  . Alcohol use 0.0 oz/week     Comment: occ  . Drug use: No  . Sexual activity: Not on file   Other Topics Concern  . Not on file   Social History Narrative   Does not do regular exercise. Former dip - quit about 1 year ago.     Family History  Problem Relation Age of Onset  . Stroke Father   . Arthritis Unknown   . Diabetes Unknown        1st degree relative  . Stroke Unknown        F 1st degree relative <60; M 1st  degree relative <50    Allergies  Allergen Reactions  . Biaxin [Clarithromycin]     Pt has history of arrhythmias  . Penicillins     Medication list reviewed and updated in full in [place].  GEN: No fevers, chills. Nontoxic. Primarily MSK c/o today. MSK: Detailed in the HPI GI: tolerating PO intake without difficulty Neuro: No numbness, parasthesias, or tingling associated. Otherwise the pertinent positives of the ROS are noted above.   Objective:   BP 132/90   Pulse 65   Temp 98.4 F (36.9 C) (Oral)   Ht 6' 0.25" (1.835 m)   Wt (!) 328 lb 8 oz (149 kg)   BMI 44.24 kg/m    GEN: WDWN, NAD, Non-toxic, Alert & Oriented x 3 HEENT: Atraumatic, Normocephalic.  Ears and Nose: No external  deformity. EXTR: No clubbing/cyanosis/edema NEURO: Normal gait.  PSYCH: Normally interactive. Conversant. Not depressed or anxious appearing.  Calm demeanor.   Knee:  L Gait: Normal heel toe pattern ROM: 0-105 Effusion: mild Echymosis or edema: none Patellar tendon NT Painful PLICA: neg Patellar grind: negative Medial and lateral patellar facet loading: negative medial and lateral joint lines: medial joint line pain Mcmurray's + for pain Flexion-pinch + Varus and valgus stress: stable Lachman: neg Ant and Post drawer: neg Hip abduction, IR, ER: WNL Hip flexion str: 5/5 Hip abd: 5/5 Quad: 5/5 VMO atrophy:No Hamstring concentric and eccentric: 5/5   Radiology: Dg Knee 4 Views W/patella Left  Result Date: 06/04/2017 CLINICAL DATA:  Left knee pain, injury 3 weeks ago. EXAM: LEFT KNEE - COMPLETE 4+ VIEW COMPARISON:  None. FINDINGS: Osseous alignment is normal. Bone mineralization is normal. No fracture line or displaced fracture fragment seen. No significant degenerative change seen. No appreciable joint effusion and adjacent soft tissues are unremarkable. IMPRESSION: Negative. Electronically Signed   By: Franki Cabot M.D.   On: 06/04/2017 11:57     Assessment and Plan:   Acute pain of left knee - Plan: DG Knee 4 Views W/Patella Left, methylPREDNISolone acetate (DEPO-MEDROL) injection 80 mg  Internal derangement of knee joint, left  Internal derangement left knee, more likely meniscal, cannot rule out other pathology.  At this point he would like to proceed with a conservative trial, continue with bracing, icing, home rehabilitation.  Intra-articular injection in addition  Knee Injection, L Patient verbally consented to procedure. Risks (including potential rare risk of infection), benefits, and alternatives explained. Sterilely prepped with Chloraprep. Ethyl cholride used for anesthesia. 8 cc Lidocaine 1% mixed with 2 mL Depo-Medrol 40 mg injected using the anteromedial  approach without difficulty. No complications with procedure and tolerated well. Patient had decreased pain post-injection.   Follow-up: Return in about 1 month (around 07/05/2017).  Future Appointments Date Time Provider [place]  07/08/2017 8:15 AM Liliani Bobo, Frederico Hamman, MD [id number]    Meds ordered this encounter  Medications  . methylPREDNISolone acetate (DEPO-MEDROL) injection 80 mg   There are no discontinued medications. Orders Placed This Encounter  Procedures  . DG Knee 4 Views W/Patella Left    Signed,  Ngoc Detjen T. Pershing Skidmore, MD   Allergies as of 06/04/2017      Reactions   Biaxin [clarithromycin]    Pt has history of arrhythmias   Penicillins       Medication List       Accurate as of 06/04/17 11:59 PM. Always use your most recent med list.          aspirin 325 MG tablet Take 325  mg by mouth daily.   diazepam 5 MG tablet Commonly known as:  VALIUM Take 1 tablet (5 mg total) by mouth every 8 (eight) hours as needed for anxiety.   diltiazem 180 MG 24 hr capsule Commonly known as:  TAZTIA XT Take 1 capsule (180 mg total) by mouth daily.   flecainide 100 MG tablet Commonly known as:  TAMBOCOR TAKE ONE TABLET BY MOUTH TWICE DAILY   hydrochlorothiazide 25 MG tablet Commonly known as:  HYDRODIURIL Take 1 tablet (25 mg total) by mouth daily.   omeprazole 40 MG capsule Commonly known as:  PRILOSEC Take 40 mg by mouth daily.

## 2017-07-08 ENCOUNTER — Ambulatory Visit: Payer: BLUE CROSS/BLUE SHIELD | Admitting: Family Medicine

## 2017-07-15 ENCOUNTER — Other Ambulatory Visit: Payer: Self-pay | Admitting: *Deleted

## 2017-07-15 MED ORDER — DILTIAZEM HCL ER BEADS 180 MG PO CP24: 180.0000 mg | ORAL_CAPSULE | Freq: Every day | ORAL | 3 refills | Status: DC

## 2017-08-06 ENCOUNTER — Other Ambulatory Visit: Payer: Self-pay

## 2017-08-06 MED ORDER — FLECAINIDE ACETATE 100 MG PO TABS: 100.0000 mg | ORAL_TABLET | Freq: Two times a day (BID) | ORAL | 3 refills | Status: DC

## 2017-10-08 ENCOUNTER — Ambulatory Visit (INDEPENDENT_AMBULATORY_CARE_PROVIDER_SITE_OTHER): Payer: BLUE CROSS/BLUE SHIELD

## 2017-10-08 ENCOUNTER — Other Ambulatory Visit: Payer: Self-pay

## 2017-10-08 DIAGNOSIS — Z23 Encounter for immunization: Secondary | ICD-10-CM

## 2017-10-08 MED ORDER — HYDROCHLOROTHIAZIDE 25 MG PO TABS: 25.0000 mg | ORAL_TABLET | ORAL | 6 refills | Status: DC

## 2017-10-14 IMAGING — CT CT CERVICAL SPINE W/O CM
3 of 4 series · 12 of 33 positions shown, 14 images · non-contrast
Comparison: C-spine MRI dated 04/24/2013

CLINICAL DATA: 50-year-old male with fall and neck pain.

EXAM:
CT CERVICAL SPINE WITHOUT CONTRAST
TECHNIQUE: Multidetector CT imaging of the cervical spine was performed without
intravenous contrast. Multiplanar CT image reconstructions were also
generated.

[Series 4: sagittal bone · sagittal · 0.27mm/px · 5 of 46 slices shown, 6 images]
[im 16/46  bone]
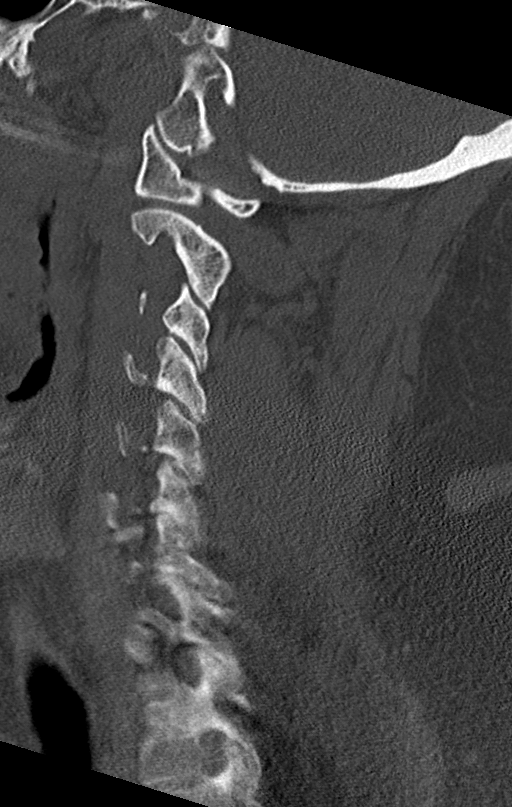
[im 19/46  bone]
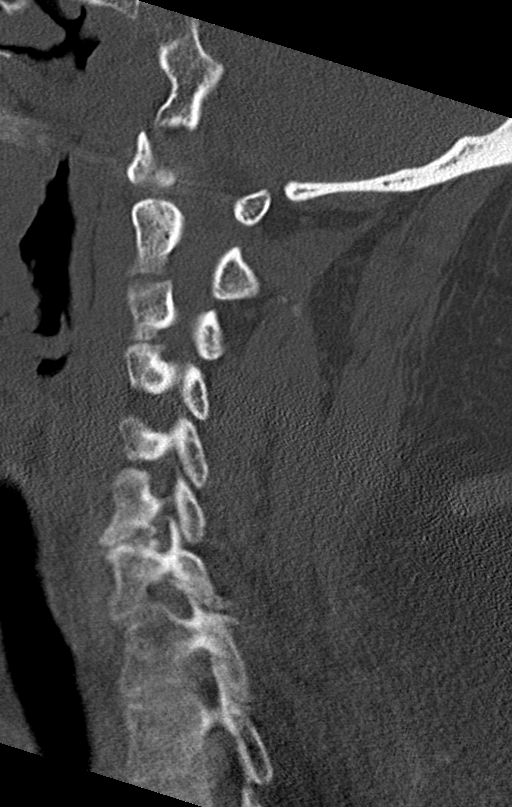
[im 23/46  soft-tissue]
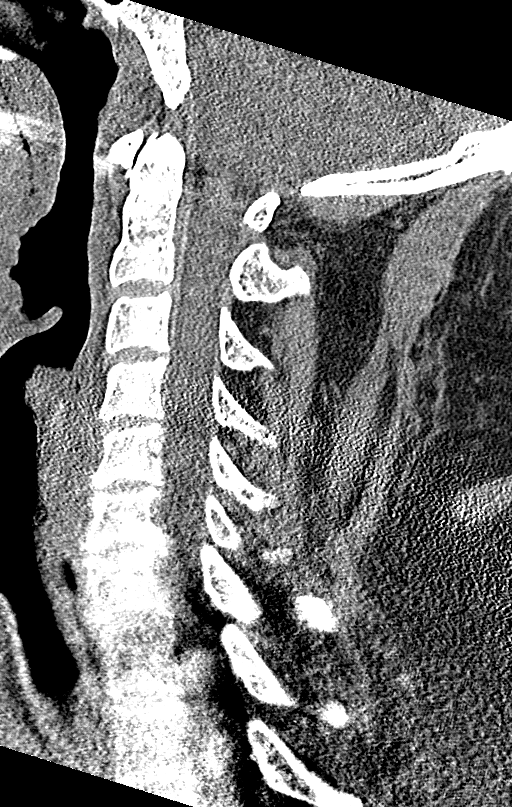
[im 23/46  bone]
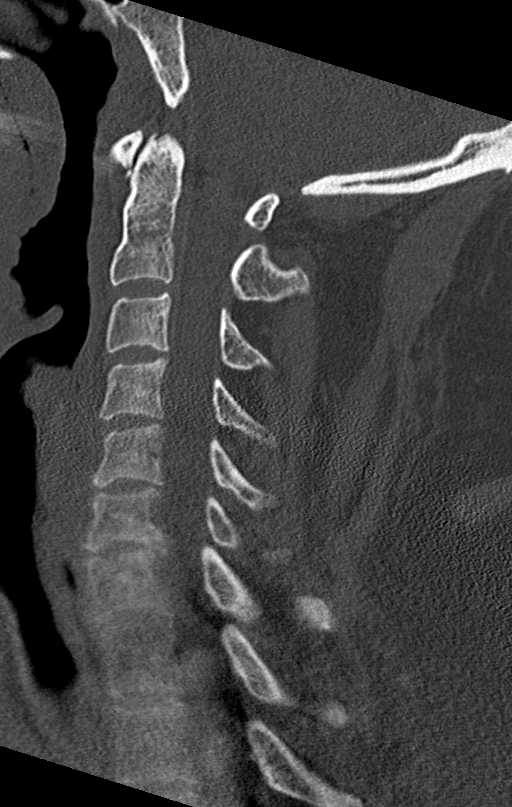
[im 27/46  bone]
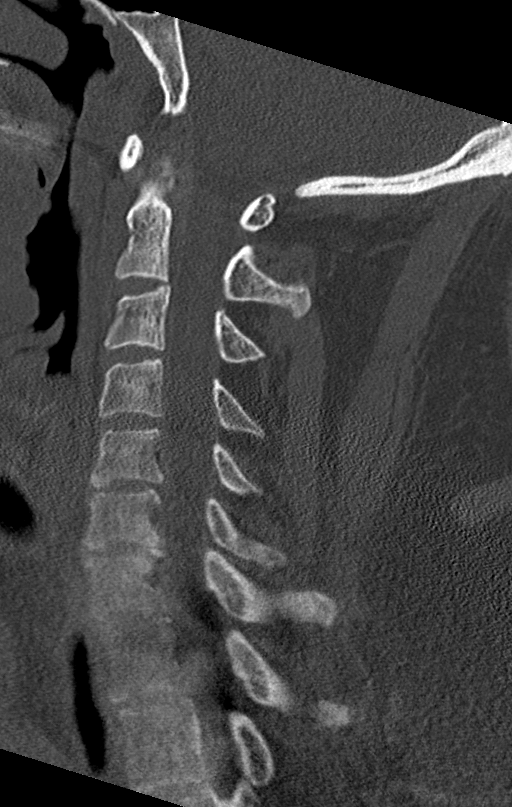
[im 31/46  bone]
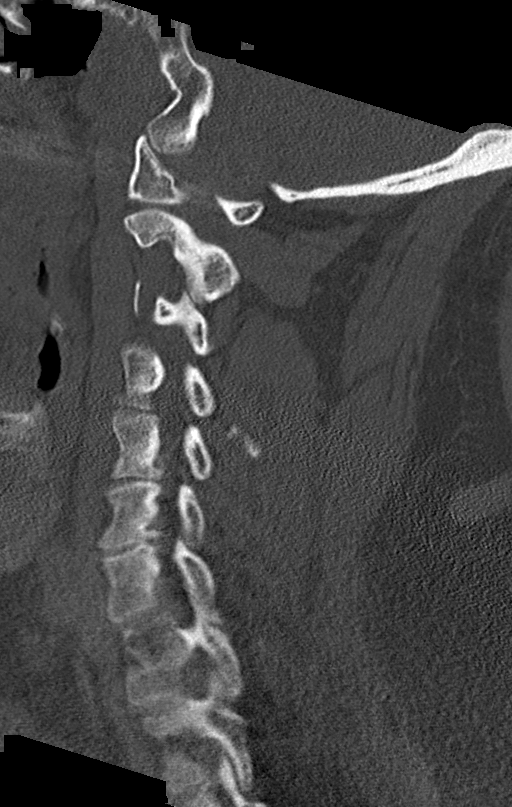

[Series 5: coronal bone · coronal · 0.29mm/px · 3 of 47 slices shown]
[im 10/47  bone]
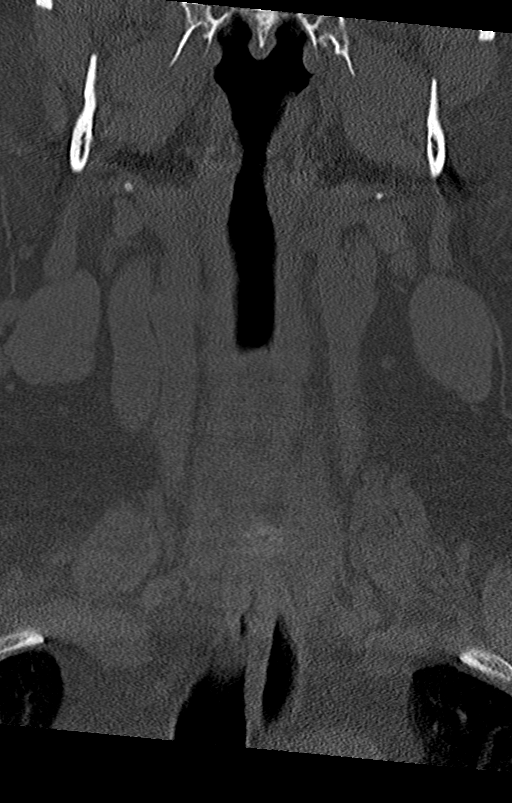
[im 19/47  bone]
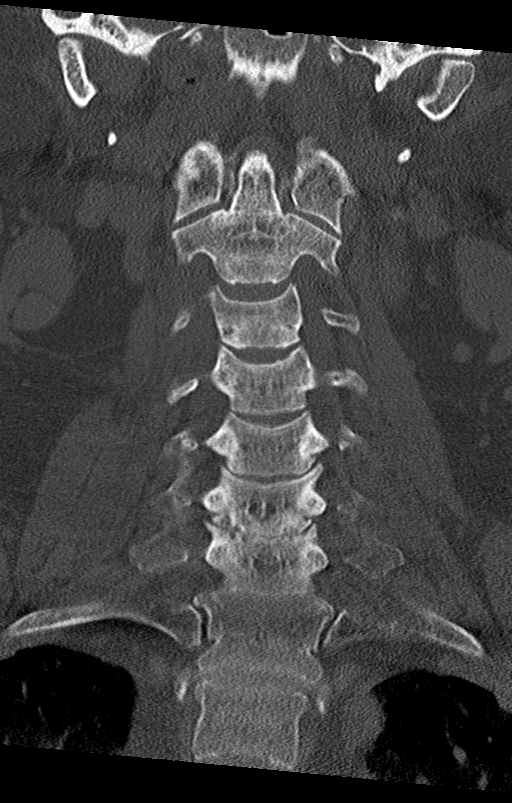
[im 28/47  bone]
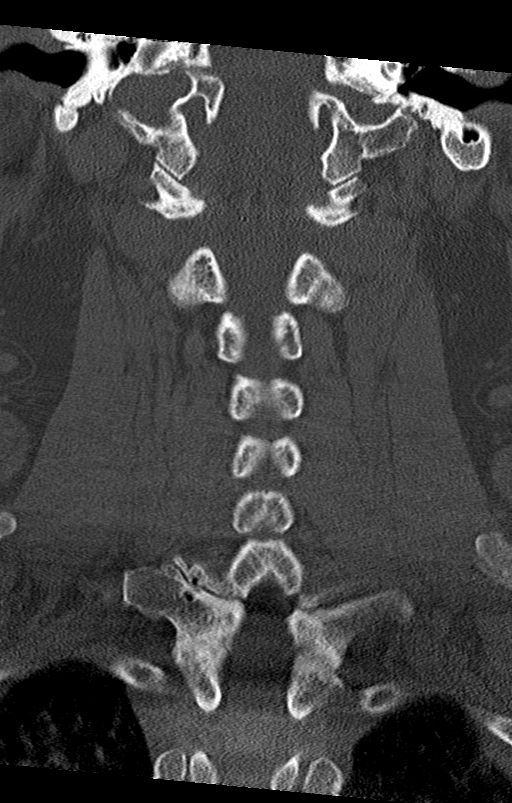

[Series 6: orthogonal bone · axial · 0.19mm/px · z∈[-21,+121]mm · 4 of 113 slices shown, 5 images]
[im 19/113  soft-tissue]
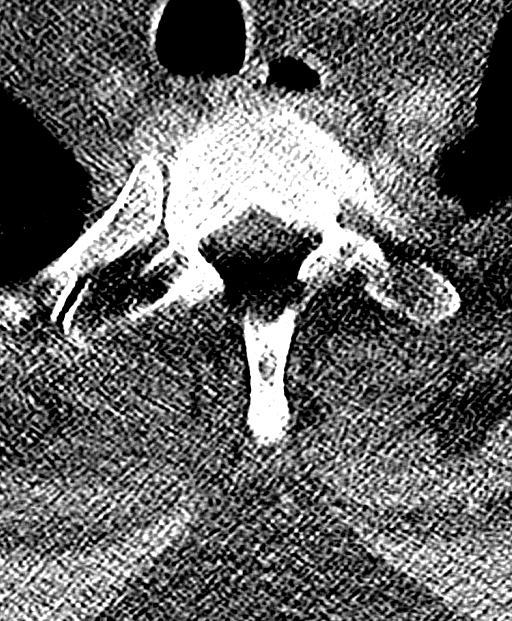
[im 19/113  bone]
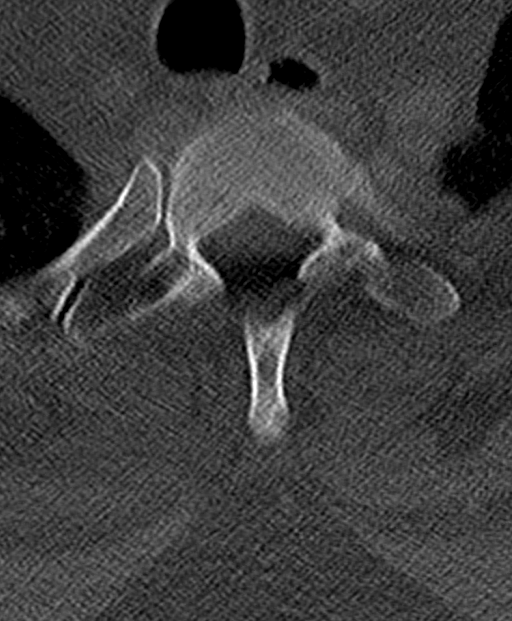
[im 38/113  bone]
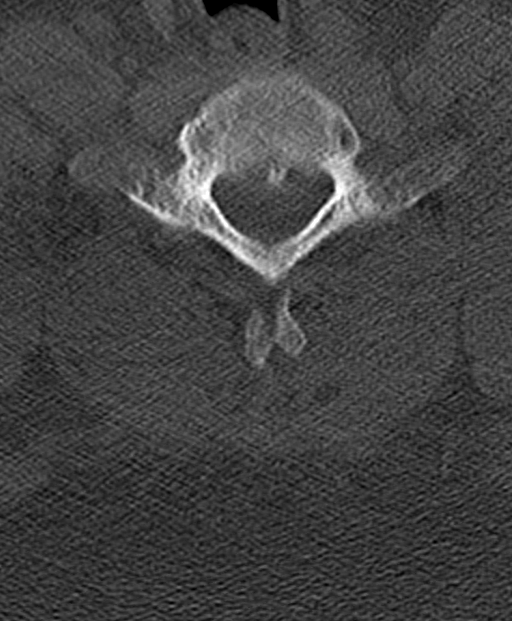
[im 75/113  bone]
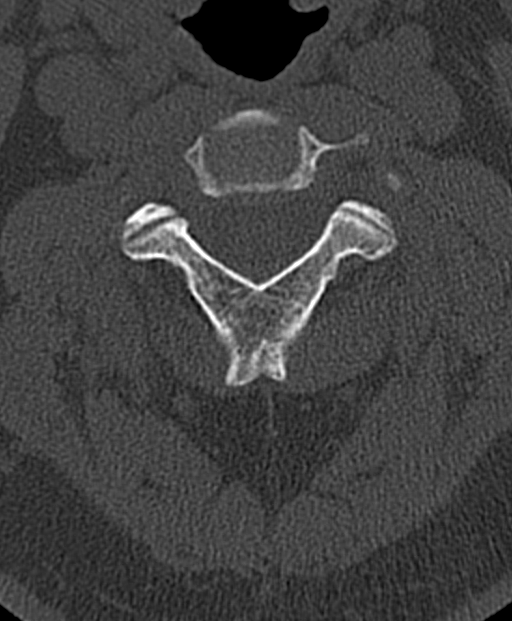
[im 94/113  bone]
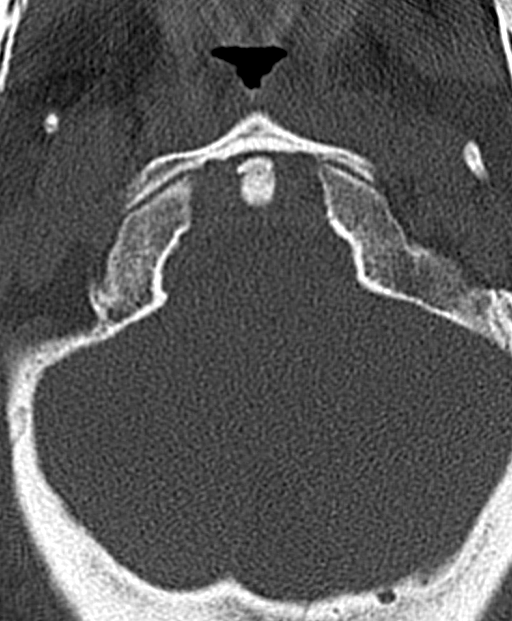

[12 of 33 positions shown; findings below may reference images not displayed]

FINDINGS: Alignment: Normal.

Skull base and vertebrae: No acute fracture. No primary bone lesion
or focal pathologic process.

Soft tissues and spinal canal: No prevertebral fluid or swelling. No
visible canal hematoma.

Upper chest: Negative.

Other: A 1 x 1 cm focal heterogeneity in versus nodule in the left
thyroid lobe. Ultrasound may provide better evaluation.
IMPRESSION: No acute/ traumatic cervical spine pathology.

## 2017-10-14 IMAGING — CT CT L SPINE W/O CM
3 of 10 series · 12 of 33 positions shown, 14 images · non-contrast
Comparison: None.

CLINICAL DATA: Status post slip and fall today with a low back
injury. Pain. Initial encounter.

EXAM:
CT LUMBAR SPINE WITHOUT CONTRAST
TECHNIQUE: Multidetector CT imaging of the lumbar spine was performed without
intravenous contrast administration. Multiplanar CT image
reconstructions were also generated.

[Series 3: l spine soft · axial · 0.37mm/px · z∈[-448,-286]mm · 4 of 123 slices shown, 5 images]
[im 21/123  soft-tissue]
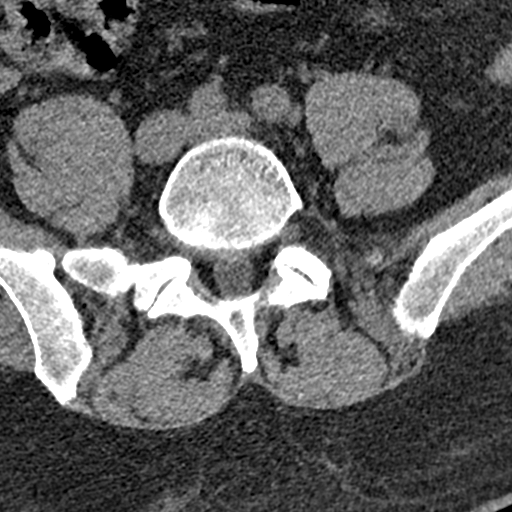
[im 21/123  bone]
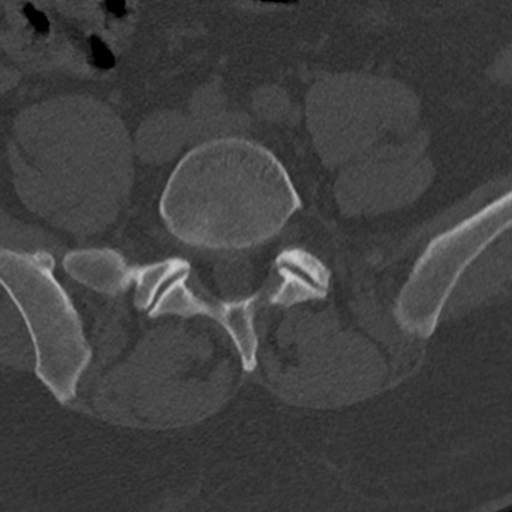
[im 41/123  bone]
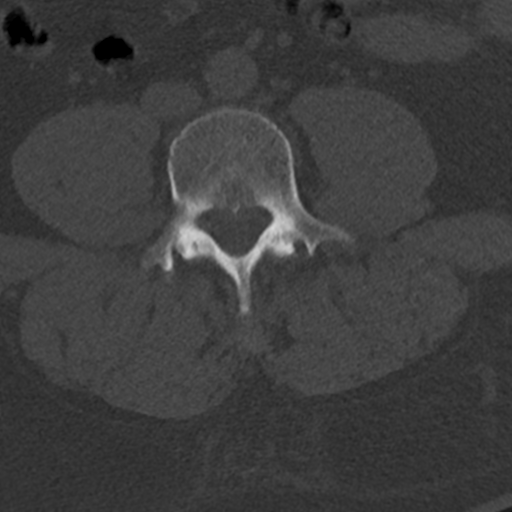
[im 82/123  bone]
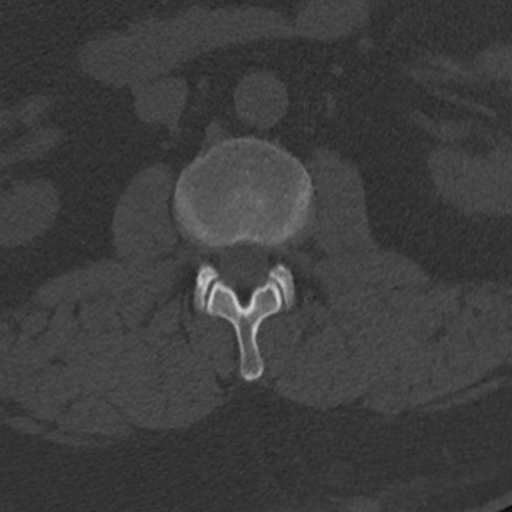
[im 102/123  bone]
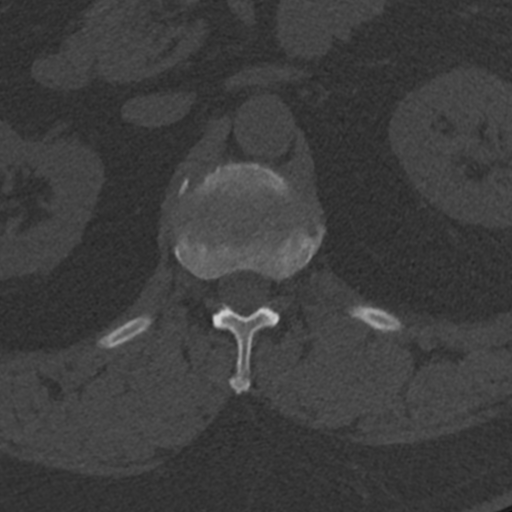

[Series 4: sagittal bone · sagittal · 0.36mm/px · 5 of 79 slices shown, 6 images]
[im 27/79  bone]
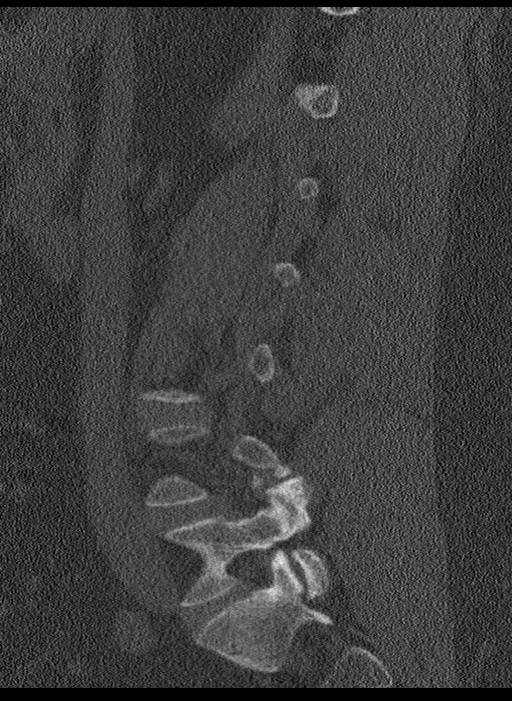
[im 33/79  bone]
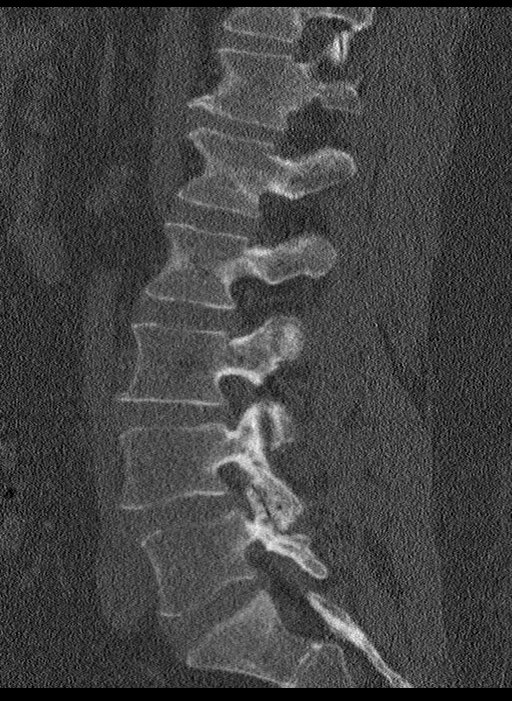
[im 40/79  soft-tissue]
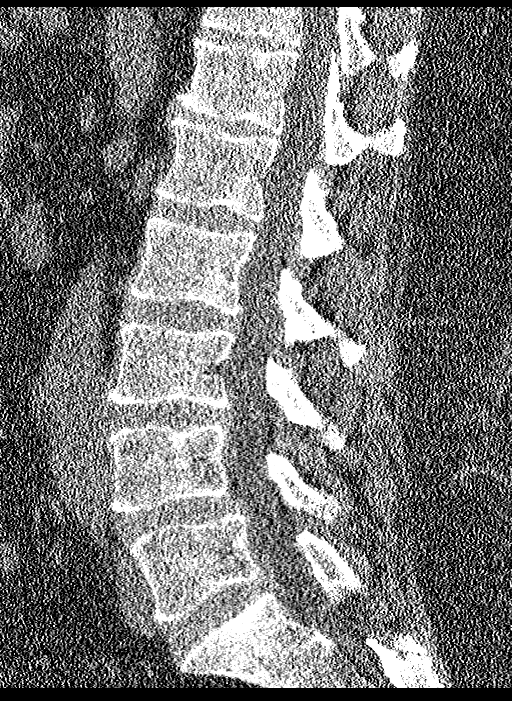
[im 40/79  bone]
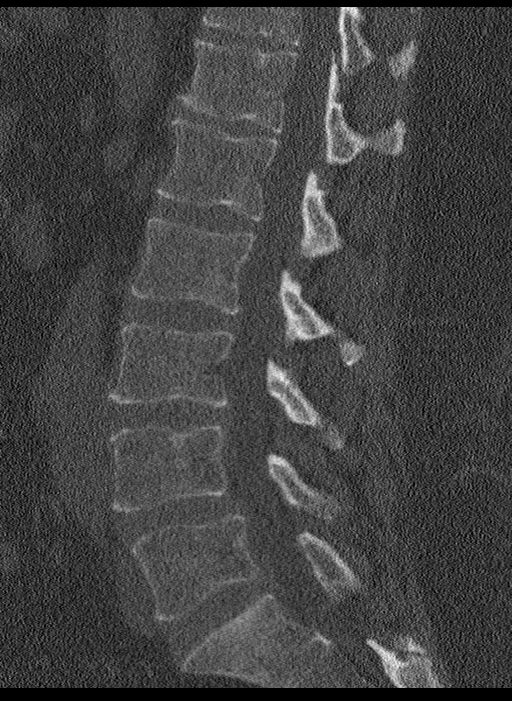
[im 46/79  bone]
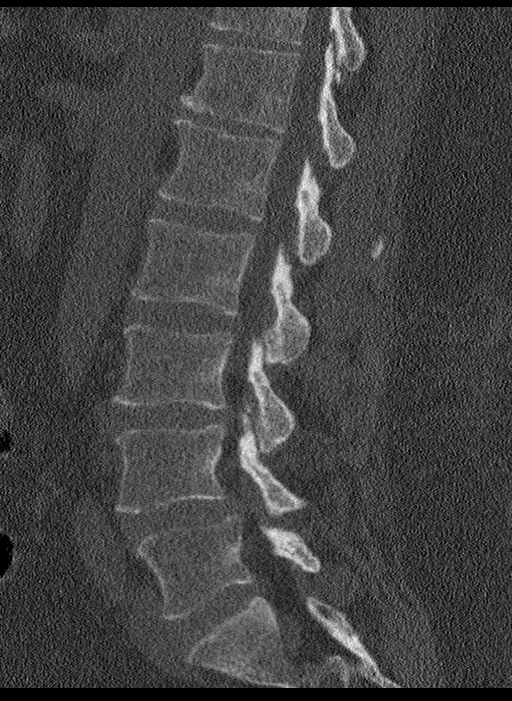
[im 53/79  bone]
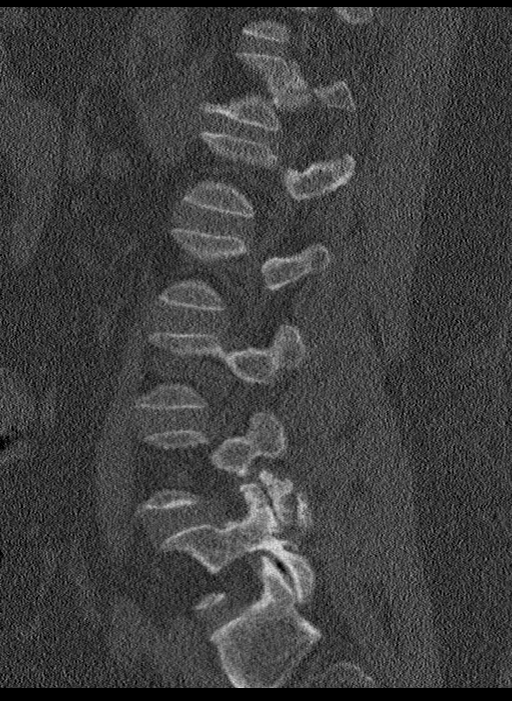

[Series 5: coronal bone · coronal · 0.36mm/px · 3 of 68 slices shown]
[im 14/68  bone]
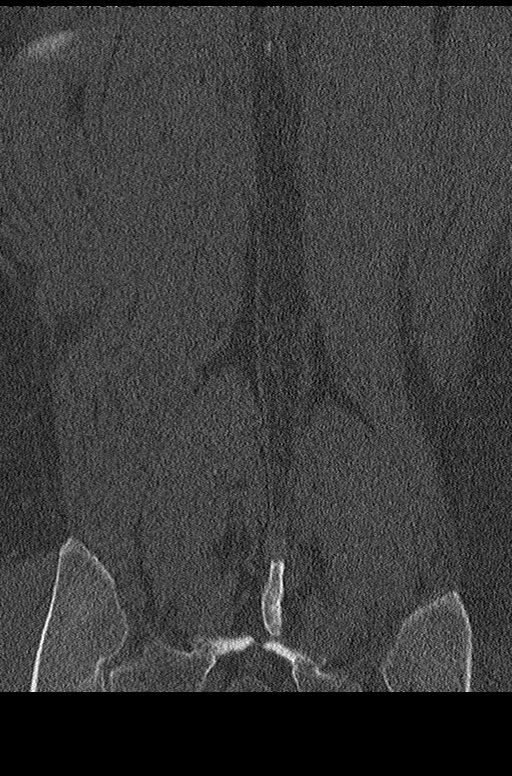
[im 27/68  bone]
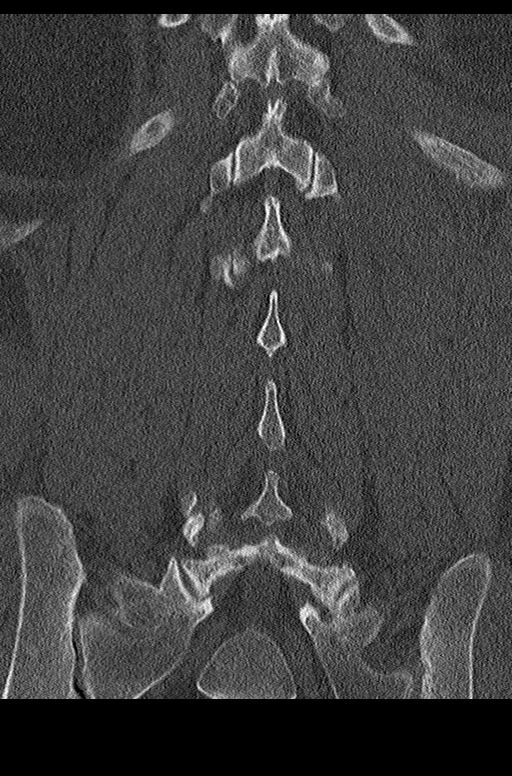
[im 41/68  bone]
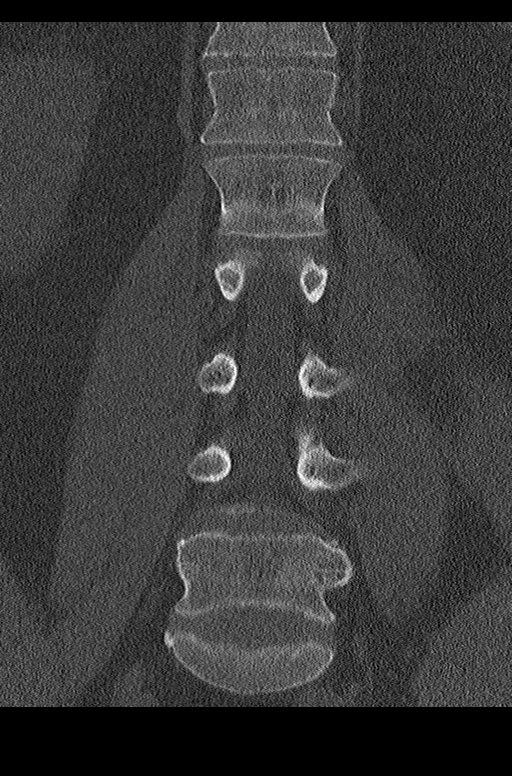

[12 of 33 positions shown; findings below may reference images not displayed]

FINDINGS: Segmentation: Unremarkable.

Alignment: 0.5 cm anterolisthesis L4 on L5 is due to facet
arthropathy.

Vertebrae: There are acute left transverse process fractures of L1
and L2. There may be a right transverse process fracture L3. No
other fracture is identified. 0.4 cm in diameter sclerotic lesion in
the L3 vertebral body is compatible with a bone island and unchanged
since the prior CT.

Paraspinal and other soft tissues: Left adrenal adenoma is
unchanged.

Disc levels: T11-12 is imaged in the sagittal plane only and
negative.

T12-L1: Facet arthropathy is worse on the left. This level is
otherwise negative.

L1-2:  Facet degenerative change.  Otherwise negative.

L2-3: Minimal disc bulge without central canal or foraminal
stenosis.

L3-4: Minimal disc bulge and mild facet degenerative change without
central canal or foraminal stenosis.

L4-5: Advanced bilateral facet degenerative change. The disc is
uncovered without bulging. The central canal and foramina are open.

L5-S1: Negative.
IMPRESSION: Acute left transverse process fractures L1 and L2. There may be a
nondisplaced right transverse process fracture of L3. No other acute
abnormality is identified.

Negative for central canal stenosis. Facet arthropathy at L4-5
results in 0.5 cm anterolisthesis.

## 2017-10-14 IMAGING — CR DG THORACIC SPINE 2V
1 series · 3 of 3 positions shown · non-contrast
Comparison: None.

CLINICAL DATA: Status post fall today with a blow to the back.
Thoracic spine pain. Initial encounter.

EXAM:
THORACIC SPINE 2 VIEWS

[Series 1: dg thoracic spine 2 view · 0.14mm/px · 3 of 3 slices shown]
[im 1/3]
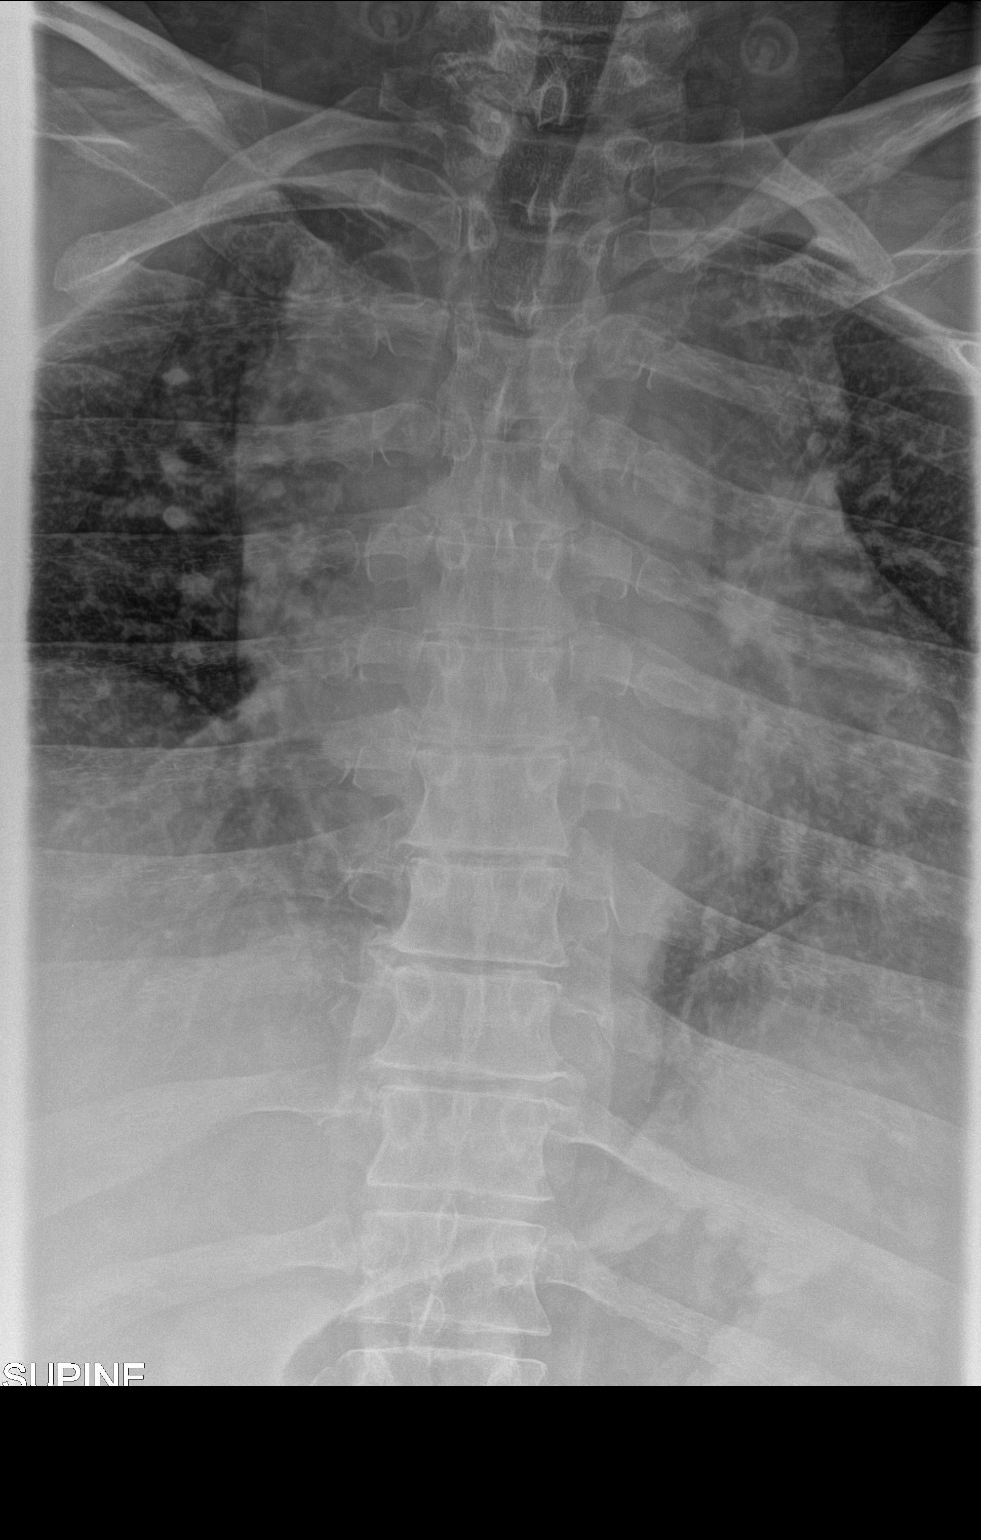
[im 2/3]
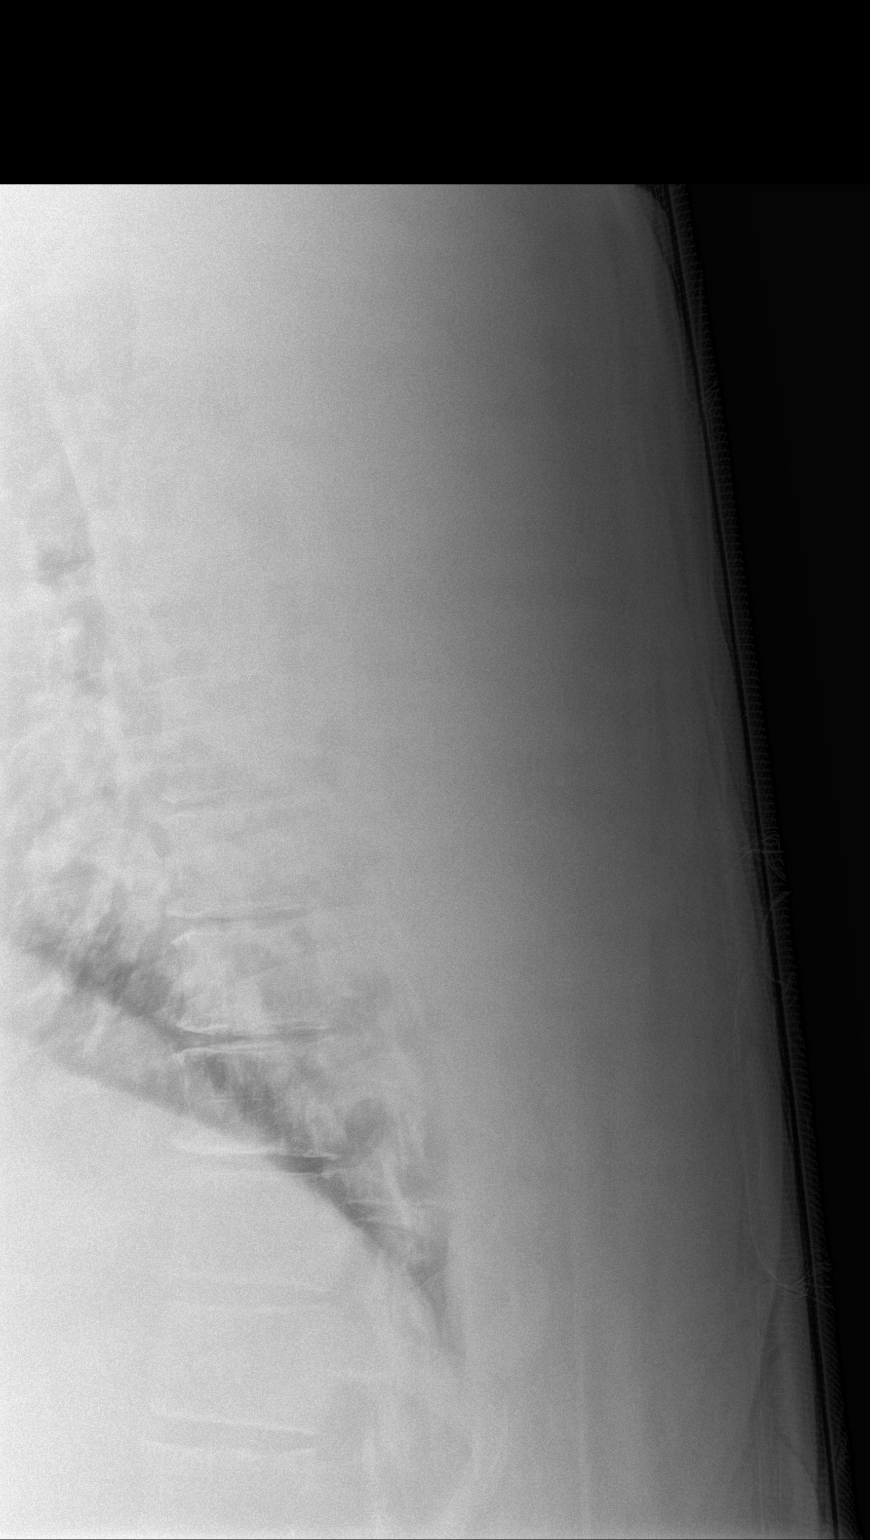
[im 3/3]
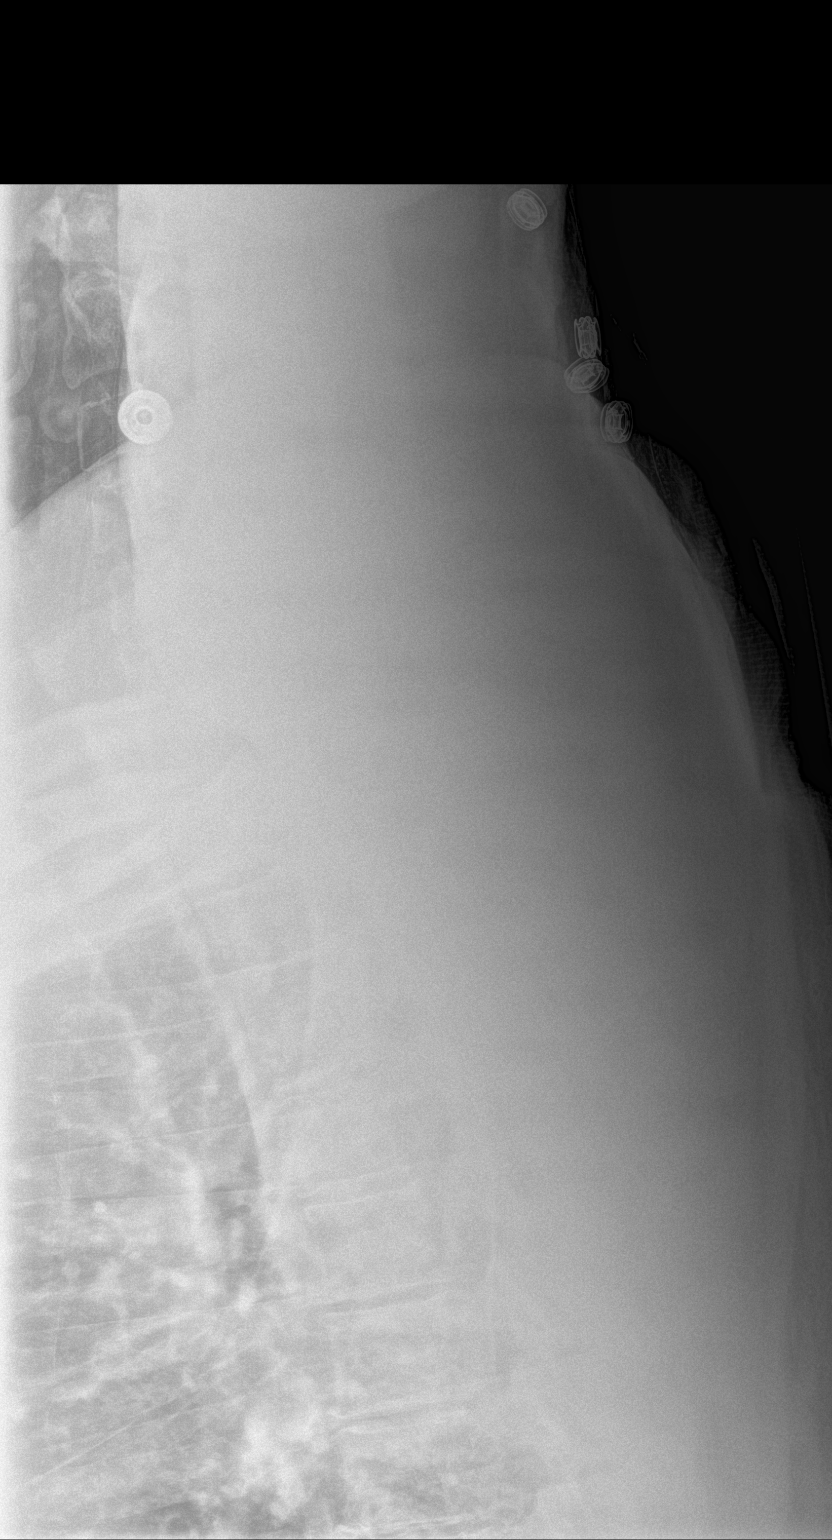

[3 of 3 positions shown; findings below may reference images not displayed]

FINDINGS: There is no evidence of thoracic spine fracture. Alignment is
normal. No other significant bone abnormalities are identified.
IMPRESSION: Negative exam.

## 2017-11-23 ENCOUNTER — Ambulatory Visit (INDEPENDENT_AMBULATORY_CARE_PROVIDER_SITE_OTHER)
Admission: RE | Admit: 2017-11-23 | Discharge: 2017-11-23 | Disposition: A | Discharge status: Home / Self Care | Payer: BLUE CROSS/BLUE SHIELD | Source: Ambulatory Visit | Attending: Family Medicine | Admitting: Family Medicine

## 2017-11-23 ENCOUNTER — Encounter: Payer: Self-pay | Admitting: Family Medicine

## 2017-11-23 ENCOUNTER — Other Ambulatory Visit: Payer: Self-pay

## 2017-11-23 ENCOUNTER — Ambulatory Visit: Payer: BLUE CROSS/BLUE SHIELD | Admitting: Family Medicine

## 2017-11-23 VITALS — BP 126/90 | HR 66 | Temp 98.3°F | Ht 72.25 in | Wt 332.5 lb

## 2017-11-23 DIAGNOSIS — S6992XA Unspecified injury of left wrist, hand and finger(s), initial encounter: Secondary | ICD-10-CM

## 2017-11-23 NOTE — Progress Notes (Signed)
Dr. Frederico Hamman T. Salah Nakamura, MD, [facility] [place], [address] Phone: [phone] Fax: [phone]  11/23/2017  Patient: Brandon [name], MRN: [id number], DOB: 10/04/1966, 52 y.o.  Primary Physician:  Owens Loffler, MD   Chief Complaint  Patient presents with  . Hand Injury    Left-Hit with hammer back in December   Subjective:   Brandon [name] is a 52 y.o. very pleasant male patient who presents with the following:  The patient works as a Engineer, building services, and he hit his hand with a 4 pound hammer in December.  He is not entirely sure exactly when this was, and it may have been as long as 6 weeks ago.  He is still having some pain at the base of his hand and wrist wanted to make sure that he had broken anything.  He is having some problems manipulating at work.  Past Medical History, Surgical History, Social History, Family History, Problem List, Medications, and Allergies have been reviewed and updated if relevant.  Patient Active Problem List   Diagnosis Date Noted  . Chest wall pain 05/13/2016  . GERD (gastroesophageal reflux disease) 05/13/2016  . Ventricular ectopy 12/14/2015  . Cervical disc disorder with radiculopathy of cervical region 10/05/2013  . Carpal tunnel syndrome 10/05/2013  . Essential hypertension 06/16/2013  . 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 Basal cell carcinoma 06/25/2011  . Morbid obesity with BMI of 40.0-44.9, adult ([place]) 06/07/2009  . ATRIAL FIBRILLATION, PAROXYSMAL 06/07/2009  . DIVERTICULITIS, COLON 06/07/2009  . ARTHRITIS 06/07/2009    Past Medical History:  Diagnosis Date  . ARTHRITIS 06/07/2009  . Atrial fibrillation ([place])   . ATRIAL FIBRILLATION, PAROXYSMAL 06/07/2009  . Cervical disc disorder with radiculopathy of cervical region 10/05/2013  . CHICKENPOX, HX OF 06/07/2009  . 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 DEPRESSION 06/07/2009  . DIVERTICULITIS, COLON 06/07/2009  . Hematuria       Past Surgical History:  Procedure Laterality Date  . CARDIAC CATHETERIZATION    . FOOT SURGERY     left foot  . TONSILLECTOMY      Social History   Socioeconomic History  . Marital status: Single    Spouse name: Not on file  . Number of children: Not on file  . Years of education: Not on file  . Highest education level: Not on file  Social Needs  . Financial resource strain: Not on file  . Food insecurity - worry: Not on file  . Food insecurity - inability: Not on file  . Transportation needs - medical: Not on file  . Transportation needs - non-medical: Not on file  Occupational History  . Occupation: self employed    Fish farm manager: [employer]  Tobacco Use  . Smoking status: Never Smoker  . Smokeless tobacco: Current User    Types: Snuff  . Tobacco comment: occasionally  Substance and Sexual Activity  . Alcohol use: Yes    Alcohol/week: 0.0 oz    Comment: occ  . Drug use: No  . Sexual activity: Not on file  Other Topics Concern  . Not on file  Social History Narrative   Does not do regular exercise. Former dip - quit about 1 year ago.     Family History  Problem Relation Age of Onset  . Stroke Father   . Arthritis Unknown   . Diabetes Unknown        1st degree relative  . Stroke Unknown  F 1st degree relative <60; M 1st degree relative <50    Allergies  Allergen Reactions  . Biaxin [Clarithromycin]     Pt has history of arrhythmias  . Penicillins     Medication list reviewed and updated in full in [facility].  GEN: No fevers, chills. Nontoxic. Primarily MSK c/o today. MSK: Detailed in the HPI GI: tolerating PO intake without difficulty Neuro: No numbness, parasthesias, or tingling associated. Otherwise the pertinent positives of the ROS are noted above.   Objective:   BP 126/90   Pulse 66   Temp 98.3 F (36.8 C) (Oral)   Ht 6' 0.25" (1.835 m)   Wt (!) 332 lb 8 oz (150.8 kg)   BMI 44.78 kg/m    GEN: WDWN, NAD, Non-toxic, Alert  & Oriented x 3 HEENT: Atraumatic, Normocephalic.  Ears and Nose: No external deformity. EXTR: No clubbing/cyanosis/edema NEURO: Normal gait.  PSYCH: Normally interactive. Conversant. Not depressed or anxious appearing.  Calm demeanor.   Hand: L Ecchymosis or edema: neg ROM wrist/hand/digits/elbow: ulnar deviation causes pain Carpals, MCP's, digits: NT Distal Ulna and Radius: NT Supination lift test: neg Ecchymosis or edema: neg Cysts/nodules: neg Finkelstein's test: neg Snuffbox tenderness: neg Scaphoid tubercle: NT Hook of Hamate: NT Resisted supination: some pain Full composite fist Grip, all digits: 5/5 str No tenosynovitis Axial load test: neg Phalen's: neg Tinel's: neg Atrophy: neg  Hand sensation: intact   Radiology: Dg Wrist Complete Left  Result Date: 11/23/2017 CLINICAL DATA:  Hit left wrist with a hammer. EXAM: LEFT WRIST - COMPLETE 3+ VIEW COMPARISON:  None. FINDINGS: No acute fracture or malalignment. Well corticated ossific densities at the tip of the ulnar styloid are likely due to prior trauma or represent accessory ossicles. Mild ulnar minus variance. Mild degenerative changes of the thumb MCP joint. Bone mineralization is normal. Two punctate radiopaque densities are noted in the soft tissues, one at the radial aspect of the first proximal phalanx, and the other near the proximal fourth metacarpal diaphysis. IMPRESSION: 1.  No acute osseous abnormality. 2. Two punctate radiopaque densities within the soft tissues of the hand, as described above. Correlate with clinical history. Electronically Signed   By: Titus Dubin M.D.   On: 11/23/2017 10:20    Assessment and Plan:   Wrist injury, left, initial encounter - Plan: DG Wrist Complete Left  Approximately 4 weeks out, no fracture.  Him and place him in a thumb spica splint to use ideally for part of the time during the work day, and to use when he is sleeping at nighttime to help mobilize his wrist for a few  weeks.  Aside from this continue to ice and work on basic range of motion.  Follow-up: No Follow-up on file.  Orders Placed This Encounter  Procedures  . DG Wrist Complete Left    Signed,  Makana Feigel T. Eniyah Eastmond, MD   Allergies as of 11/23/2017      Reactions   Biaxin [clarithromycin]    Pt has history of arrhythmias   Penicillins       Medication List        Accurate as of 11/23/17  2:11 PM. Always use your most recent med list.          aspirin 325 MG tablet Take 325 mg by mouth daily.   diazepam 5 MG tablet Commonly known as:  VALIUM Take 1 tablet (5 mg total) by mouth every 8 (eight) hours as needed for anxiety.   diltiazem 180  MG 24 hr capsule Commonly known as:  TAZTIA XT Take 1 capsule (180 mg total) by mouth daily.   flecainide 100 MG tablet Commonly known as:  TAMBOCOR Take 1 tablet (100 mg total) by mouth 2 (two) times daily.   hydrochlorothiazide 25 MG tablet Commonly known as:  HYDRODIURIL Take 1 tablet (25 mg total) by mouth every other day.   omeprazole 40 MG capsule Commonly known as:  PRILOSEC Take 40 mg by mouth every other day.

## 2017-12-15 ENCOUNTER — Encounter: Payer: Self-pay | Admitting: Cardiovascular Disease

## 2017-12-15 ENCOUNTER — Telehealth: Payer: Self-pay | Admitting: Cardiovascular Disease

## 2017-12-15 NOTE — Telephone Encounter (Signed)
Spoke with patient and he reports that his blood pressures is elevated. When asking him about the blood pressure readings he handed the phone over to [place] who identified herself as a nurse who was assisting patient today with care. She reports that blood pressures was 150/90 which is a manual reading. Recommended that he start keeping log of his readings so that we can see how his blood pressures trend. Discussed that at last visit they changed his HCTZ to every other day due to low blood pressure readings. So recommended that he monitor pressures especially with position changes as this was why medication changes were needed. She verbalized understanding of our conversation and had no further questions at this time.

## 2018-04-07 ENCOUNTER — Ambulatory Visit: Payer: BLUE CROSS/BLUE SHIELD | Admitting: Family Medicine

## 2018-04-07 ENCOUNTER — Encounter: Payer: Self-pay | Admitting: Family Medicine

## 2018-04-07 ENCOUNTER — Other Ambulatory Visit: Payer: Self-pay

## 2018-04-07 VITALS — BP 130/80 | HR 67 | Temp 97.8°F | Ht 72.25 in | Wt 319.8 lb

## 2018-04-07 DIAGNOSIS — R197 Diarrhea, unspecified: Secondary | ICD-10-CM | POA: Diagnosis not present

## 2018-04-07 NOTE — Progress Notes (Signed)
Dr. Frederico Hamman T. Sabre Leonetti, MD, [facility] [place], [address] Phone: [phone] Fax: [phone]  04/07/2018  Patient: Brandon [name], MRN: [id number], DOB: 12/13/1965, 52 y.o.  Primary Physician:  Owens Loffler, MD   Chief Complaint  Patient presents with  . Diarrhea    x 1 week   Subjective:   Brandon [name] is a 52 y.o. very pleasant male patient who presents with the following:  1 week of diarrhea. Loose stools. Brother had a similar thing one month ago. Does not feel bad. Lower part of stomach will hurt some. A lot of gas and loose stool. Took some pepto and that did not - took some mag citrate.   Eating and drinking normally.   Beet a leet?  Past Medical History, Surgical History, Social History, Family History, Problem List, Medications, and Allergies have been reviewed and updated if relevant.  Patient Active Problem List   Diagnosis Date Noted  . Chest wall pain 05/13/2016  . GERD (gastroesophageal reflux disease) 05/13/2016  . Ventricular ectopy 12/14/2015  . Cervical disc disorder with radiculopathy of cervical region 10/05/2013  . Carpal tunnel syndrome 10/05/2013  . Essential hypertension 06/16/2013  . 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 Basal cell carcinoma 06/25/2011  . Morbid obesity with BMI of 40.0-44.9, adult ([place]) 06/07/2009  . ATRIAL FIBRILLATION, PAROXYSMAL 06/07/2009  . DIVERTICULITIS, COLON 06/07/2009  . ARTHRITIS 06/07/2009    Past Medical History:  Diagnosis Date  . ARTHRITIS 06/07/2009  . Atrial fibrillation ([place])   . ATRIAL FIBRILLATION, PAROXYSMAL 06/07/2009  . Cervical disc disorder with radiculopathy of cervical region 10/05/2013  . CHICKENPOX, HX OF 06/07/2009  . 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 DEPRESSION 06/07/2009  . DIVERTICULITIS, COLON 06/07/2009  . Hematuria     Past Surgical History:  Procedure Laterality Date  . CARDIAC CATHETERIZATION    . FOOT SURGERY     left foot  . TONSILLECTOMY      Social History   Socioeconomic History  . Marital status: Single    Spouse name: Not on file  . Number of children: Not on file  . Years of education: Not on file  . Highest education level: Not on file  Occupational History  . Occupation: self employed    Fish farm manager: [employer]  Social Needs  . Financial resource strain: Not on file  . Food insecurity:    Worry: Not on file    Inability: Not on file  . Transportation needs:    Medical: Not on file    Non-medical: Not on file  Tobacco Use  . Smoking status: Never Smoker  . Smokeless tobacco: Current User    Types: Snuff  . Tobacco comment: occasionally  Substance and Sexual Activity  . Alcohol use: Yes    Alcohol/week: 0.0 oz    Comment: occ  . Drug use: No  . Sexual activity: Not on file  Lifestyle  . Physical activity:    Days per week: Not on file    Minutes per session: Not on file  . Stress: Not on file  Relationships  . Social connections:    Talks on phone: Not on file    Gets together: Not on file    Attends religious service: Not on file    Active member of club or organization: Not on file    Attends meetings of clubs or organizations: Not on file    Relationship status: Not on file  .  Intimate partner violence:    Fear of current or ex partner: Not on file    Emotionally abused: Not on file    Physically abused: Not on file    Forced sexual activity: Not on file  Other Topics Concern  . Not on file  Social History Narrative   Does not do regular exercise. Former dip - quit about 1 year ago.     Family History  Problem Relation Age of Onset  . Stroke Father   . Arthritis Unknown   . Diabetes Unknown        1st degree relative  . Stroke Unknown        F 1st degree relative <60; M 1st degree relative <50    Allergies  Allergen Reactions  . Biaxin [Clarithromycin]     Pt has history of arrhythmias  . Penicillins     Medication list reviewed and updated in  full in [facility].   GEN: No acute illnesses, no fevers, chills. GI: No n/v/d, eating normally Pulm: No SOB Interactive and getting along well at home.  Otherwise, ROS is as per the HPI.  Objective:   BP 130/80   Pulse 67   Temp 97.8 F (36.6 C) (Oral)   Ht 6' 0.25" (1.835 m)   Wt (!) 319 lb 12 oz (145 kg)   BMI 43.07 kg/m   GEN: WDWN, NAD, Non-toxic, A & O x 3 HEENT: Atraumatic, Normocephalic. Neck supple. No masses, No LAD. Ears and Nose: No external deformity. CV: RRR, No M/G/R. No JVD. No thrill. No extra heart sounds. PULM: CTA B, no wheezes, crackles, rhonchi. No retractions. No resp. distress. No accessory muscle use. ABD: S, NT, ND, hyperactive BS, No rebound, No HSM  EXTR: No c/c/e NEURO Normal gait.  PSYCH: Normally interactive. Conversant. Not depressed or anxious appearing.  Calm demeanor.   Laboratory and Imaging Data:  Assessment and Plan:   Diarrhea, unspecified type  Possibly due to beet supplement - d/c  Possible viral   Patient Instructions  Add in some fiber supplement - metamucil. Start with twice a day for now until it slows up.    Food Choices to Help Relieve Diarrhea, Adult  When you have diarrhea, the foods you eat and your eating habits are very important. Choosing the right foods and drinks can help:  Relieve diarrhea.  Replace lost fluids and nutrients.  Prevent dehydration.  What general guidelines should I follow? Relieving diarrhea  Choose foods with less than 2 g or .07 oz. of fiber per serving.  Limit fats to less than 8 tsp (38 g or 1.34 oz.) a day.  Avoid the following: ? Foods and beverages sweetened with high-fructose corn syrup, honey, or sugar alcohols such as xylitol, sorbitol, and mannitol. ? Foods that contain a lot of fat or sugar. ? Fried, greasy, or spicy foods. ? High-fiber grains, breads, and cereals. ? Raw fruits and vegetables.  Eat foods that are rich in probiotics. These foods include dairy  products such as yogurt and fermented milk products. They help increase healthy bacteria in the stomach and intestines (gastrointestinal tract, or GI tract).  If you have lactose intolerance, avoid dairy products. These may make your diarrhea worse.  Take medicine to help stop diarrhea (antidiarrheal medicine) only as told by your health care provider. Replacing nutrients  Eat small meals or snacks every 3-4 hours.  Eat bland foods, such as white rice, toast, or baked potato, until your diarrhea starts to  get better. Gradually reintroduce nutrient-rich foods as tolerated or as told by your health care provider. This includes: ? Well-cooked protein foods. ? Peeled, seeded, and soft-cooked fruits and vegetables. ? Low-fat dairy products.  Take vitamin and mineral supplements as told by your health care provider. Preventing dehydration   Start by sipping water or a special solution to prevent dehydration (oral rehydration solution, ORS). Urine that is clear or pale yellow means that you are getting enough fluid.  Try to drink at least 8-10 cups of fluid each day to help replace lost fluids.  You may add other liquids in addition to water, such as clear juice or decaffeinated sports drinks, as tolerated or as told by your health care provider.  Avoid drinks with caffeine, such as coffee, tea, or soft drinks.  Avoid alcohol. What foods are recommended? The items listed may not be a complete list. Talk with your health care provider about what dietary choices are best for you. Grains White rice. White, [place], or pita breads (fresh or toasted), including plain rolls, buns, or bagels. White pasta. Saltine, soda, or graham crackers. Pretzels. Low-fiber cereal. Cooked cereals made with water (such as cornmeal, farina, or cream cereals). Plain muffins. Matzo. Melba toast. Zwieback. Vegetables Potatoes (without the skin). Most well-cooked and canned vegetables without skins or seeds. Tender  lettuce. Fruits Apple sauce. Fruits canned in juice. Cooked apricots, cherries, grapefruit, peaches, pears, or plums. Fresh bananas and cantaloupe. Meats and other protein foods Baked or boiled chicken. Eggs. Tofu. Fish. Seafood. Smooth nut butters. Ground or well-cooked tender beef, ham, veal, lamb, pork, or poultry. Dairy Plain yogurt, kefir, and unsweetened liquid yogurt. Lactose-free milk, buttermilk, skim milk, or soy milk. Low-fat or nonfat hard cheese. Beverages Water. Low-calorie sports drinks. Fruit juices without pulp. Strained tomato and vegetable juices. Decaffeinated teas. Sugar-free beverages not sweetened with sugar alcohols. Oral rehydration solutions, if approved by your health care provider. Seasoning and other foods Bouillon, broth, or soups made from recommended foods. What foods are not recommended? The items listed may not be a complete list. Talk with your health care provider about what dietary choices are best for you. Grains Whole grain, whole wheat, bran, or rye breads, rolls, pastas, and crackers. Wild or brown rice. Whole grain or bran cereals. Barley. Oats and oatmeal. Corn tortillas or taco shells. Granola. Popcorn. Vegetables Raw vegetables. Fried vegetables. Cabbage, broccoli, Brussels sprouts, artichokes, baked beans, beet greens, corn, kale, legumes, peas, sweet potatoes, and yams. Potato skins. Cooked spinach and cabbage. Fruits Dried fruit, including raisins and dates. Raw fruits. Stewed or dried prunes. Canned fruits with syrup. Meat and other protein foods Fried or fatty meats. Deli meats. Chunky nut butters. Nuts and seeds. Beans and lentils. Berniece Salines. Hot dogs. Sausage. Dairy High-fat cheeses. Whole milk, chocolate milk, and beverages made with milk, such as milk shakes. Half-and-half. Cream. sour cream. Ice cream. Beverages Caffeinated beverages (such as coffee, tea, soda, or energy drinks). Alcoholic beverages. Fruit juices with pulp. Prune juice. Soft  drinks sweetened with high-fructose corn syrup or sugar alcohols. High-calorie sports drinks. Fats and oils Butter. Cream sauces. Margarine. Salad oils. Plain salad dressings. Olives. Avocados. Mayonnaise. Sweets and desserts Sweet rolls, doughnuts, and sweet breads. Sugar-free desserts sweetened with sugar alcohols such as xylitol and sorbitol. Seasoning and other foods Honey. Hot sauce. Chili powder. Gravy. Cream-based or milk-based soups. Pancakes and waffles. Summary  When you have diarrhea, the foods you eat and your eating habits are very important.  Make sure you get  at least 8-10 cups of fluid each day, or enough to keep your urine clear or pale yellow.  Eat bland foods and gradually reintroduce healthy, nutrient-rich foods as tolerated, or as told by your health care provider.  Avoid high-fiber, fried, greasy, or spicy foods. This information is not intended to replace advice given to you by your health care provider. Make sure you discuss any questions you have with your health care provider. Document Released: 01/10/2004 Document Revised: 10/17/2016 Document Reviewed: 10/17/2016 Elsevier Interactive Patient Education  2018 [employer].     Follow-up: No follow-ups on file.  Signed,  Maud Deed. Remmi Armenteros, MD   Allergies as of 04/07/2018      Reactions   Biaxin [clarithromycin]    Pt has history of arrhythmias   Penicillins       Medication List        Accurate as of 04/07/18  9:18 AM. Always use your most recent med list.          aspirin 325 MG tablet Take 325 mg by mouth daily.   diazepam 5 MG tablet Commonly known as:  VALIUM Take 1 tablet (5 mg total) by mouth every 8 (eight) hours as needed for anxiety.   diltiazem 180 MG 24 hr capsule Commonly known as:  TAZTIA XT Take 1 capsule (180 mg total) by mouth daily.   flecainide 100 MG tablet Commonly known as:  TAMBOCOR Take 1 tablet (100 mg total) by mouth 2 (two) times daily.   omeprazole 40 MG  capsule Commonly known as:  PRILOSEC Take 40 mg by mouth every other day.

## 2018-04-07 NOTE — Patient Instructions (Signed)
Add in some fiber supplement - metamucil. Start with twice a day for now until it slows up.    Food Choices to Help Relieve Diarrhea, Adult  When you have diarrhea, the foods you eat and your eating habits are very important. Choosing the right foods and drinks can help:  Relieve diarrhea.  Replace lost fluids and nutrients.  Prevent dehydration.  What general guidelines should I follow? Relieving diarrhea  Choose foods with less than 2 g or .07 oz. of fiber per serving.  Limit fats to less than 8 tsp (38 g or 1.34 oz.) a day.  Avoid the following: ? Foods and beverages sweetened with high-fructose corn syrup, honey, or sugar alcohols such as xylitol, sorbitol, and mannitol. ? Foods that contain a lot of fat or sugar. ? Fried, greasy, or spicy foods. ? High-fiber grains, breads, and cereals. ? Raw fruits and vegetables.  Eat foods that are rich in probiotics. These foods include dairy products such as yogurt and fermented milk products. They help increase healthy bacteria in the stomach and intestines (gastrointestinal tract, or GI tract).  If you have lactose intolerance, avoid dairy products. These may make your diarrhea worse.  Take medicine to help stop diarrhea (antidiarrheal medicine) only as told by your health care provider. Replacing nutrients  Eat small meals or snacks every 3-4 hours.  Eat bland foods, such as white rice, toast, or baked potato, until your diarrhea starts to get better. Gradually reintroduce nutrient-rich foods as tolerated or as told by your health care provider. This includes: ? Well-cooked protein foods. ? Peeled, seeded, and soft-cooked fruits and vegetables. ? Low-fat dairy products.  Take vitamin and mineral supplements as told by your health care provider. Preventing dehydration   Start by sipping water or a special solution to prevent dehydration (oral rehydration solution, ORS). Urine that is clear or pale yellow means that you are  getting enough fluid.  Try to drink at least 8-10 cups of fluid each day to help replace lost fluids.  You may add other liquids in addition to water, such as clear juice or decaffeinated sports drinks, as tolerated or as told by your health care provider.  Avoid drinks with caffeine, such as coffee, tea, or soft drinks.  Avoid alcohol. What foods are recommended? The items listed may not be a complete list. Talk with your health care provider about what dietary choices are best for you. Grains White rice. White, [place], or pita breads (fresh or toasted), including plain rolls, buns, or bagels. White pasta. Saltine, soda, or graham crackers. Pretzels. Low-fiber cereal. Cooked cereals made with water (such as cornmeal, farina, or cream cereals). Plain muffins. Matzo. Melba toast. Zwieback. Vegetables Potatoes (without the skin). Most well-cooked and canned vegetables without skins or seeds. Tender lettuce. Fruits Apple sauce. Fruits canned in juice. Cooked apricots, cherries, grapefruit, peaches, pears, or plums. Fresh bananas and cantaloupe. Meats and other protein foods Baked or boiled chicken. Eggs. Tofu. Fish. Seafood. Smooth nut butters. Ground or well-cooked tender beef, ham, veal, lamb, pork, or poultry. Dairy Plain yogurt, kefir, and unsweetened liquid yogurt. Lactose-free milk, buttermilk, skim milk, or soy milk. Low-fat or nonfat hard cheese. Beverages Water. Low-calorie sports drinks. Fruit juices without pulp. Strained tomato and vegetable juices. Decaffeinated teas. Sugar-free beverages not sweetened with sugar alcohols. Oral rehydration solutions, if approved by your health care provider. Seasoning and other foods Bouillon, broth, or soups made from recommended foods. What foods are not recommended? The items listed may not be  a complete list. Talk with your health care provider about what dietary choices are best for you. Grains Whole grain, whole wheat, bran, or rye breads,  rolls, pastas, and crackers. Wild or brown rice. Whole grain or bran cereals. Barley. Oats and oatmeal. Corn tortillas or taco shells. Granola. Popcorn. Vegetables Raw vegetables. Fried vegetables. Cabbage, broccoli, Brussels sprouts, artichokes, baked beans, beet greens, corn, kale, legumes, peas, sweet potatoes, and yams. Potato skins. Cooked spinach and cabbage. Fruits Dried fruit, including raisins and dates. Raw fruits. Stewed or dried prunes. Canned fruits with syrup. Meat and other protein foods Fried or fatty meats. Deli meats. Chunky nut butters. Nuts and seeds. Beans and lentils. Berniece Salines. Hot dogs. Sausage. Dairy High-fat cheeses. Whole milk, chocolate milk, and beverages made with milk, such as milk shakes. Half-and-half. Cream. sour cream. Ice cream. Beverages Caffeinated beverages (such as coffee, tea, soda, or energy drinks). Alcoholic beverages. Fruit juices with pulp. Prune juice. Soft drinks sweetened with high-fructose corn syrup or sugar alcohols. High-calorie sports drinks. Fats and oils Butter. Cream sauces. Margarine. Salad oils. Plain salad dressings. Olives. Avocados. Mayonnaise. Sweets and desserts Sweet rolls, doughnuts, and sweet breads. Sugar-free desserts sweetened with sugar alcohols such as xylitol and sorbitol. Seasoning and other foods Honey. Hot sauce. Chili powder. Gravy. Cream-based or milk-based soups. Pancakes and waffles. Summary  When you have diarrhea, the foods you eat and your eating habits are very important.  Make sure you get at least 8-10 cups of fluid each day, or enough to keep your urine clear or pale yellow.  Eat bland foods and gradually reintroduce healthy, nutrient-rich foods as tolerated, or as told by your health care provider.  Avoid high-fiber, fried, greasy, or spicy foods. This information is not intended to replace advice given to you by your health care provider. Make sure you discuss any questions you have with your health care  provider. Document Released: 01/10/2004 Document Revised: 10/17/2016 Document Reviewed: 10/17/2016 Elsevier Interactive Patient Education  [employer].

## 2018-04-09 ENCOUNTER — Encounter: Payer: Self-pay | Admitting: Family Medicine

## 2018-04-12 ENCOUNTER — Encounter: Payer: Self-pay | Admitting: *Deleted

## 2018-04-12 NOTE — Telephone Encounter (Signed)
Please call him and see how he is doing.   I don't see how the tick bites could relate to diarrhea.   If systemic flu type symptoms, then we should treat him with antibiotics.   If he has diarrhea only, I want to get him to see GI.

## 2018-04-12 NOTE — Telephone Encounter (Signed)
Spoke with Brandon [name].  He states his symptoms are the same. He has a friend who is a Marine scientist and her husband had the same thing.  She put her husband on a gluten free diet and that took care of his issues so she recommended that Brandon [name] try that.  He states he has cut out bread from his diet and things seem a little better.  He still has loose stools in the morning but things seems to improve as the day goes on.  He is still taking the Metamucil twice a day which helps with the pain and calms things down.  He denies andy flu like symptoms.  Would like to know what Dr. Lorelei Pont thinks he should do.   Please advise.

## 2018-04-15 ENCOUNTER — Other Ambulatory Visit
Admission: RE | Admit: 2018-04-15 | Discharge: 2018-04-15 | Disposition: A | Discharge status: Home / Self Care | Payer: BLUE CROSS/BLUE SHIELD | Source: Ambulatory Visit | Attending: Gastroenterology | Admitting: Gastroenterology

## 2018-04-15 DIAGNOSIS — R197 Diarrhea, unspecified: Secondary | ICD-10-CM | POA: Diagnosis present

## 2018-04-15 LAB — C DIFFICILE QUICK SCREEN W PCR REFLEX
C DIFFICILE (CDIFF) INTERP: NOT DETECTED
C DIFFICILE (CDIFF) TOXIN: NEGATIVE
C DIFFICLE (CDIFF) ANTIGEN: NEGATIVE

## 2018-04-15 LAB — GASTROINTESTINAL PANEL BY PCR, STOOL (REPLACES STOOL CULTURE)
ADENOVIRUS F40/41: NOT DETECTED
Astrovirus: NOT DETECTED
CRYPTOSPORIDIUM: NOT DETECTED
Campylobacter species: NOT DETECTED
Cyclospora cayetanensis: NOT DETECTED
ENTEROPATHOGENIC E COLI (EPEC): NOT DETECTED
Entamoeba histolytica: NOT DETECTED
Enteroaggregative E coli (EAEC): NOT DETECTED
Enterotoxigenic E coli (ETEC): NOT DETECTED
Giardia lamblia: NOT DETECTED
Norovirus GI/GII: NOT DETECTED
PLESIMONAS SHIGELLOIDES: NOT DETECTED
ROTAVIRUS A: NOT DETECTED
SHIGELLA/ENTEROINVASIVE E COLI (EIEC): NOT DETECTED
Salmonella species: NOT DETECTED
Sapovirus (I, II, IV, and V): NOT DETECTED
Shiga like toxin producing E coli (STEC): NOT DETECTED
Vibrio cholerae: NOT DETECTED
Vibrio species: NOT DETECTED
YERSINIA ENTEROCOLITICA: NOT DETECTED

## 2018-04-19 ENCOUNTER — Encounter: Payer: Self-pay | Admitting: *Deleted

## 2018-04-20 ENCOUNTER — Ambulatory Visit
Admission: RE | Admit: 2018-04-20 | Discharge: 2018-04-20 | Disposition: A | Discharge status: Home / Self Care | Payer: BLUE CROSS/BLUE SHIELD | Source: Ambulatory Visit | Attending: Gastroenterology | Admitting: Gastroenterology

## 2018-04-20 ENCOUNTER — Ambulatory Visit: Payer: BLUE CROSS/BLUE SHIELD | Admitting: Anesthesiology

## 2018-04-20 ENCOUNTER — Encounter
Admission: RE | Disposition: A | Discharge status: Home / Self Care | Payer: Self-pay | Source: Ambulatory Visit | Attending: Gastroenterology

## 2018-04-20 ENCOUNTER — Encounter: Payer: Self-pay | Admitting: *Deleted

## 2018-04-20 DIAGNOSIS — Z86010 Personal history of colonic polyps: Secondary | ICD-10-CM | POA: Insufficient documentation

## 2018-04-20 DIAGNOSIS — Z7982 Long term (current) use of aspirin: Secondary | ICD-10-CM | POA: Insufficient documentation

## 2018-04-20 DIAGNOSIS — R197 Diarrhea, unspecified: Secondary | ICD-10-CM | POA: Insufficient documentation

## 2018-04-20 DIAGNOSIS — Z881 Allergy status to other antibiotic agents status: Secondary | ICD-10-CM | POA: Diagnosis not present

## 2018-04-20 DIAGNOSIS — M5000 Cervical disc disorder with myelopathy, unspecified cervical region: Secondary | ICD-10-CM | POA: Diagnosis not present

## 2018-04-20 DIAGNOSIS — M5010 Cervical disc disorder with radiculopathy, unspecified cervical region: Secondary | ICD-10-CM | POA: Diagnosis not present

## 2018-04-20 DIAGNOSIS — Z6841 Body Mass Index (BMI) 40.0 and over, adult: Secondary | ICD-10-CM | POA: Insufficient documentation

## 2018-04-20 DIAGNOSIS — Z79899 Other long term (current) drug therapy: Secondary | ICD-10-CM | POA: Insufficient documentation

## 2018-04-20 DIAGNOSIS — G43909 Migraine, unspecified, not intractable, without status migrainosus: Secondary | ICD-10-CM | POA: Insufficient documentation

## 2018-04-20 DIAGNOSIS — I480 Paroxysmal atrial fibrillation: Secondary | ICD-10-CM | POA: Insufficient documentation

## 2018-04-20 DIAGNOSIS — K625 Hemorrhage of anus and rectum: Secondary | ICD-10-CM | POA: Insufficient documentation

## 2018-04-20 DIAGNOSIS — Z880 Allergy status to penicillin: Secondary | ICD-10-CM | POA: Insufficient documentation

## 2018-04-20 DIAGNOSIS — D123 Benign neoplasm of transverse colon: Secondary | ICD-10-CM | POA: Insufficient documentation

## 2018-04-20 DIAGNOSIS — K5150 Left sided colitis without complications: Secondary | ICD-10-CM | POA: Diagnosis not present

## 2018-04-20 DIAGNOSIS — K5790 Diverticulosis of intestine, part unspecified, without perforation or abscess without bleeding: Secondary | ICD-10-CM | POA: Diagnosis not present

## 2018-04-20 HISTORY — PX: COLONOSCOPY WITH PROPOFOL: SHX5780

## 2018-04-20 HISTORY — DX: Polyp of colon: K63.5

## 2018-04-20 HISTORY — DX: Cardiac arrhythmia, unspecified: I49.9

## 2018-04-20 SURGERY — COLONOSCOPY WITH PROPOFOL
Anesthesia: General

## 2018-04-20 MED ORDER — PROPOFOL 500 MG/50ML IV EMUL
INTRAVENOUS | Status: AC
  Filled 2018-04-20: qty 50

## 2018-04-20 MED ORDER — PHENYLEPHRINE HCL 10 MG/ML IJ SOLN
INTRAMUSCULAR | Status: DC | PRN
  Administered 2018-04-20 (×2): 100 ug via INTRAVENOUS

## 2018-04-20 MED ORDER — LIDOCAINE HCL (PF) 2 % IJ SOLN
INTRAMUSCULAR | Status: AC
  Filled 2018-04-20: qty 10

## 2018-04-20 MED ORDER — SODIUM CHLORIDE 0.9 % IV SOLN
INTRAVENOUS | Status: DC
  Administered 2018-04-20: 1000 mL via INTRAVENOUS

## 2018-04-20 MED ORDER — PROPOFOL 500 MG/50ML IV EMUL
INTRAVENOUS | Status: DC | PRN
Start: 2018-04-20 — End: 2018-04-20
  Administered 2018-04-20: 150 ug/kg/min via INTRAVENOUS

## 2018-04-20 MED ORDER — PROPOFOL 10 MG/ML IV BOLUS
INTRAVENOUS | Status: AC
  Filled 2018-04-20: qty 20

## 2018-04-20 MED ORDER — PROPOFOL 10 MG/ML IV BOLUS
INTRAVENOUS | Status: DC | PRN
  Administered 2018-04-20: 150 mg via INTRAVENOUS

## 2018-04-20 MED ORDER — LIDOCAINE HCL (CARDIAC) PF 100 MG/5ML IV SOSY
PREFILLED_SYRINGE | INTRAVENOUS | Status: DC | PRN
  Administered 2018-04-20: 100 mg via INTRAVENOUS

## 2018-04-20 NOTE — H&P (Signed)
Outpatient short stay form Pre-procedure 04/20/2018 8:47 AM Lollie Sails MD  Primary Physician: Donella Stade, MD  Reason for visit: Colonoscopy  History of present illness: Patient is a 52 year old male presenting today for colonoscopy.  Several weeks ago he developed some diarrhea this was then followed by rectal bleeding.  He states he has not seen any blood in stool more recently however continues to have change of bowel habits with variable stool consistency.  Eyes currently any abdominal pain.  Is been taking some dicyclomine which seems to slow things a little bit and crease the loose stools.  He does take a 325 mg aspirin in regards to his history of atrial fibrillation but is held that for 5 days.  He takes no other aspirin products or blood thinning agent.    Current Facility-Administered Medications:  .  0.9 %  sodium chloride infusion, , Intravenous, Continuous, Lollie Sails, MD, Last Rate: 20 mL/hr at 04/20/18 0846, 1,000 mL at 04/20/18 0846  Medications Prior to Admission  Medication Sig Dispense Refill Last Dose  . cefUROXime (CEFTIN) 250 MG tablet Take 250 mg by mouth 2 (two) times daily with a meal.     . dicyclomine (BENTYL) 10 MG capsule Take 10 mg by mouth 4 (four) times daily -  before meals and at bedtime.     Marland Kitchen diltiazem (TAZTIA XT) 180 MG 24 hr capsule Take 1 capsule (180 mg total) by mouth daily. 90 capsule 3 04/20/2018 at Unknown time  . flecainide (TAMBOCOR) 100 MG tablet Take 1 tablet (100 mg total) by mouth 2 (two) times daily. 180 tablet 3 04/20/2018 at Unknown time  . meclizine (ANTIVERT) 12.5 MG tablet Take 12.5 mg by mouth 3 (three) times daily as needed for dizziness.     . SUMAtriptan (IMITREX) 50 MG tablet Take 50 mg by mouth every 2 (two) hours as needed for migraine. May repeat in 2 hours if headache persists or recurs.     Marland Kitchen aspirin 325 MG tablet Take 325 mg by mouth daily.     04/15/2018  . diazepam (VALIUM) 5 MG tablet Take 1 tablet (5 mg  total) by mouth every 8 (eight) hours as needed for anxiety. 30 tablet 0 Taking  . omeprazole (PRILOSEC) 40 MG capsule Take 40 mg by mouth every other day.    Taking     Allergies  Allergen Reactions  . Biaxin [Clarithromycin]     Pt has history of arrhythmias  . Penicillins      Past Medical History:  Diagnosis Date  . ARTHRITIS 06/07/2009  . Atrial fibrillation ([place])   . ATRIAL FIBRILLATION, PAROXYSMAL 06/07/2009  . Cervical disc disorder with radiculopathy of cervical region 10/05/2013  . CHICKENPOX, HX OF 06/07/2009  . 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 Colon polyp   . DEPRESSION 06/07/2009  . DIVERTICULITIS, COLON 06/07/2009  . Dysrhythmia    a-fib  . Hematuria     Review of systems:      Physical Exam    Heart and lungs: Regular rate and rhythm without rub or gallop, lungs are bilaterally clear.    HEENT: Normocephalic atraumatic eyes are anicteric    Other:    Pertinant exam for procedure: Soft nontender nondistended bowel sounds positive normoactive, obese.    Planned proceedures: Colonoscopy and indicated procedures. I have discussed the risks benefits and complications of procedures to include not limited to bleeding, infection, perforation and the risk of sedation and the patient wishes to proceed.  Lollie Sails, MD Gastroenterology 04/20/2018  8:47 AM

## 2018-04-20 NOTE — Anesthesia Post-op Follow-up Note (Signed)
Anesthesia QCDR form completed.        

## 2018-04-20 NOTE — Anesthesia Postprocedure Evaluation (Signed)
Anesthesia Post Note  Patient: Brandon [name]  Procedure(s) Performed: COLONOSCOPY WITH PROPOFOL (N/A )  Patient location during evaluation: Endoscopy Anesthesia Type: General Level of consciousness: awake and alert Pain management: pain level controlled Vital Signs Assessment: post-procedure vital signs reviewed and stable Respiratory status: spontaneous breathing, nonlabored ventilation, respiratory function stable and patient connected to nasal cannula oxygen Cardiovascular status: blood pressure returned to baseline and stable Postop Assessment: no apparent nausea or vomiting Anesthetic complications: no     Last Vitals:  Vitals:   04/20/18 1000 04/20/18 1010  BP: 122/86 (!) 142/92  Pulse: 69 79  Resp: 10 15  Temp:    SpO2: 92% 100%    Last Pain:  Vitals:   04/20/18 0950  TempSrc: Tympanic  PainSc:                  Shaneisha Burkel S

## 2018-04-20 NOTE — Op Note (Signed)
[facility] Gastroenterology Patient Name: Brandon [name] Procedure Date: 04/20/2018 8:50 AM MRN: [id number] Account #: [id number] Date of Birth: [birth year] Admit Type: Outpatient Age: 52 Room: [facility] ENDO ROOM 3 Gender: Male Note Status: Finalized Procedure:            Colonoscopy Indications:          Rectal bleeding, Clinically significant diarrhea of                        unexplained origin Providers:            Lollie Sails, MD Referring MD:         Maud Deed. Copland MD, MD (Referring MD) Medicines:            Monitored Anesthesia Care Complications:        No immediate complications. Procedure:            Pre-Anesthesia Assessment:                       - ASA Grade Assessment: III - A patient with severe                        systemic disease.                       After obtaining informed consent, the colonoscope was                        passed under direct vision. Throughout the procedure,                        the patient's blood pressure, pulse, and oxygen                        saturations were monitored continuously. The                        Colonoscope was introduced through the anus and                        advanced to the the cecum, identified by appendiceal                        orifice and ileocecal valve. The colonoscopy was                        unusually difficult due to significant looping.                        Successful completion of the procedure was aided by                        changing the patient to a supine position. The patient                        tolerated the procedure well. The quality of the bowel                        preparation was fair. Findings:      Inflammation characterized by erythema, friability and granularity was  found in a continuous and circumferential pattern from the rectum to the       descending colon. The descending colon, the proximal descending colon,       the transverse colon,  the ascending colon and the cecum were spared.       This was severe, and when compared to previous examinations, the       findings are new. Biopsies for histology were taken with a cold forceps       from the cecum, ascending colon, transverse colon, descending colon,       sigmoid colon and rectum for evaluation of microscopic colitis.      The terminal ileum appeared normal. Biopsies were taken with a cold       forceps for histology.      Multiple medium-mouthed diverticula were found in the sigmoid colon,       descending colon and transverse colon.      The digital rectal exam was normal.      A 4 mm polyp was found in the proximal transverse colon. The polyp was       sessile. The polyp was removed with a cold biopsy forceps. Resection and       retrieval were complete. Impression:           - Preparation of the colon was fair.                       - Left-sided ulcerative colitis. Inflammation was found                        from the rectum to the descending colon. This was                        severe, new compared to previous examinations. Biopsied.                       - The examined portion of the ileum was normal.                        Biopsied.                       - Diverticulosis in the sigmoid colon, in the                        descending colon and in the transverse colon. Recommendation:       - Await pathology results. Path results requested stat,                        will start steroid course and 5ASA if consistant with                        UC as noted. Procedure Code(s):    --- Professional ---                       [phone], Colonoscopy, flexible; with biopsy, single or                        multiple Diagnosis Code(s):    --- Professional ---  K51.511, Left sided colitis with rectal bleeding                       K62.5, Hemorrhage of anus and rectum                       R19.7, Diarrhea, unspecified                       K57.30,  Diverticulosis of large intestine without                        perforation or abscess without bleeding CPT copyright 2017 American Medical Association. All rights reserved. The codes documented in this report are preliminary and upon coder review may  be revised to meet current compliance requirements. Lollie Sails, MD 04/20/2018 10:00:18 AM This report has been signed electronically. Number of Addenda: 0 Note Initiated On: 04/20/2018 8:50 AM Scope Withdrawal Time: 0 hours 29 minutes 12 seconds  Total Procedure Duration: 0 hours 48 minutes 10 seconds       [facility]

## 2018-04-20 NOTE — Transfer of Care (Signed)
Immediate Anesthesia Transfer of Care Note  Patient: Abdelaziz Westenberger  Procedure(s) Performed: COLONOSCOPY WITH PROPOFOL (N/A )  Patient Location: PACU and Endoscopy Unit  Anesthesia Type:General  Level of Consciousness: drowsy  Airway & Oxygen Therapy: Patient Spontanous Breathing and Patient connected to nasal cannula oxygen  Post-op Assessment: Report given to RN and Post -op Vital signs reviewed and stable  Post vital signs: Reviewed and stable  Last Vitals:  Vitals Value Taken Time  BP 99/60 04/20/2018  9:56 AM  Temp 36.1 C 04/20/2018  9:50 AM  Pulse 72 04/20/2018  9:56 AM  Resp 10 04/20/2018  9:56 AM  SpO2 89 % 04/20/2018  9:56 AM  Vitals shown include unvalidated device data.  Last Pain:  Vitals:   04/20/18 0950  TempSrc: Tympanic  PainSc:          Complications: No apparent anesthesia complications

## 2018-04-20 NOTE — Anesthesia Preprocedure Evaluation (Addendum)
Anesthesia Evaluation  Patient identified by MRN, date of birth, ID band Patient awake    Reviewed: Allergy & Precautions, H&P , NPO status , Patient's Chart, lab work & pertinent test results, reviewed documented beta blocker date and time   History of Anesthesia Complications Negative for: history of anesthetic complications  Airway Mallampati: III  TM Distance: >3 FB Neck ROM: full    Dental  (+) Dental Advidsory Given, Caps, Teeth Intact   Pulmonary neg pulmonary ROS,           Cardiovascular Exercise Tolerance: Good hypertension, (-) angina(-) CAD, (-) Past MI, (-) Cardiac Stents and (-) CABG + dysrhythmias Atrial Fibrillation (-) Valvular Problems/Murmurs     Neuro/Psych PSYCHIATRIC DISORDERS Depression negative neurological ROS     GI/Hepatic Neg liver ROS, GERD  Controlled,  Endo/Other  Morbid obesity  Renal/GU negative Renal ROS  negative genitourinary   Musculoskeletal   Abdominal   Peds  Hematology negative hematology ROS (+)   Anesthesia Other Findings Past Medical History: 06/07/2009: ARTHRITIS No date: Atrial fibrillation ([place]) 06/07/2009: ATRIAL FIBRILLATION, PAROXYSMAL 10/05/2013: Cervical disc disorder with radiculopathy of cervical  region 06/07/2009: CHICKENPOX, HX OF 03/03/2012: Classical migraine No date: Colon polyp 06/07/2009: DEPRESSION 06/07/2009: DIVERTICULITIS, COLON No date: Dysrhythmia     Comment:  a-fib No date: Hematuria   Reproductive/Obstetrics negative OB ROS                           Anesthesia Physical Anesthesia Plan  ASA: III  Anesthesia Plan: General   Post-op Pain Management:    Induction: Intravenous  PONV Risk Score and Plan: 2 and Propofol infusion  Airway Management Planned: Nasal Cannula  Additional Equipment:   Intra-op Plan:   Post-operative Plan:   Informed Consent: I have reviewed the patients History and Physical, chart, labs  and discussed the procedure including the risks, benefits and alternatives for the proposed anesthesia with the patient or authorized representative who has indicated his/her understanding and acceptance.   Dental Advisory Given  Plan Discussed with: Anesthesiologist, CRNA and Surgeon  Anesthesia Plan Comments:         Anesthesia Quick Evaluation

## 2018-04-21 ENCOUNTER — Encounter: Payer: Self-pay | Admitting: Gastroenterology

## 2018-04-21 LAB — SURGICAL PATHOLOGY

## 2018-04-22 ENCOUNTER — Telehealth: Payer: Self-pay | Admitting: Cardiovascular Disease

## 2018-04-22 NOTE — Telephone Encounter (Signed)
Patient calling States he would like to speak with nurse No other details were given when asked Please call to discuss

## 2018-04-22 NOTE — Telephone Encounter (Signed)
Spoke with patient and he states that he had colonoscopy and was diagnosed with ulcerative colitis. He reports some continued bleeding and would like to hold his aspirin. Advised of risks but that he could until the bleeding stops. He verbalized understanding with no further questions at this time.

## 2018-04-23 NOTE — Telephone Encounter (Signed)
He actually does not need aspirin for his atrial fibrillation And 325 mg doses too high If he wants treatment for atrial fibrillation we would use warfarin, xarelto, eliquis There is still risk of stroke with aspirin Aspirin does not help decrease his stroke risk

## 2018-04-23 NOTE — Telephone Encounter (Signed)
I left a message on the patient's identified voice mail (ok per DPR) of Dr. Donivan Scull recommendations regarding his ASA and need for anticoagulation for his a-fib. I advised that he is due for follow up with Dr. Rockey Situ anyway and if would please call back to schedule an appointment we would appreciate it or if he would like to discuss this further with the nurse that would be fine.

## 2018-04-27 NOTE — Telephone Encounter (Signed)
Spoke with patient and reviewed providers recommendations. He reports that he has not been in afib for 3-4 years now. Confirmed his upcoming appointment information and he verbalized understanding with no further questions at this time.

## 2018-04-28 ENCOUNTER — Encounter: Payer: Self-pay | Admitting: *Deleted

## 2018-04-28 DIAGNOSIS — K51919 Ulcerative colitis, unspecified with unspecified complications: Secondary | ICD-10-CM | POA: Insufficient documentation

## 2018-04-28 DIAGNOSIS — K5190 Ulcerative colitis, unspecified, without complications: Secondary | ICD-10-CM

## 2018-04-28 HISTORY — DX: Ulcerative colitis, unspecified, without complications: K51.90

## 2018-05-11 ENCOUNTER — Other Ambulatory Visit: Payer: BLUE CROSS/BLUE SHIELD

## 2018-05-17 ENCOUNTER — Ambulatory Visit (INDEPENDENT_AMBULATORY_CARE_PROVIDER_SITE_OTHER): Payer: BLUE CROSS/BLUE SHIELD | Admitting: Family Medicine

## 2018-05-17 ENCOUNTER — Encounter: Payer: Self-pay | Admitting: Family Medicine

## 2018-05-17 VITALS — BP 130/82 | HR 65 | Temp 97.9°F | Ht 72.0 in | Wt 317.0 lb

## 2018-05-17 DIAGNOSIS — Z0000 Encounter for general adult medical examination without abnormal findings: Secondary | ICD-10-CM | POA: Diagnosis not present

## 2018-05-17 NOTE — Progress Notes (Signed)
Dr. Frederico Hamman T. Meloney Feld, MD, [facility] [place], [address] Phone: [phone] Fax: [phone]  05/17/2018  Patient: Brandon [name], MRN: [id number], DOB: 06/14/1966, 52 y.o.  Primary Physician:  Owens Loffler, MD   Chief Complaint  Patient presents with  . Annual Exam   Subjective:   Brandon [name] is a 52 y.o. pleasant patient who presents with the following:  Preventative Health Maintenance Visit:  Health Maintenance Summary Reviewed and updated, unless pt declines services.  Tobacco History Reviewed. Dipping Alcohol: No concerns, no excessive use Exercise Habits: Some activity, rec at least 30 mins 5 times a week STD concerns: no risk or activity to increase risk Drug Use: None Encouraged self-testicular check  Recent colonoscopy and dx with UC - doing better on mesalamine and prednisone.  Health Maintenance  Topic Date Due  . HIV Screening  01/08/1979  . INFLUENZA VACCINE  06/03/2018  . COLONOSCOPY  04/21/2019  . TETANUS/TDAP  12/04/2020  . Hepatitis C Screening  Completed   Immunization History  Administered Date(s) Administered  . Influenza Whole 11/15/2009, 07/14/2011  . Influenza, Seasonal, Injecte, Preservative Fre 11/08/2012  . Influenza,inj,Quad PF,6+ Mos 08/24/2013, 08/16/2014, 08/20/2015, 06/23/2016, 10/08/2017  . Td 12/04/2010   Patient Active Problem List   Diagnosis Date Noted  . Ulcerative colitis ([facility]) 04/28/2018  . GERD (gastroesophageal reflux disease) 05/13/2016  . Ventricular ectopy 12/14/2015  . Cervical disc disorder with radiculopathy of cervical region 10/05/2013  . Carpal tunnel syndrome 10/05/2013  . Essential hypertension 06/16/2013  . 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 Basal cell carcinoma 06/25/2011  . Morbid obesity with BMI of 40.0-44.9, adult ([place]) 06/07/2009  . ATRIAL FIBRILLATION, PAROXYSMAL 06/07/2009  . DIVERTICULITIS, COLON 06/07/2009  .  ARTHRITIS 06/07/2009   Past Medical History:  Diagnosis Date  . ARTHRITIS 06/07/2009  . Atrial fibrillation ([place])   . ATRIAL FIBRILLATION, PAROXYSMAL 06/07/2009  . Cervical disc disorder with radiculopathy of cervical region 10/05/2013  . CHICKENPOX, HX OF 06/07/2009  . 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 Colon polyp   . DEPRESSION 06/07/2009  . DIVERTICULITIS, COLON 06/07/2009  . Dysrhythmia    a-fib  . Hematuria   . Ulcerative colitis ([place]) 04/28/2018   Past Surgical History:  Procedure Laterality Date  . CARDIAC CATHETERIZATION    . COLONOSCOPY    . COLONOSCOPY WITH PROPOFOL N/A 04/20/2018   Procedure: COLONOSCOPY WITH PROPOFOL;  Surgeon: Lollie Sails, MD;  Location: [facility] ENDOSCOPY;  Service: Endoscopy;  Laterality: N/A;  . FOOT SURGERY     left foot  . TONSILLECTOMY     Social History   Socioeconomic History  . Marital status: Single    Spouse name: Not on file  . Number of children: Not on file  . Years of education: Not on file  . Highest education level: Not on file  Occupational History  . Occupation: self employed    Fish farm manager: [employer]  Social Needs  . Financial resource strain: Not on file  . Food insecurity:    Worry: Not on file    Inability: Not on file  . Transportation needs:    Medical: Not on file    Non-medical: Not on file  Tobacco Use  . Smoking status: Never Smoker  . Smokeless tobacco: Current User    Types: Snuff  . Tobacco comment: occasionally  Substance and Sexual Activity  . Alcohol use: Not Currently    Alcohol/week: 0.0 oz  . Drug use:  No  . Sexual activity: Not on file  Lifestyle  . Physical activity:    Days per week: Not on file    Minutes per session: Not on file  . Stress: Not on file  Relationships  . Social connections:    Talks on phone: Not on file    Gets together: Not on file    Attends religious service: Not on file    Active member of club or organization: Not on file    Attends meetings of clubs or organizations: Not  on file    Relationship status: Not on file  . Intimate partner violence:    Fear of current or ex partner: Not on file    Emotionally abused: Not on file    Physically abused: Not on file    Forced sexual activity: Not on file  Other Topics Concern  . Not on file  Social History Narrative   Does not do regular exercise. Former dip - quit about 1 year ago.    Family History  Problem Relation Age of Onset  . Stroke Father   . Arthritis Unknown   . Diabetes Unknown        1st degree relative  . Stroke Unknown        F 1st degree relative <60; M 1st degree relative <50   Allergies  Allergen Reactions  . Biaxin [Clarithromycin]     Pt has history of arrhythmias  . Penicillins     Medication list has been reviewed and updated.   General: Denies fever, chills, sweats. No significant weight loss. Eyes: Denies blurring,significant itching ENT: Denies earache, sore throat, and hoarseness. Cardiovascular: Denies chest pains, palpitations, dyspnea on exertion Respiratory: Denies cough, dyspnea at rest,wheeezing Breast: no concerns about lumps GI: Denies nausea, vomiting, diarrhea, constipation, change in bowel habits, abdominal pain, melena, hematochezia GU: Denies penile discharge, ED, urinary flow / outflow problems. No STD concerns. Musculoskeletal: Denies back pain, joint pain Derm: Denies rash, itching Neuro: Denies  paresthesias, frequent falls, frequent headaches Psych: Denies depression, anxiety Endocrine: Denies cold intolerance, heat intolerance, polydipsia Heme: Denies enlarged lymph nodes Allergy: No hayfever  Objective:   BP 130/82   Pulse 65   Temp 97.9 F (36.6 C) (Oral)   Ht 6' (1.829 m)   Wt (!) 317 lb (143.8 kg)   BMI 42.99 kg/m  Ideal Body Weight: Weight in (lb) to have BMI = 25: 183.9  No exam data present  GEN: well developed, well nourished, no acute distress Eyes: conjunctiva and lids normal, PERRLA, EOMI ENT: TM clear, nares clear, oral exam  WNL Neck: supple, no lymphadenopathy, no thyromegaly, no JVD Pulm: clear to auscultation and percussion, respiratory effort normal CV: regular rate and rhythm, S1-S2, no murmur, rub or gallop, no bruits, peripheral pulses normal and symmetric, no cyanosis, clubbing, edema or varicosities GI: soft, non-tender; no hepatosplenomegaly, masses; active bowel sounds all quadrants GU: no hernia, testicular mass, penile discharge Lymph: no cervical, axillary or inguinal adenopathy MSK: gait normal, muscle tone and strength WNL, no joint swelling, effusions, discoloration, crepitus  SKIN: clear, good turgor, color WNL, no rashes, lesions, or ulcerations Neuro: normal mental status, normal strength, sensation, and motion Psych: alert; oriented to person, place and time, normally interactive and not anxious or depressed in appearance.  All labs reviewed with patient.  Lab Results  Component Value Date   WBC 7.3 04/24/2017   HGB 15.4 04/24/2017   HCT 45.5 04/24/2017   PLT 257.0 04/24/2017  GLUCOSE 89 04/24/2017   CHOL 126 04/24/2017   TRIG 99.0 04/24/2017   HDL 23.80 (L) 04/24/2017   LDLCALC 83 04/24/2017   ALT 22 04/24/2017   AST 18 04/24/2017   NA 139 04/24/2017   K 4.2 04/24/2017   CL 102 04/24/2017   CREATININE 1.00 04/24/2017   BUN 13 04/24/2017   CO2 29 04/24/2017   TSH 2.24 04/24/2017   PSA 0.83 04/24/2017   INR 1.1 (H) 03/26/2011     Assessment and Plan:   Healthcare maintenance  Doing much better after UC dx  Health Maintenance Exam: The patient's preventative maintenance and recommended screening tests for an annual wellness exam were reviewed in full today. Brought up to date unless services declined.  Counselled on the importance of diet, exercise, and its role in overall health and mortality. The patient's FH and SH was reviewed, including their home life, tobacco status, and drug and alcohol status.  Follow-up in 1 year for physical exam or additional follow-up  below.  Follow-up: No follow-ups on file. Or follow-up in 1 year if not noted.  Signed,  Maud Deed. Ayianna Darnold, MD   Allergies as of 05/17/2018      Reactions   Biaxin [clarithromycin]    Pt has history of arrhythmias   Penicillins       Medication List        Accurate as of 05/17/18  9:31 AM. Always use your most recent med list.          aspirin 325 MG tablet Take 325 mg by mouth daily.   diazepam 5 MG tablet Commonly known as:  VALIUM Take 1 tablet (5 mg total) by mouth every 8 (eight) hours as needed for anxiety.   dicyclomine 10 MG capsule Commonly known as:  BENTYL Take 10 mg by mouth 4 (four) times daily -  before meals and at bedtime.   diltiazem 180 MG 24 hr capsule Commonly known as:  TAZTIA XT Take 1 capsule (180 mg total) by mouth daily.   flecainide 100 MG tablet Commonly known as:  TAMBOCOR Take 1 tablet (100 mg total) by mouth 2 (two) times daily.   Mesalamine 800 MG Tbec Take by mouth 3 (three) times daily.   omeprazole 40 MG capsule Commonly known as:  PRILOSEC Take 40 mg by mouth every other day.   predniSONE 10 MG (21) Tbpk tablet Commonly known as:  STERAPRED UNI-PAK 21 TAB Take by mouth daily.   SUMAtriptan 50 MG tablet Commonly known as:  IMITREX Take 50 mg by mouth every 2 (two) hours as needed for migraine. May repeat in 2 hours if headache persists or recurs.

## 2018-05-23 NOTE — Progress Notes (Signed)
Cardiology Office Note  Date:  05/25/2018   ID:  Brandon [name], DOB [birth year], MRN [id number]  PCP:  Owens Loffler, MD   Chief Complaint  Patient presents with  . Other    12 month follow up. Patient denies chest pain and SOB at this time. Meds reviewed verbally with patient.     HPI:   Brandon [name] is a 52 -year-old gentleman with history of  hypertension,  obesity,  chronic back pain,  atrial fibrillation , previously declined anticoagulation Atrial fibrillation likely dating back to before 2012, appreciated fluttering, does not remember circumstances Remote history of PVCs Cardiac catheterization may 2012 showed no significant CAD, normal ejection fraction greater than 55%. who presents for routine followup of his atrial fibrillation.  Problems with diarrhea, ulcerative colitis Evaluated by GI  No diabetes, smoking, cholesterol good No exercise Weight running high Discussed his anticoagulation with him, he does not appreciate atrial fibrillation Is on aspirin 325 mg "because you told me to" Discussed recent data showing no significant benefit on aspirin in stroke prevention Continues to take flecainide, diltiazem.he prefers to stay on these medications  Previously fell off of a combine onto a metal disc directly striking his mid and lower lumbar spine. 08/04/2016 Had severe back pain, continues to have residual symptoms but much better  Otherwise reports he is doing well,  denies any palpitations concerning for atrial fibrillation.   Blood pressure running low today, denies any orthostasis symptoms  Discussed his risk factors  CHADS VASC of 1 We've previously discussed whether he wanted to be on anticoagulation. 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 Continued problems with arthritis.  Other past medical history  several years since his last episode of atrial fibrillation.   Cardiac catheterization may 2012 showed no significant CAD, normal ejection fraction greater than 55%. Cholesterol well controlled without any cluster medication  PMH:   has a past medical history of ARTHRITIS (06/07/2009), Atrial fibrillation ([place]), ATRIAL FIBRILLATION, PAROXYSMAL (06/07/2009), Cervical disc disorder with radiculopathy of cervical region (10/05/2013), CHICKENPOX, HX OF (06/07/2009), Classical migraine (03/03/2012), Colon polyp, DEPRESSION (06/07/2009), DIVERTICULITIS, COLON (06/07/2009), Dysrhythmia, Hematuria, and Ulcerative colitis ([place]) (04/28/2018).  PSH:    Past Surgical History:  Procedure Laterality Date  . CARDIAC CATHETERIZATION    . COLONOSCOPY    . COLONOSCOPY WITH PROPOFOL N/A 04/20/2018   Procedure: COLONOSCOPY WITH PROPOFOL;  Surgeon: Lollie Sails, MD;  Location: [facility] ENDOSCOPY;  Service: Endoscopy;  Laterality: N/A;  . FOOT SURGERY     left foot  . TONSILLECTOMY      Current Outpatient Medications  Medication Sig Dispense Refill  . dicyclomine (BENTYL) 10 MG capsule Take 10 mg by mouth 4 (four) times daily -  before meals and at bedtime.    Marland Kitchen diltiazem (TAZTIA XT) 180 MG 24 hr capsule Take 1 capsule (180 mg total) by mouth daily. 90 capsule 3  . flecainide (TAMBOCOR) 100 MG tablet Take 1 tablet (100 mg total) by mouth 2 (two) times daily. 180 tablet 3  . Mesalamine 800 MG TBEC Take by mouth 3 (three) times daily.    Marland Kitchen omeprazole (PRILOSEC) 40 MG capsule Take 40 mg by mouth every other day.     . predniSONE (STERAPRED  UNI-PAK 21 TAB) 10 MG (21) TBPK tablet Take by mouth daily.     No current facility-administered medications for this visit.     Allergies:   Biaxin [clarithromycin] and Penicillins   Social History:  The patient  reports that he has never smoked. His  smokeless tobacco use includes snuff. He reports that he drank alcohol. He reports that he does not use drugs.   Family History:   family history includes Arthritis in his unknown relative; Diabetes in his unknown relative; Stroke in his father and unknown relative.    Review of Systems: Review of Systems  Constitutional: Negative.   Respiratory: Negative.   Cardiovascular: Negative.   Gastrointestinal: Negative.   Musculoskeletal: Positive for back pain.  Neurological: Negative.   Psychiatric/Behavioral: Negative.   All other systems reviewed and are negative.   PHYSICAL EXAM: VS:  BP 130/90 (BP Location: Left Arm, Patient Position: Sitting, Cuff Size: Normal)   Pulse 62   Ht 6\' 2"  (1.88 m)   Wt (!) 319 lb (144.7 kg)   BMI 40.96 kg/m  , BMI Body mass index is 40.96 kg/m.  Constitutional:  oriented to person, place, and time. No distress.  HENT:  Head: Normocephalic and atraumatic.  Eyes:  no discharge. No scleral icterus.  Neck: Normal range of motion. Neck supple. No JVD present.  Cardiovascular: Normal rate, regular rhythm, normal heart sounds and intact distal pulses. Exam reveals no gallop and no friction rub. No edema No murmur heard. Pulmonary/Chest: Effort normal and breath sounds normal. No stridor. No respiratory distress.  no wheezes.  no rales.  no tenderness.  Abdominal: Soft.  no distension.  no tenderness.  Musculoskeletal: Normal range of motion.  no  tenderness or deformity.  Neurological:  normal muscle tone. Coordination normal. No atrophy Skin: Skin is warm and dry. No rash noted. not diaphoretic.  Psychiatric:  normal mood and affect. behavior is normal. Thought content normal.    Recent Labs: No results found for requested labs within last 8760 hours.    Lipid Panel Lab Results  Component Value Date   CHOL 126 04/24/2017   HDL 23.80 (L) 04/24/2017   LDLCALC 83 04/24/2017   TRIG 99.0 04/24/2017      Wt Readings from Last 3 Encounters:   05/25/18 (!) 319 lb (144.7 kg)  05/17/18 (!) 317 lb (143.8 kg)  04/20/18 (!) 319 lb (144.7 kg)       ASSESSMENT AND PLAN:  Atrial fibrillation, Paroxysmal - Plan: EKG 12-Lead Denies having any atrial fibrillation symptoms Discussed CHADSVASC with him, score of 1 Discuss details concerning aspirin, no stroke benefit Currently taking aspirin 325 mg with recent GI issues Recommended he stop the aspirin  Ulcerative colitis Recent bleeding diarrhea Improving on medication  Essential hypertension - Plan: EKG [id number] Diastolic pressure elevated on today's visit Recommended he monitor blood pressure at home and call our office if this continues to run high  Morbid obesity with BMI of 40.0-44.9, adult ([place]) We have encouraged continued exercise, careful diet management in an effort to lose weight. Continues to work on a farm  Ventricular ectopy Denies any ectopy concerning for PVCs or atrial fibrillation asymptomatic   Total encounter time more than 25 minutes  Greater than 50% was spent in counseling and coordination of care with the patient   Disposition:   F/U  12 months   Orders Placed This Encounter  Procedures  . EKG 12-Lead     Signed, Esmond Plants, M.D., Ph.D. 05/25/2018  [place], [place]

## 2018-05-25 ENCOUNTER — Ambulatory Visit: Payer: BLUE CROSS/BLUE SHIELD | Admitting: Cardiovascular Disease

## 2018-05-25 ENCOUNTER — Encounter: Payer: Self-pay | Admitting: Cardiovascular Disease

## 2018-05-25 VITALS — BP 130/90 | HR 62 | Ht 74.0 in | Wt 319.0 lb

## 2018-05-25 DIAGNOSIS — K51919 Ulcerative colitis, unspecified with unspecified complications: Secondary | ICD-10-CM

## 2018-05-25 DIAGNOSIS — I10 Essential (primary) hypertension: Secondary | ICD-10-CM | POA: Diagnosis not present

## 2018-05-25 DIAGNOSIS — I480 Paroxysmal atrial fibrillation: Secondary | ICD-10-CM | POA: Diagnosis not present

## 2018-05-25 DIAGNOSIS — R0789 Other chest pain: Secondary | ICD-10-CM | POA: Diagnosis not present

## 2018-05-25 DIAGNOSIS — I493 Ventricular premature depolarization: Secondary | ICD-10-CM

## 2018-05-25 DIAGNOSIS — Z6841 Body Mass Index (BMI) 40.0 and over, adult: Secondary | ICD-10-CM | POA: Diagnosis not present

## 2018-05-25 NOTE — Patient Instructions (Addendum)
Medication Instructions:   Ok to stop aspirin  Labwork:  No new labs needed  Testing/Procedures:  No further testing at this time   Follow-Up: It was a pleasure seeing you in the office today. Please call us if you have new issues that need to be addressed before your next appt.  [phone]  Your physician wants you to follow-up in: 12 months.  You will receive a reminder letter in the mail two months in advance. If you don't receive a letter, please call our office to schedule the follow-up appointment.  If you need a refill on your cardiac medications before your next appointment, please call your pharmacy.  For educational health videos Log in to : [email or web address] Or : [email or web address], password : [email or web address]

## 2018-06-21 ENCOUNTER — Encounter: Payer: Self-pay | Admitting: Family Medicine

## 2018-06-22 ENCOUNTER — Other Ambulatory Visit: Payer: Self-pay | Admitting: Family Medicine

## 2018-06-22 MED ORDER — DIAZEPAM 5 MG PO TABS: 5.0000 mg | ORAL_TABLET | Freq: Three times a day (TID) | ORAL | 0 refills | Status: DC | PRN

## 2018-06-22 NOTE — Telephone Encounter (Signed)
[place] office visit 05/17/2018.  Valium is not on current medication list.  Refill?

## 2018-08-06 ENCOUNTER — Other Ambulatory Visit: Payer: Self-pay | Admitting: Cardiovascular Disease

## 2018-08-19 ENCOUNTER — Ambulatory Visit (INDEPENDENT_AMBULATORY_CARE_PROVIDER_SITE_OTHER): Payer: BLUE CROSS/BLUE SHIELD

## 2018-08-19 DIAGNOSIS — Z23 Encounter for immunization: Secondary | ICD-10-CM

## 2018-10-13 ENCOUNTER — Other Ambulatory Visit: Payer: Self-pay | Admitting: Student

## 2018-10-13 DIAGNOSIS — M7521 Bicipital tendinitis, right shoulder: Secondary | ICD-10-CM

## 2018-10-13 DIAGNOSIS — M7581 Other shoulder lesions, right shoulder: Secondary | ICD-10-CM

## 2018-10-28 ENCOUNTER — Other Ambulatory Visit: Payer: Self-pay | Admitting: Student

## 2018-10-28 DIAGNOSIS — S0540XA Penetrating wound of orbit with or without foreign body, unspecified eye, initial encounter: Secondary | ICD-10-CM

## 2018-11-01 ENCOUNTER — Ambulatory Visit
Admission: RE | Admit: 2018-11-01 | Discharge: 2018-11-01 | Disposition: A | Discharge status: Home / Self Care | Payer: BLUE CROSS/BLUE SHIELD | Source: Ambulatory Visit | Attending: Student | Admitting: Student

## 2018-11-01 DIAGNOSIS — M7521 Bicipital tendinitis, right shoulder: Secondary | ICD-10-CM | POA: Insufficient documentation

## 2018-11-01 DIAGNOSIS — M7581 Other shoulder lesions, right shoulder: Secondary | ICD-10-CM

## 2018-11-01 DIAGNOSIS — S0540XA Penetrating wound of orbit with or without foreign body, unspecified eye, initial encounter: Secondary | ICD-10-CM | POA: Insufficient documentation

## 2018-12-03 ENCOUNTER — Encounter
Admission: RE | Admit: 2018-12-03 | Discharge: 2018-12-03 | Disposition: A | Discharge status: Home / Self Care | Payer: PRIVATE HEALTH INSURANCE | Source: Ambulatory Visit | Attending: Surgery | Admitting: Surgery

## 2018-12-03 ENCOUNTER — Other Ambulatory Visit: Payer: Self-pay

## 2018-12-03 DIAGNOSIS — I4891 Unspecified atrial fibrillation: Secondary | ICD-10-CM | POA: Diagnosis not present

## 2018-12-03 DIAGNOSIS — Z01818 Encounter for other preprocedural examination: Secondary | ICD-10-CM | POA: Insufficient documentation

## 2018-12-03 HISTORY — DX: Gastro-esophageal reflux disease without esophagitis: K21.9

## 2018-12-03 LAB — BASIC METABOLIC PANEL
ANION GAP: 9 (ref 5–15)
BUN: 14 mg/dL (ref 6–20)
CALCIUM: 9.1 mg/dL (ref 8.9–10.3)
CO2: 23 mmol/L (ref 22–32)
CREATININE: 0.79 mg/dL (ref 0.61–1.24)
Chloride: 103 mmol/L (ref 98–111)
GLUCOSE: 97 mg/dL (ref 70–99)
Potassium: 3.7 mmol/L (ref 3.5–5.1)
Sodium: 135 mmol/L (ref 135–145)

## 2018-12-03 NOTE — Patient Instructions (Signed)
Your procedure is scheduled on: Tues 12/14/18 Report to Day Surgery. To find out your arrival time please call [phone] between 1PM - 3PM on Mon. 12/13/18.  Remember: Instructions that are not followed completely may result in serious medical risk,  up to and including death, or upon the discretion of your surgeon and anesthesiologist your  surgery may need to be rescheduled.     _X__ 1. Do not eat food after midnight the night before your procedure.                 No gum chewing or hard candies. You may drink clear liquids up to 2 hours                 before you are scheduled to arrive for your surgery- DO not drink clear                 liquids within 2 hours of the start of your surgery.                 Clear Liquids include:  water, apple juice without pulp, clear carbohydrate                 drink such as Clearfast of Gatorade, Black Coffee or Tea (Do not add                 anything to coffee or tea).  __X__2.  On the morning of surgery brush your teeth with toothpaste and water, you                may rinse your mouth with mouthwash if you wish.  Do not swallow any toothpaste of mouthwash.     _X__ 3.  No Alcohol for 24 hours before or after surgery.   _X__ 4.  Do Not Smoke or use e-cigarettes For 24 Hours Prior to Your Surgery.                 Do not use any chewable tobacco products for at least 6 hours prior to                 surgery.  ____  5.  Bring all medications with you on the day of surgery if instructed.   __x__  6.  Notify your doctor if there is any change in your medical condition      (cold, fever, infections).     Do not wear jewelry, make-up, hairpins, clips or nail polish. Do not wear lotions, powders, or perfumes. You may wear deodorant. Do not shave 48 hours prior to surgery. Men may shave face and neck. Do not bring valuables to the hospital.    [facility] is not responsible for any belongings or valuables.  Contacts,  dentures or bridgework may not be worn into surgery. Leave your suitcase in the car. After surgery it may be brought to your room. For patients admitted to the hospital, discharge time is determined by your treatment team.   Patients discharged the day of surgery will not be allowed to drive home.   Please read over the following fact sheets that you were given:     __x__ Take these medicines the morning of surgery with A SIP OF WATER:    1. diltiazem (CARDIZEM CD) 180 MG 24 hr capsule  2. flecainide (TAMBOCOR) 100 MG tablet  3. omeprazole (PRILOSEC) 40 MG capsule take dose the night before and one morning of surgery  4.  5.  6.  ____ Fleet Enema (as directed)   __x__ Use CHG Soap as directed  ____ Use inhalers on the day of surgery  ____ Stop metformin 2 days prior to surgery    ____ Take 1/2 of usual insulin dose the night before surgery. No insulin the morning          of surgery.   ____ Stop Coumadin/Plavix/aspirin on   ____ Stop Anti-inflammatories on    ____ Stop supplements until after surgery.    ____ Bring C-Pap to the hospital.

## 2018-12-13 MED ORDER — CLINDAMYCIN PHOSPHATE 900 MG/50ML IV SOLN
900.0000 mg | Freq: Once | INTRAVENOUS | Status: AC
  Administered 2018-12-14: 900 mg via INTRAVENOUS

## 2018-12-14 ENCOUNTER — Ambulatory Visit: Payer: PRIVATE HEALTH INSURANCE | Admitting: Anesthesiology

## 2018-12-14 ENCOUNTER — Encounter
Admission: RE | Disposition: A | Discharge status: Home / Self Care | Payer: Self-pay | Source: Home / Self Care | Attending: Surgery

## 2018-12-14 ENCOUNTER — Ambulatory Visit
Admission: RE | Admit: 2018-12-14 | Discharge: 2018-12-14 | Disposition: A | Discharge status: Home / Self Care | Payer: PRIVATE HEALTH INSURANCE | Attending: Surgery | Admitting: Surgery

## 2018-12-14 ENCOUNTER — Encounter: Payer: Self-pay | Admitting: *Deleted

## 2018-12-14 ENCOUNTER — Other Ambulatory Visit: Payer: Self-pay

## 2018-12-14 DIAGNOSIS — M19011 Primary osteoarthritis, right shoulder: Secondary | ICD-10-CM | POA: Insufficient documentation

## 2018-12-14 DIAGNOSIS — I10 Essential (primary) hypertension: Secondary | ICD-10-CM | POA: Insufficient documentation

## 2018-12-14 DIAGNOSIS — Z6841 Body Mass Index (BMI) 40.0 and over, adult: Secondary | ICD-10-CM | POA: Insufficient documentation

## 2018-12-14 DIAGNOSIS — M7521 Bicipital tendinitis, right shoulder: Secondary | ICD-10-CM | POA: Diagnosis not present

## 2018-12-14 DIAGNOSIS — K219 Gastro-esophageal reflux disease without esophagitis: Secondary | ICD-10-CM | POA: Diagnosis not present

## 2018-12-14 DIAGNOSIS — Z880 Allergy status to penicillin: Secondary | ICD-10-CM | POA: Diagnosis not present

## 2018-12-14 DIAGNOSIS — I480 Paroxysmal atrial fibrillation: Secondary | ICD-10-CM | POA: Insufficient documentation

## 2018-12-14 DIAGNOSIS — Z881 Allergy status to other antibiotic agents status: Secondary | ICD-10-CM | POA: Diagnosis not present

## 2018-12-14 DIAGNOSIS — M75111 Incomplete rotator cuff tear or rupture of right shoulder, not specified as traumatic: Secondary | ICD-10-CM | POA: Diagnosis present

## 2018-12-14 DIAGNOSIS — Z79899 Other long term (current) drug therapy: Secondary | ICD-10-CM | POA: Diagnosis not present

## 2018-12-14 HISTORY — PX: SHOULDER ARTHROSCOPY WITH SUBACROMIAL DECOMPRESSION AND BICEP TENDON REPAIR: SHX5689

## 2018-12-14 SURGERY — SHOULDER ARTHROSCOPY WITH SUBACROMIAL DECOMPRESSION AND BICEP TENDON REPAIR
Anesthesia: General | Laterality: Right

## 2018-12-14 MED ORDER — ONDANSETRON HCL 4 MG/2ML IJ SOLN
INTRAMUSCULAR | Status: DC | PRN
  Administered 2018-12-14: 4 mg via INTRAVENOUS

## 2018-12-14 MED ORDER — SODIUM CHLORIDE 0.9 % IV SOLN
INTRAVENOUS | Status: DC | PRN
  Administered 2018-12-14: 100 ug/min via INTRAVENOUS

## 2018-12-14 MED ORDER — ROCURONIUM BROMIDE 100 MG/10ML IV SOLN
INTRAVENOUS | Status: DC | PRN
  Administered 2018-12-14: 50 mg via INTRAVENOUS
  Administered 2018-12-14: 20 mg via INTRAVENOUS

## 2018-12-14 MED ORDER — CLINDAMYCIN PHOSPHATE 900 MG/50ML IV SOLN
INTRAVENOUS | Status: AC
  Filled 2018-12-14: qty 50

## 2018-12-14 MED ORDER — ACETAMINOPHEN 10 MG/ML IV SOLN
INTRAVENOUS | Status: DC | PRN
  Administered 2018-12-14: 1000 mg via INTRAVENOUS

## 2018-12-14 MED ORDER — FENTANYL CITRATE (PF) 100 MCG/2ML IJ SOLN
25.0000 ug | INTRAMUSCULAR | Status: DC | PRN
  Administered 2018-12-14 (×2): 25 ug via INTRAVENOUS

## 2018-12-14 MED ORDER — MIDAZOLAM HCL 2 MG/2ML IJ SOLN
INTRAMUSCULAR | Status: AC
  Filled 2018-12-14: qty 2

## 2018-12-14 MED ORDER — ONDANSETRON HCL 4 MG/2ML IJ SOLN: 4.0000 mg | Freq: Once | INTRAMUSCULAR | Status: DC | PRN

## 2018-12-14 MED ORDER — PHENYLEPHRINE HCL 10 MG/ML IJ SOLN
INTRAMUSCULAR | Status: DC | PRN
  Administered 2018-12-14: 200 ug via INTRAVENOUS
  Administered 2018-12-14 (×3): 100 ug via INTRAVENOUS

## 2018-12-14 MED ORDER — OXYCODONE HCL 5 MG PO TABS: 5.0000 mg | ORAL_TABLET | ORAL | 0 refills | Status: DC | PRN

## 2018-12-14 MED ORDER — ONDANSETRON HCL 4 MG/2ML IJ SOLN
INTRAMUSCULAR | Status: AC
  Filled 2018-12-14: qty 2

## 2018-12-14 MED ORDER — DEXAMETHASONE SODIUM PHOSPHATE 10 MG/ML IJ SOLN
INTRAMUSCULAR | Status: DC | PRN
  Administered 2018-12-14: 8 mg via INTRAVENOUS

## 2018-12-14 MED ORDER — ONDANSETRON HCL 4 MG/2ML IJ SOLN: 4.0000 mg | Freq: Four times a day (QID) | INTRAMUSCULAR | Status: DC | PRN

## 2018-12-14 MED ORDER — EPHEDRINE SULFATE 50 MG/ML IJ SOLN
INTRAMUSCULAR | Status: AC
  Filled 2018-12-14: qty 1

## 2018-12-14 MED ORDER — BUPIVACAINE-EPINEPHRINE 0.5% -1:200000 IJ SOLN
INTRAMUSCULAR | Status: DC | PRN
  Administered 2018-12-14: 30 mL

## 2018-12-14 MED ORDER — OXYCODONE HCL 5 MG PO TABS: 5.0000 mg | ORAL_TABLET | ORAL | Status: DC | PRN

## 2018-12-14 MED ORDER — FENTANYL CITRATE (PF) 100 MCG/2ML IJ SOLN
INTRAMUSCULAR | Status: AC
  Administered 2018-12-14: 25 ug via INTRAVENOUS
  Filled 2018-12-14: qty 2

## 2018-12-14 MED ORDER — LIDOCAINE HCL (PF) 2 % IJ SOLN
INTRAMUSCULAR | Status: AC
  Filled 2018-12-14: qty 10

## 2018-12-14 MED ORDER — LIDOCAINE HCL (PF) 1 % IJ SOLN
INTRAMUSCULAR | Status: DC | PRN
  Administered 2018-12-14: 3 mL via INTRADERMAL

## 2018-12-14 MED ORDER — LACTATED RINGERS IV SOLN
INTRAVENOUS | Status: DC
  Administered 2018-12-14: 14:00:00 via INTRAVENOUS

## 2018-12-14 MED ORDER — MIDAZOLAM HCL 2 MG/2ML IJ SOLN
INTRAMUSCULAR | Status: DC | PRN
  Administered 2018-12-14: 2 mg via INTRAVENOUS

## 2018-12-14 MED ORDER — EPHEDRINE SULFATE 50 MG/ML IJ SOLN
INTRAMUSCULAR | Status: DC | PRN
  Administered 2018-12-14 (×4): 5 mg via INTRAVENOUS

## 2018-12-14 MED ORDER — SUCCINYLCHOLINE CHLORIDE 20 MG/ML IJ SOLN
INTRAMUSCULAR | Status: DC | PRN
  Administered 2018-12-14: 140 mg via INTRAVENOUS

## 2018-12-14 MED ORDER — SUGAMMADEX SODIUM 200 MG/2ML IV SOLN
INTRAVENOUS | Status: DC | PRN
  Administered 2018-12-14: 300 mg via INTRAVENOUS

## 2018-12-14 MED ORDER — LACTATED RINGERS IV SOLN
INTRAVENOUS | Status: DC | PRN
  Administered 2018-12-14: 3 mL

## 2018-12-14 MED ORDER — PROPOFOL 10 MG/ML IV BOLUS
INTRAVENOUS | Status: DC | PRN
  Administered 2018-12-14: 200 mg via INTRAVENOUS

## 2018-12-14 MED ORDER — BUPIVACAINE HCL (PF) 0.5 % IJ SOLN
INTRAMUSCULAR | Status: AC
  Filled 2018-12-14: qty 30

## 2018-12-14 MED ORDER — FENTANYL CITRATE (PF) 100 MCG/2ML IJ SOLN
50.0000 ug | Freq: Once | INTRAMUSCULAR | Status: AC
  Administered 2018-12-14: 50 ug via INTRAVENOUS

## 2018-12-14 MED ORDER — MIDAZOLAM HCL 2 MG/2ML IJ SOLN
1.0000 mg | Freq: Once | INTRAMUSCULAR | Status: AC
  Administered 2018-12-14: 1 mg via INTRAVENOUS

## 2018-12-14 MED ORDER — EPINEPHRINE PF 1 MG/ML IJ SOLN
INTRAMUSCULAR | Status: AC
  Filled 2018-12-14: qty 1

## 2018-12-14 MED ORDER — ROCURONIUM BROMIDE 50 MG/5ML IV SOLN
INTRAVENOUS | Status: AC
  Filled 2018-12-14: qty 1

## 2018-12-14 MED ORDER — BUPIVACAINE HCL (PF) 0.5 % IJ SOLN
INTRAMUSCULAR | Status: AC
  Filled 2018-12-14: qty 10

## 2018-12-14 MED ORDER — ACETAMINOPHEN 10 MG/ML IV SOLN
INTRAVENOUS | Status: AC
  Filled 2018-12-14: qty 100

## 2018-12-14 MED ORDER — SUGAMMADEX SODIUM 200 MG/2ML IV SOLN
INTRAVENOUS | Status: AC
  Filled 2018-12-14: qty 2

## 2018-12-14 MED ORDER — BUPIVACAINE LIPOSOME 1.3 % IJ SUSP
INTRAMUSCULAR | Status: DC | PRN
  Administered 2018-12-14: 20 mL via PERINEURAL

## 2018-12-14 MED ORDER — METOCLOPRAMIDE HCL 10 MG PO TABS: 5.0000 mg | ORAL_TABLET | Freq: Three times a day (TID) | ORAL | Status: DC | PRN

## 2018-12-14 MED ORDER — POTASSIUM CHLORIDE IN NACL 20-0.9 MEQ/L-% IV SOLN
INTRAVENOUS | Status: DC
  Filled 2018-12-14: qty 1000

## 2018-12-14 MED ORDER — DEXAMETHASONE SODIUM PHOSPHATE 10 MG/ML IJ SOLN
INTRAMUSCULAR | Status: AC
  Filled 2018-12-14: qty 1

## 2018-12-14 MED ORDER — BUPIVACAINE HCL (PF) 0.5 % IJ SOLN
INTRAMUSCULAR | Status: DC | PRN
  Administered 2018-12-14: 10 mL via PERINEURAL

## 2018-12-14 MED ORDER — BUPIVACAINE LIPOSOME 1.3 % IJ SUSP
INTRAMUSCULAR | Status: AC
  Filled 2018-12-14: qty 20

## 2018-12-14 MED ORDER — FENTANYL CITRATE (PF) 100 MCG/2ML IJ SOLN
INTRAMUSCULAR | Status: DC | PRN
  Administered 2018-12-14: 50 ug via INTRAVENOUS

## 2018-12-14 MED ORDER — LIDOCAINE HCL (PF) 1 % IJ SOLN
INTRAMUSCULAR | Status: AC
  Filled 2018-12-14: qty 5

## 2018-12-14 MED ORDER — METOCLOPRAMIDE HCL 5 MG/ML IJ SOLN: 5.0000 mg | Freq: Three times a day (TID) | INTRAMUSCULAR | Status: DC | PRN

## 2018-12-14 MED ORDER — EPINEPHRINE PF 1 MG/ML IJ SOLN
INTRAMUSCULAR | Status: AC
  Filled 2018-12-14: qty 3

## 2018-12-14 MED ORDER — LIDOCAINE HCL (CARDIAC) PF 100 MG/5ML IV SOSY
PREFILLED_SYRINGE | INTRAVENOUS | Status: DC | PRN
  Administered 2018-12-14: 60 mg via INTRAVENOUS

## 2018-12-14 MED ORDER — FENTANYL CITRATE (PF) 100 MCG/2ML IJ SOLN
INTRAMUSCULAR | Status: AC
  Filled 2018-12-14: qty 2

## 2018-12-14 MED ORDER — ONDANSETRON HCL 4 MG PO TABS: 4.0000 mg | ORAL_TABLET | Freq: Four times a day (QID) | ORAL | Status: DC | PRN

## 2018-12-14 SURGICAL SUPPLY — 49 items
ANCHOR BONE REGENETEN (Anchor) ×1 IMPLANT
ANCHOR JUGGERKNOT WTAP NDL 2.9 (Anchor) ×2 IMPLANT
ANCHOR SUT QUATTRO KNTLS 4.5 (Anchor) ×1 IMPLANT
ANCHOR SUT W/ ORTHOCORD (Anchor) ×1 IMPLANT
ANCHOR TENDON REGENETEN (Staple) ×1 IMPLANT
BIT DRILL JUGRKNT W/NDL BIT2.9 (DRILL) IMPLANT
BLADE FULL RADIUS 3.5 (BLADE) ×2 IMPLANT
BUR ACROMIONIZER 4.0 (BURR) ×2 IMPLANT
CANNULA SHAVER 8MMX76MM (CANNULA) ×3 IMPLANT
CHLORAPREP W/TINT 26ML (MISCELLANEOUS) ×2 IMPLANT
COVER MAYO STAND STRL (DRAPES) ×2 IMPLANT
COVER WAND RF STERILE (DRAPES) ×1 IMPLANT
DRAPE IMP U-DRAPE 54X76 (DRAPES) ×4 IMPLANT
DRILL JUGGERKNOT W/NDL BIT 2.9 (DRILL) ×2
ELECT REM PT RETURN 9FT ADLT (ELECTROSURGICAL) ×2
ELECTRODE REM PT RTRN 9FT ADLT (ELECTROSURGICAL) ×1 IMPLANT
GAUZE PETRO XEROFOAM 1X8 (MISCELLANEOUS) ×2 IMPLANT
GAUZE SPONGE 4X4 12PLY STRL (GAUZE/BANDAGES/DRESSINGS) ×2 IMPLANT
GLOVE BIO SURGEON STRL SZ7.5 (GLOVE) ×4 IMPLANT
GLOVE BIO SURGEON STRL SZ8 (GLOVE) ×4 IMPLANT
GLOVE BIOGEL PI IND STRL 8 (GLOVE) ×1 IMPLANT
GLOVE BIOGEL PI INDICATOR 8 (GLOVE) ×1
GLOVE INDICATOR 8.0 STRL GRN (GLOVE) ×2 IMPLANT
GOWN STRL REUS W/ TWL LRG LVL3 (GOWN DISPOSABLE) ×1 IMPLANT
GOWN STRL REUS W/ TWL XL LVL3 (GOWN DISPOSABLE) ×1 IMPLANT
GOWN STRL REUS W/TWL LRG LVL3 (GOWN DISPOSABLE) ×1
GOWN STRL REUS W/TWL XL LVL3 (GOWN DISPOSABLE) ×1
GRASPER SUT 15 45D LOW PRO (SUTURE) ×1 IMPLANT
IMPL REGENETEN MEDIUM (Shoulder) IMPLANT
IMPLANT REGENETEN MEDIUM (Shoulder) ×2 IMPLANT
IV LACTATED RINGER IRRG 3000ML (IV SOLUTION) ×3
IV LR IRRIG 3000ML ARTHROMATIC (IV SOLUTION) ×2 IMPLANT
MANIFOLD NEPTUNE II (INSTRUMENTS) ×2 IMPLANT
MASK FACE SPIDER DISP (MASK) ×2 IMPLANT
MAT ABSORB  FLUID 56X50 GRAY (MISCELLANEOUS) ×1
MAT ABSORB FLUID 56X50 GRAY (MISCELLANEOUS) ×1 IMPLANT
PACK ARTHROSCOPY SHOULDER (MISCELLANEOUS) ×2 IMPLANT
SLING ARM LRG DEEP (SOFTGOODS) ×1 IMPLANT
SLING ULTRA II LG (MISCELLANEOUS) ×2 IMPLANT
STAPLER SKIN PROX 35W (STAPLE) ×2 IMPLANT
STRAP SAFETY 5IN WIDE (MISCELLANEOUS) ×2 IMPLANT
SUT ETHIBOND 0 MO6 C/R (SUTURE) ×2 IMPLANT
SUT VIC AB 2-0 CT1 (SUTURE) ×1 IMPLANT
SUT VIC AB 2-0 CT1 27 (SUTURE) ×2
SUT VIC AB 2-0 CT1 TAPERPNT 27 (SUTURE) ×2 IMPLANT
TAPE MICROFOAM 4IN (TAPE) ×2 IMPLANT
TUBING ARTHRO INFLOW-ONLY STRL (TUBING) ×2 IMPLANT
TUBING CONNECTING 10 (TUBING) ×2 IMPLANT
WAND WEREWOLF FLOW 90D (MISCELLANEOUS) ×2 IMPLANT

## 2018-12-14 NOTE — H&P (Signed)
Paper H&P to be scanned into permanent record. H&P reviewed and patient re-examined. No changes. 

## 2018-12-14 NOTE — Anesthesia Post-op Follow-up Note (Signed)
Anesthesia QCDR form completed.        

## 2018-12-14 NOTE — Anesthesia Preprocedure Evaluation (Addendum)
Anesthesia Evaluation  Patient identified by MRN, date of birth, ID band Patient awake    Reviewed: Allergy & Precautions, H&P , NPO status , Patient's Chart, lab work & pertinent test results, reviewed documented beta blocker date and time   Airway Mallampati: II  TM Distance: >3 FB Neck ROM: full    Dental  (+) Teeth Intact   Pulmonary neg pulmonary ROS,    Pulmonary exam normal        Cardiovascular Exercise Tolerance: Good hypertension, On Medications negative cardio ROS Normal cardiovascular exam+ dysrhythmias  Rhythm:regular Rate:Normal     Neuro/Psych  Headaches, PSYCHIATRIC DISORDERS Depression  Neuromuscular disease negative neurological ROS  negative psych ROS   GI/Hepatic negative GI ROS, Neg liver ROS, PUD, GERD  ,  Endo/Other  Morbid obesity  Renal/GU negative Renal ROS  negative genitourinary   Musculoskeletal   Abdominal   Peds  Hematology negative hematology ROS (+)   Anesthesia Other Findings Past Medical History: 06/07/2009: ARTHRITIS No date: Atrial fibrillation ([place]) 06/07/2009: ATRIAL FIBRILLATION, PAROXYSMAL 10/05/2013: Cervical disc disorder with radiculopathy of cervical  region 06/07/2009: CHICKENPOX, HX OF 03/03/2012: Classical migraine No date: Colon polyp 06/07/2009: DEPRESSION 06/07/2009: DIVERTICULITIS, COLON No date: Dysrhythmia     Comment:  a-fib No date: GERD (gastroesophageal reflux disease) No date: Hematuria 04/28/2018: Ulcerative colitis ([place]) Past Surgical History: No date: CARDIAC CATHETERIZATION No date: COLONOSCOPY 04/20/2018: COLONOSCOPY WITH PROPOFOL; N/A     Comment:  Procedure: COLONOSCOPY WITH PROPOFOL;  Surgeon:               Lollie Sails, MD;  Location: [facility] ENDOSCOPY;                Service: Endoscopy;  Laterality: N/A; No date: FOOT SURGERY     Comment:  left foot No date: TONSILLECTOMY   Reproductive/Obstetrics negative OB ROS                             Anesthesia Physical Anesthesia Plan  ASA: III  Anesthesia Plan: General ETT   Post-op Pain Management:  Regional for Post-op pain   Induction:   PONV Risk Score and Plan:   Airway Management Planned:   Additional Equipment:   Intra-op Plan:   Post-operative Plan:   Informed Consent: I have reviewed the patients History and Physical, chart, labs and discussed the procedure including the risks, benefits and alternatives for the proposed anesthesia with the patient or authorized representative who has indicated his/her understanding and acceptance.     Dental Advisory Given  Plan Discussed with: CRNA  Anesthesia Plan Comments:         Anesthesia Quick Evaluation

## 2018-12-14 NOTE — Anesthesia Procedure Notes (Signed)
Procedure Name: Intubation Date/Time: 12/14/2018 2:00 PM Performed by: Lowry Bowl, CRNA Pre-anesthesia Checklist: Patient identified, Emergency Drugs available, Suction available and Patient being monitored Patient Re-evaluated:Patient Re-evaluated prior to induction Oxygen Delivery Method: Circle system utilized Preoxygenation: Pre-oxygenation with 100% oxygen Induction Type: IV induction, Cricoid Pressure applied and Rapid sequence Ventilation: Mask ventilation without difficulty Laryngoscope Size: McGraph and 4 Grade View: Grade I Tube type: Oral Tube size: 8.0 mm Number of attempts: 1 Airway Equipment and Method: Stylet Placement Confirmation: ETT inserted through vocal cords under direct vision,  positive ETCO2 and breath sounds checked- equal and bilateral Secured at: 23 cm Tube secured with: Tape Dental Injury: Teeth and Oropharynx as per pre-operative assessment

## 2018-12-14 NOTE — Anesthesia Procedure Notes (Signed)
Anesthesia Regional Block: Interscalene brachial plexus block   Pre-Anesthetic Checklist: ,, timeout performed, Correct Patient, Correct Site, Correct Laterality, Correct Procedure, Correct Position, site marked, Risks and benefits discussed,  Surgical consent,  Pre-op evaluation,  At surgeon's request and post-op pain management  Laterality: Right  Prep: chloraprep       Needles:  Injection technique: Single-shot  Needle Type: Echogenic Stimulator Needle     Needle Length: 10cm  Needle Gauge: 20     Additional Needles:   Procedures:, nerve stimulator,,, ultrasound used (permanent image in chart),,,,   Nerve Stimulator or Paresthesia:  Response: biceps flexion,   Additional Responses:   Narrative:  Injection made incrementally with aspirations every 5 mL.  Performed by: Personally  Anesthesiologist: Molli Barrows, MD  Additional Notes: Functioning IV was confirmed and monitors were applied.   Sterile prep and drape,hand hygiene and sterile gloves were used.  Negative aspiration and negative test dose prior to incremental administration of local anesthetic. The patient tolerated the procedure well.

## 2018-12-14 NOTE — Transfer of Care (Signed)
Immediate Anesthesia Transfer of Care Note  Patient: Brandon [name]  Procedure(s) Performed: SHOULDER ARTHROSCOPY WITH EBRIDEMENT, DECOMPRESSION, DISTAL CLAVICLE EXCISION, BICEP TENODESIS AND DEBRIDEMENT VERSUS REPAIR OF ROTATOR CUFF TEAR (Right )  Patient Location: PACU  Anesthesia Type:General  Level of Consciousness: awake and sedated  Airway & Oxygen Therapy: Patient Spontanous Breathing and Patient connected to face mask oxygen  Post-op Assessment: Report given to RN and Post -op Vital signs reviewed and stable  Post vital signs: Reviewed and stable  Last Vitals:  Vitals Value Taken Time  BP    Temp    Pulse 75 12/14/2018  4:13 PM  Resp 20 12/14/2018  4:13 PM  SpO2 93 % 12/14/2018  4:13 PM  Vitals shown include unvalidated device data.  Last Pain:  Vitals:   12/14/18 1147  TempSrc: Tympanic  PainSc: 4          Complications: No apparent anesthesia complications

## 2018-12-14 NOTE — Op Note (Signed)
12/14/2018  4:02 PM  Patient:   Brandon [name]  Pre-Op Diagnosis:   Nontraumatic partial-thickness rotator cuff tears, biceps tendinopathy, and degenerative joint disease of AC joint, right shoulder.  Post-Op Diagnosis:   Nontraumatic partial-thickness rotator cuff tears, biceps tendinopathy, degenerative joint disease of AC joint, and degenerative labral fraying, right shoulder.  Procedure:   Extensive arthroscopic debridement, arthroscopic repair of subscapularis tendon tear, subacromial decompression, arthroscopic distal clavicle excision, mini-open rotator cuff repair, and mini-open biceps tenodesis, right shoulder.  Anesthesia:   General endotracheal with interscalene block using Exparel placed preoperatively by the anesthesiologist.  Surgeon:   Pascal Lux, MD  Assistant:   Cameron Proud, PA-C  Findings:   As above. There was an articular-sided partial-thickness tear involving the anterior insertional fibers of the supraspinatus tendon and a second articular-sided partial-thickness tear involving the superior insertional fibers of the subscapularis tendon. The remainder of the rotator cuff was in satisfactory condition. There was evidence of biceps tendinopathy as well as fraying of the labrum superiorly and anteriorly without frank detachment from the glenoid. The articular surfaces of the glenoid and humerus both were in excellent condition.  Complications:   None  Fluids:   700 cc  Estimated blood loss:   10 cc  Tourniquet time:   None  Drains:   None  Closure:   Staples      Brief clinical note:   The patient is a 53 year old male with a history of right shoulder pain. The patient's symptoms have progressed despite medications, activity modification, etc. The patient's history and examination are consistent with impingement/tendinopathy with a rotator cuff tear. The patient's preoperative MRI scan demonstrated the presence of partial-thickness tears of both the  subscapularis and supraspinatus tendons, as well as biceps tendinopathy and degenerative joint disease of the AC joint. The patient presents at this time for definitive management of these shoulder symptoms.  Procedure:   The patient underwent placement of an interscalene block using Exparel by the anesthesiologist in the preoperative holding area before being brought into the operating room and lain in the supine position. The patient then underwent general endotracheal intubation and anesthesia before being repositioned in the beach chair position using the beach chair positioner. The right shoulder and upper extremity were prepped with ChloraPrep solution before being draped sterilely. Preoperative antibiotics were administered. A timeout was performed to confirm the proper surgical site before the expected portal sites and incision site were injected with 0.5% Sensorcaine with epinephrine. A posterior portal was created and the glenohumeral joint thoroughly inspected with the findings as described above. An anterior portal was created using an outside-in technique. The labrum and rotator cuff were further probed, again confirming the above-noted findings.  The areas of degenerative fraying involving the anterior and superior portion of the labrum were debrided back to stable margins, as were the frayed margins of the rotator cuff tears and areas of synovitis anteriorly, superiorly, and postero-superiorly. The ArthroCare wand was inserted and used to release the biceps tendon from its labral anchor. It also was used to obtain hemostasis as well as to "anneal" the labrum superiorly and anteriorly. The instruments were removed from the joint after suctioning the excess fluid.  The camera was repositioned through the posterior portal into the subacromial space. A separate lateral portal was created using an outside-in technique. The 3.5 mm full-radius resector was introduced and used to perform a subtotal  bursectomy. The ArthroCare wand was then inserted and used to remove the periosteal tissue off  the undersurface of the anterior third of the acromion as well as to recess the coracoacromial ligament from its attachment along the anterior and lateral margins of the acromion. The 4.0 mm acromionizing bur was introduced and used to complete the decompression by removing the undersurface of the anterior third of the acromion. The full radius resector was reintroduced to remove any residual bony debris before the ArthroCare wand was reintroduced to obtain hemostasis.  The camera was repositioned in the lateral portal and the ArthroCare wand inserted through the anterior portal. The inferior capsular tissues were denuded from the inferior portion of the [facility] joint, skeletonizing the inferior part of the distal clavicle. The 4.0 mm acromionizing bur was introduced through the anterior portal and used to remove the distal 8 to 10 mm of the distal clavicle. Care was taken to protect the superior and posterior AC ligaments. The instruments were then removed from the subacromial space after suctioning the excess fluid.  An approximately 4-5 cm incision was made over the anterolateral aspect of the shoulder beginning at the anterolateral corner of the acromion and extending distally in line with the bicipital groove. This incision was carried down through the subcutaneous tissues to expose the deltoid fascia. The raphae between the anterior and middle thirds was identified and this plane developed to provide access into the subacromial space. Additional bursal tissues were debrided sharply using Metzenbaum scissors. The rotator cuff was carefully inspected. There was an area of thinning/softening involving the anterior insertional fibers of the supraspinatus tendon by palpation. This area was incised to complete the tear. The margins were debrided sharply with a #15 blade and the exposed greater tuberosity roughened with a  rongeur. The tear was repaired using a single Biomet 2.9 mm JuggerKnot anchor. These sutures were then brought back laterally and secured using one [place] QuatroLink anchor to create a two-layer closure. An apparent watertight closure was obtained.  The bicipital groove was identified by palpation and opened for 1-1.5 cm. The biceps tendon stump was retrieved through this defect. The floor of the bicipital groove was roughened with a curet before another Biomet 2.9 mm JuggerKnot anchor was inserted. Both sets of sutures were passed through the biceps tendon and tied securely to effect the tenodesis. The bicipital sheath was reapproximated using two #0 Ethibond interrupted sutures, incorporating the biceps tendon to further reinforce the tenodesis.  The wound was copiously irrigated with sterile saline solution before the deltoid raphae was reapproximated using 2-0 Vicryl interrupted sutures. The subcutaneous tissues were closed in two layers using 2-0 Vicryl interrupted sutures before the skin was closed using staples. The portal sites also were closed using staples. A sterile bulky dressing was applied to the shoulder before the arm was placed into a shoulder immobilizer. The patient was then awakened, extubated, and returned to the recovery room in satisfactory condition after tolerating the procedure well.

## 2018-12-14 NOTE — Discharge Instructions (Addendum)
Interscalene Nerve Block An interscalene nerve block is an injection of numbing medicine into a group of nerves in your neck called the brachial plexus. These nerves control feeling and movement in your shoulder and upper arm. You may have this procedure to: Numb your shoulder and upper arm during a surgical procedure. Help relieve pain in your shoulder or upper arm after surgery. Decrease the amount of anesthetic medicine you need during surgery. Decrease the amount of pain medicine you need after surgery. Compared to having general anesthetic alone, the benefits of having this procedure include: Better control of pain after surgery. A faster recovery with fewer side effects. Tell a health care provider about: Any allergies you have. All medicines you are taking, including vitamins, herbs, eye drops, creams, and over-the-counter medicines. Types of surgeries and anesthetics you have had in the past. Any problems you or family members have had with anesthetic medicines. Any blood disorders you have. Any surgeries you have had. Any medical conditions you have. Whether you are pregnant or may be pregnant. Whether you smoke, drink alcohol, use marijuana, or use street drugs. What are the risks? Generally, this is a safe procedure. However, problems may occur, including: Damage to nerves, blood vessels, or other structures of the neck. Bleeding. Infection. Numbness, tingling, or weakness of the shoulder or arm. Numbing of other nerves near the brachial plexus. This may cause hoarseness, eye droop, lip droop, dry eye, redness in the eye, shortness of breath, or chest tightness. Lung damage that causes the lung to collapse (pneumothorax). Reaction to medicines, such as seizures or heart problems. What happens before the procedure? Staying hydrated Follow instructions from your health care provider about hydration, which may include: Up to 2 hours before the procedure - you may continue to  drink clear liquids, such as water, clear fruit juice, black coffee, and plain tea. Eating and drinking restrictions Follow instructions from your health care provider about eating and drinking, which may include: 8 hours before the procedure - stop eating heavy meals or foods such as meat, fried foods, or fatty foods. 6 hours before the procedure - stop eating light meals or foods, such as toast or cereal. 6 hours before the procedure - stop drinking milk or drinks that contain milk. 2 hours before the procedure - stop drinking clear liquids. Medicines Ask your health care provider about: Changing or stopping your regular medicines. This is especially important if you are taking diabetes medicines or blood thinners. Taking medicines such as aspirin and ibuprofen. These medicines can thin your blood. Do not take these medicines before your procedure if your health care provider instructs you not to. General instructions Ask if you will be going home the same day as the procedure or the next day. If you will be able to go home soon after the procedure: Plan to have someone take you home. Plan to have someone stay with you for the first 24 hours after the procedure. For 3-6 weeks before the procedure, try not to use any products that contain nicotine or tobacco, such as cigarettes and e-cigarettes. You may brush your teeth on the morning of the procedure, but make sure to spit out the toothpaste. What happens during the procedure? To reduce your risk of infection: Your health care team will wash or sanitize their hands. Your skin will be cleaned with soap. An IV tube will be placed into one of your veins, probably in the arm that is not on the same side as the  block. You will be given one or more of the following medicines: A medicine to help you relax (sedative). A medicine to make you fall asleep (general anesthetic). A medicine to help decrease pain (analgesic). You will be asked to rest  on your back, usually with your head turned slightly to the side. Your health care provider will feel your neck above your collarbone to locate the muscles over your brachial plexus. A medicine will be injected in the skin of your neck to numb the skin where the block will be placed (local anesthetic). A longer needle will be inserted through your skin towardthe brachial plexus. Ultrasound imaging will be used to help locate your brachial plexus. A needle with an electrical current may be used to stimulate and identify the correct nerves. This may cause the muscles in your shoulder or arm to twitch. The numbing medicine for the brachial plexus will be injected. The needle will be removed. If you have a continuous block, a thin, flexible tube (catheter) may be left in place to deliver medicine to the nerves continuously, often for 1-2 days. A clear bandage (dressing) may be placed over the needle puncture site. If you have a continuous block, additional dressings may be added to secure the tubing. The procedure may vary among health care providers and hospitals. What happens after the procedure? After the block is placed, you will be monitored and the effect of the block will be checked. Your shoulder and upper arm should be numb, and your fingers may also feel numb and tingle. If you have a surgical procedure right after you have the block, these things may happen after the surgical procedure is complete: You will be monitored closely in the recovery area. Your arm may remain numb several hours afterward. If you have a continuous block catheter in place, your arm may remain numb for longer. It is important to keep your arm safe from injury while it is numb. You may have to wear a sling to protect your arm. As the nerve block medicine wears off: You may begin to feel pain at the surgical site. If this happens, follow instructions from your health care provider about taking other pain medicine. You  may experience tingling in your arm and hand. You will regain strength in your muscles. Do not drive for 24 hours if you were given a sedative. Summary An interscalene nerve block is an injection of numbing medicine into a group of nerves in your neck that control your shoulder and upper arm. Compared to having general anesthetic alone, the benefits of having this procedure include better control of pain after surgery and a faster recovery with fewer side effects. Your arm may remain numb several hours afterward. If you have a continuous block catheter in place, your arm may remain numb for longer. It is important to keep your arm safe from injury while it is numb. You may have to wear a sling to protect your arm. This information is not intended to replace advice given to you by your health care provider. Make sure you discuss any questions you have with your health care provider. Document Released: 10/12/2015 Document Revised: 06/20/2016 Document Reviewed: 06/20/2016 Elsevier Interactive Patient Education  2019 [place]. Interscalene Nerve Block, Care After This sheet gives you information about how to care for yourself after your procedure. Your health care provider may also give you more specific instructions. If you have problems or questions, contact your health care provider. What can I expect  after the procedure? After the procedure, it is common to have:  Soreness or tenderness in your neck.  Numbness in your shoulder, upper arm, and some fingers.  Weakness in your shoulder and arm muscles. The feeling and strength in your shoulder, arm, and fingers should return to normal within hours after your procedure. Follow these instructions at home: For at least 24 hours after the procedure:  Do not: ? Participate in activities in which you could fall or become injured. ? Drive. ? Use heavy machinery. ? Drink alcohol. ? Take sleeping pills or medicines that cause  drowsiness. ? Make important decisions or sign legal documents. ? Take care of children on your own.  Rest. Eating and drinking  If you vomit, drink water, juice, or soup when you can drink without vomiting.  Make sure you have little or no nausea before eating solid foods.  Follow the diet that is recommended by your health care provider. If you have a sling:  Wear it as told by your health care provider. Remove it only as told by your health care provider.  Loosen the sling if your fingers tingle, become numb, or turn cold and blue.  Make sure that your entire arm, including your wrist, is supported. Do not allow your wrist to dangle over the end of the sling.  Do not let your sling get wet if it is not waterproof.  Keep the sling clean. Bathing  Do not take baths, swim, or use a hot tub until your health care provider approves.  If you have a nerve block catheter in place, keep the incision site and tubing dry. Injection site care   Wash your hands with soap and water before you change your bandage (dressing). If soap and water are not available, use hand sanitizer.  Change your dressing as told by your health care provider.  Keep your dressing dry.  Check your nerve block injection site every day for signs of infection. Check for: ? Redness, swelling, or pain. ? Fluid or blood. ? Warmth. Activity  Do not perform complex or risky activities while taking prescription pain medicine and until you have fully recovered.  Return to your normal activities as told by your health care provider and as you can tolerate them. Ask your health care provider what activities are safe for you.  Rest and take it easy. This will help you heal and recover more quickly and fully.  Be very cautious until you have regained strength and sensation. General instructions  Have a responsible adult stay with you until you are awake and alert.  Do not drive or use heavy machinery while  taking prescription pain medicine and until you have fully recovered. Ask your health care provider when it is safe to drive.  Take over-the-counter and prescription medicines only as told by your health care provider.  If you smoke, do not smoke without supervision.  Do not expose your arm or shoulder to very cold or very hot temperatures until you have full feeling back.  If you have a nerve block catheter in place: ? Try to keep the catheter from getting kinked or pinched. ? Avoid pulling or tugging on the catheter.  Keep all follow-up visits as told by your health care provider. This is important. Contact a health care provider if:  You have chills or fever.  You have redness, swelling, or pain around your injection site.  You have fluid or blood coming from the injection site.  The  skin around the injection site is warm to the touch.  There is a bad smell coming from your dressing.  You have hoarseness or a drooping or dry eye that lasts more than a few days.  You have pain that is poorly controlled with the block or with pain medicine.  You have numbness, tingling, or weakness in your shoulder or arm that lasts for more than one week. Get help right away if:  You have severe pain.  You lose or do not regain strength and sensation in your arm even after the nerve block medicine has stopped.  You have trouble breathing.  You have a nerve block catheter still in place and you begin to shiver.  You have a nerve block catheter still in place and you are getting more and more numb or weak. This information is not intended to replace advice given to you by your health care provider. Make sure you discuss any questions you have with your health care provider. Document Released: 10/12/2015 Document Revised: 06/20/2016 Document Reviewed: 06/20/2016 Elsevier Interactive Patient Education  2019 [place]   1) The drugs that you  were given will stay in your system until tomorrow so for the next 24 hours you should not:  A) Drive an automobile B) Make any legal decisions C) Drink any alcoholic beverage   2) You may resume regular meals tomorrow.  Today it is better to start with liquids and gradually work up to solid foods.  You may eat anything you prefer, but it is better to start with liquids, then soup and crackers, and gradually work up to solid foods.   3) Please notify your doctor immediately if you have any unusual bleeding, trouble breathing, redness and pain at the surgery site, drainage, fever, or pain not relieved by medication.    4) Additional Instructions:        Please contact your physician with any problems or Same Day Surgery at [phone], Monday through Friday 6 am to 4 pm, or [place] [facility] number at [phone].   Orthopedic discharge instructions: Keep dressing dry and intact.  May shower after dressing changed on post-op day #4 (Saturday).  Cover staples with Band-Aids after drying off. Apply ice frequently to shoulder or use Polar Care device. Take oxycodone as prescribed when needed.  May supplement with ES Tylenol if necessary. Keep shoulder immobilizer on at all times except may remove for bathing purposes. Follow-up in 10-14 days or as scheduled.

## 2018-12-15 ENCOUNTER — Encounter: Payer: Self-pay | Admitting: Surgery

## 2018-12-22 NOTE — Anesthesia Postprocedure Evaluation (Signed)
Anesthesia Post Note  Patient: Brandon [name]  Procedure(s) Performed: SHOULDER ARTHROSCOPY WITH EBRIDEMENT, DECOMPRESSION, DISTAL CLAVICLE EXCISION, BICEP TENODESIS AND DEBRIDEMENT, AND REPAIR OF ROTATOR CUFF TEAR (Right )  Patient location during evaluation: PACU Anesthesia Type: General Level of consciousness: awake and alert Pain management: pain level controlled Vital Signs Assessment: post-procedure vital signs reviewed and stable Respiratory status: spontaneous breathing, nonlabored ventilation, respiratory function stable and patient connected to nasal cannula oxygen Cardiovascular status: blood pressure returned to baseline and stable Postop Assessment: no apparent nausea or vomiting Anesthetic complications: no     Last Vitals:  Vitals:   12/14/18 1645 12/14/18 1710  BP: 117/70 116/86  Pulse: 76 81  Resp: 14 16  Temp: 36.5 C 36.6 C  SpO2: 93% 95%    Last Pain:  Vitals:   12/15/18 1013  TempSrc:   PainSc: 0-No pain                 Molli Barrows

## 2019-01-11 ENCOUNTER — Other Ambulatory Visit: Payer: Self-pay | Admitting: Cardiovascular Disease

## 2019-01-17 ENCOUNTER — Other Ambulatory Visit: Payer: Self-pay | Admitting: Family Medicine

## 2019-01-17 MED ORDER — DIAZEPAM 5 MG PO TABS: 5.0000 mg | ORAL_TABLET | Freq: Three times a day (TID) | ORAL | 0 refills | Status: DC | PRN

## 2019-01-17 NOTE — Telephone Encounter (Signed)
Last office visit 05/17/2018 for CPE.  Last refilled 06/22/2018 for #30 with no refills.  No future appointments.

## 2019-03-04 DIAGNOSIS — G5601 Carpal tunnel syndrome, right upper limb: Secondary | ICD-10-CM

## 2019-03-04 HISTORY — DX: Carpal tunnel syndrome, right upper limb: G56.01

## 2019-05-24 NOTE — Progress Notes (Signed)
Cardiology Office Note Date:  05/30/2019  Patient ID:  Brandon [name], Brandon [name] 1966/01/19, MRN [id number] PCP:  Owens Loffler, MD  Cardiologist:  Dr. Rockey Situ, MD    Chief Complaint: Follow up  History of Present Illness: Brandon [name] is a 53 y.o. male with history of nonobstructive CAD by [place] in 2012 with EF > 55%, PAF previously declined [place], PVCs, HTN, ulcerative colitis, obesity, chronic back pain, and arthritis who presents for follow up of his Afib.   Prior cardiac cath in 03/2011 demonstrating nonobstructive CAD with details unclear.  No further ischemic evaluation since.  Patient's Afib dates back to at least 2012. He has been maintained on full dose ASA along with flecainide with his preference to continue this regimen. CHADS2VASc at least 1 (HTN). He was last seen in the office in 05/2018 for routine follow up. He denied any palpitations. It was recommended he stop ASA with recent GI bleeding.   Patient comes in doing well from a cardiac perspective.  He denies any chest pain, shortness of breath, palpitations, dizziness, presyncope, or syncope.  Patient states he is frustrated noting "every time I have to come to the hospital you guys ask me all these questions."  He does not check his blood pressure at home.  He was seen by GI and orthopedics last week with systolic BP in the 163W at each visit.  He does report compliance with Cardizem CD 180 mg daily as well as flecainide 100 mg twice daily.  He denies any recent ulcerative colitis flares.  No BRBPR or melena.  No falls since he was last seen.  He notes some trace bilateral ankle swelling that is typically improved first in the morning and worsens as the day progresses.  He attributes his 23 pound weight gain since he was last seen in 05/2018 to his recent right shoulder surgery and social isolation in the setting of COVID-19 pandemic.  He denies any orthopnea, abdominal distention, PND, or early satiety.  He is uncertain if he  snores though believes he does.  He denies waking up with any apneic episodes.  No prior sleep study.  He does not have any active issues or concerns to discuss today.  Labs: 11/2018 - K+ 3.7, SCr 0.79 04/2018 - LDL 70, TG 135, HGB 15.3, PLT 253, AST/ALT normal, albumin 4.0, TSH normal   Past Medical History:  Diagnosis Date  . ARTHRITIS 06/07/2009  . ATRIAL FIBRILLATION, PAROXYSMAL 06/07/2009  . Cervical disc disorder with radiculopathy of cervical region 10/05/2013  . CHICKENPOX, HX OF 06/07/2009  . 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 Colon polyp   . DEPRESSION 06/07/2009  . DIVERTICULITIS, COLON 06/07/2009  . GERD (gastroesophageal reflux disease)   . Hematuria   . Ulcerative colitis ([place]) 04/28/2018    Past Surgical History:  Procedure Laterality Date  . CARDIAC CATHETERIZATION    . COLONOSCOPY    . COLONOSCOPY WITH PROPOFOL N/A 04/20/2018   Procedure: COLONOSCOPY WITH PROPOFOL;  Surgeon: Lollie Sails, MD;  Location[facility] ENDOSCOPY;  Service: Endoscopy;  Laterality: N/A;  . FOOT SURGERY     left foot  . ROTATOR CUFF REPAIR    . SHOULDER ARTHROSCOPY WITH SUBACROMIAL DECOMPRESSION AND BICEP TENDON REPAIR Right 12/14/2018   Procedure: SHOULDER ARTHROSCOPY WITH EBRIDEMENT, DECOMPRESSION, DISTAL CLAVICLE EXCISION, BICEP TENODESIS AND DEBRIDEMENT, AND REPAIR OF ROTATOR CUFF TEAR;  Surgeon: Corky Mull, MD;  Location: [facility] ORS;  Service: Orthopedics;  Laterality: Right;  . TONSILLECTOMY  Current Meds  Medication Sig  . acetaminophen (TYLENOL) 500 MG tablet Take 1,000 mg by mouth every 6 (six) hours as needed.  . diazepam (VALIUM) 5 MG tablet Take 1 tablet (5 mg total) by mouth every 8 (eight) hours as needed for anxiety.  Marland Kitchen diltiazem (CARDIZEM CD) 180 MG 24 hr capsule TAKE 1 CAPSULE BY MOUTH EVERY DAY  . flecainide (TAMBOCOR) 100 MG tablet TAKE 1 TABLET BY MOUTH TWICE A DAY  . omeprazole (PRILOSEC) 40 MG capsule Take 40 mg by mouth every other day.     Allergies:   Biaxin  [clarithromycin] and Penicillins   Social History:  The patient  reports that he has never smoked. His smokeless tobacco use includes snuff. He reports previous alcohol use. He reports that he does not use drugs.   Family History:  The patient's family history includes Arthritis in an other family member; Diabetes in an other family member; Stroke in his father and another family member.  ROS:   Review of Systems  Constitutional: Negative for chills, diaphoresis, fever, malaise/fatigue and weight loss.  HENT: Negative for congestion.   Eyes: Negative for discharge and redness.  Respiratory: Negative for cough, hemoptysis, sputum production, shortness of breath and wheezing.   Cardiovascular: Negative for chest pain, palpitations, orthopnea, claudication, leg swelling and PND.  Gastrointestinal: Negative for abdominal pain, blood in stool, heartburn, melena, nausea and vomiting.  Genitourinary: Negative for hematuria.  Musculoskeletal: Negative for falls and myalgias.  Skin: Negative for rash.  Neurological: Negative for dizziness, tingling, tremors, sensory change, speech change, focal weakness, loss of consciousness and weakness.  Endo/Heme/Allergies: Does not bruise/bleed easily.  Psychiatric/Behavioral: Negative for substance abuse. The patient is not nervous/anxious.   All other systems reviewed and are negative.    PHYSICAL EXAM:  VS:  BP 130/90 (BP Location: Left Arm, Patient Position: Sitting, Cuff Size: Large)   Pulse 81   Temp (!) 97.3 F (36.3 C)   Ht 6' 1"  (1.854 m)   Wt (!) 342 lb 4 oz (155.2 kg)   SpO2 97%   BMI 45.15 kg/m  BMI: Body mass index is 45.15 kg/m.  Physical Exam  Constitutional: He is oriented to person, place, and time. He appears well-developed and well-nourished.  HENT:  Head: Normocephalic and atraumatic.  Eyes: Right eye exhibits no discharge. Left eye exhibits no discharge.  Neck: Normal range of motion. No JVD present.  Cardiovascular: Normal  rate, regular rhythm, S1 normal, S2 normal and normal heart sounds. Exam reveals no distant heart sounds, no friction rub, no midsystolic click and no opening snap.  No murmur heard. Pulmonary/Chest: Effort normal and breath sounds normal. No respiratory distress. He has no decreased breath sounds. He has no wheezes. He has no rales. He exhibits no tenderness.  Abdominal: Soft. He exhibits no distension. There is no abdominal tenderness.  Musculoskeletal:        General: No edema.  Neurological: He is alert and oriented to person, place, and time.  Skin: Skin is warm and dry. No cyanosis. Nails show no clubbing.  Psychiatric: He has a normal mood and affect. His speech is normal and behavior is normal. Judgment and thought content normal.     EKG:  Was ordered and interpreted by me today. Shows NSR, 81 bpm, normal axis, no acute ST-T changes (unchanged from prior)  Recent Labs: 12/03/2018: BUN 14; Creatinine, Ser 0.79; Potassium 3.7; Sodium 135  No results found for requested labs within last 8760 hours.   CrCl  cannot be calculated (Patient's most recent lab result is older than the maximum 21 days allowed.).   Wt Readings from Last 3 Encounters:  05/30/19 (!) 342 lb 4 oz (155.2 kg)  12/03/18 (!) 325 lb 9 oz (147.7 kg)  05/25/18 (!) 319 lb (144.7 kg)     Other studies reviewed: Additional studies/records reviewed today include: summarized above  ASSESSMENT AND PLAN:  1. Nonobstructive CAD: Prior cardiac cath in 03/2011 demonstrating nonobstructive disease with normal LVEF.  No ischemic evaluation since.  He is doing well without any symptoms concerning for angina.  Aspirin was discontinued at last visit secondary to recent ulcerative colitis flares with associated GI bleeding.  Most recent LDL of 70 from 04/2018 which is followed by PCP.  Continue risk factor modification and primary prevention.    2. PAF: Maintaining sinus rhythm without any symptoms concerning for arrhythmia.  Patient  is insistent stating he does not have A. fib, rather "had A. fib."  Continue Cardizem CD 180 mg daily and flecainide 100 g twice daily.  Prior cardiac cath from 2012 demonstrated nonobstructive disease with normal LV systolic function.  Patient has previously declined long-term, full dose oral anticoagulation and was managed on full dose aspirin up until 05/2018 at which time this was discontinued secondary to lack of stroke prevention and recent GI bleeding in the setting of her ulcerative colitis flares.  Given patient's underlying ulcerative colitis with recent GI bleeding and in the setting of his CHADS2VASc being 1 (hypertension) we will defer addition of full dose anticoagulation at this time.  3. PVCs: Asymptomatic.  Denies any concerning symptoms.  Check serum creatinine, magnesium, and potassium.  Patient prefers to avoid any further lab tests than the above.  Remains on Cardizem CD as above.  4. Hypertension: Blood pressure is mildly elevated at 130/90 today.  Recent BP reading at GI and orthopedic last week in the 711A systolic.  He reports compliance with Cardizem CD.  He has a BP cuff at home though does not check his blood pressure.  Patient would prefer to avoid escalation of antihypertensive therapy at this time.  He will check his BP over the next week and call us to let us know the readings.  I also discussed with him the potential for underlying sleep apnea given the patient's body habitus and recommended sleep study.  He declines this at this time.  Recommend low-salt diet.  Weight loss recommended as below.  5. Obesity: Weight is up 23 pounds when compared to his visit 12 months prior.  Weight loss is recommended.  6. Ulcerative colitis with prior GI bleed: No recent flares.  Followed by GI.  Disposition: F/u with Dr. Rockey Situ or an APP in 12 months, sooner if needed.  Current medicines are reviewed at length with the patient today.  The patient did not have any concerns regarding  medicines.  Signed, Christell Faith, PA-C 05/30/2019 2:57 PM     [place] [address] [place] [address] [place], [place] [address] [phone]

## 2019-05-25 ENCOUNTER — Encounter: Payer: Self-pay | Admitting: Physician Assistant

## 2019-05-27 ENCOUNTER — Telehealth: Payer: Self-pay | Admitting: Cardiovascular Disease

## 2019-05-27 NOTE — Telephone Encounter (Signed)

## 2019-05-30 ENCOUNTER — Encounter: Payer: Self-pay | Admitting: Physician Assistant

## 2019-05-30 ENCOUNTER — Other Ambulatory Visit: Payer: Self-pay

## 2019-05-30 ENCOUNTER — Ambulatory Visit (INDEPENDENT_AMBULATORY_CARE_PROVIDER_SITE_OTHER): Payer: PRIVATE HEALTH INSURANCE | Admitting: Physician Assistant

## 2019-05-30 VITALS — BP 130/90 | HR 81 | Temp 97.3°F | Ht 73.0 in | Wt 342.2 lb

## 2019-05-30 DIAGNOSIS — I10 Essential (primary) hypertension: Secondary | ICD-10-CM | POA: Diagnosis not present

## 2019-05-30 DIAGNOSIS — I480 Paroxysmal atrial fibrillation: Secondary | ICD-10-CM | POA: Diagnosis not present

## 2019-05-30 DIAGNOSIS — I2510 Atherosclerotic heart disease of native coronary artery without angina pectoris: Secondary | ICD-10-CM | POA: Diagnosis not present

## 2019-05-30 DIAGNOSIS — I493 Ventricular premature depolarization: Secondary | ICD-10-CM | POA: Diagnosis not present

## 2019-05-30 DIAGNOSIS — K51919 Ulcerative colitis, unspecified with unspecified complications: Secondary | ICD-10-CM

## 2019-05-30 NOTE — Patient Instructions (Signed)
Medication Instructions:  Your physician recommends that you continue on your current medications as directed. Please refer to the Current Medication list given to you today.  If you need a refill on your cardiac medications before your next appointment, please call your pharmacy.   Lab work: Your physician recommends that you have lab work today(serum Cr, K, Mag)  If you have labs (blood work) drawn today and your tests are completely normal, you will receive your results only by: Marland Kitchen MyChart Message (if you have MyChart) OR . A paper copy in the mail If you have any lab test that is abnormal or we need to change your treatment, we will call you to review the results.  Testing/Procedures: None ordered   Follow-Up: At [facility], you and your health needs are our priority.  As part of our continuing mission to provide you with exceptional heart care, we have created designated Provider Care Teams.  These Care Teams include your primary Cardiologist (physician) and Advanced Practice Providers (APPs -  Physician Assistants and Nurse Practitioners) who all work together to provide you with the care you need, when you need it. You will need a follow up appointment in 12 months.  Please call our office 2 months in advance to schedule this appointment.  You may see Dr. Rockey Situ or Christell Faith, PA-C.   Any Other Special Instructions Will Be Listed Below (If Applicable). Call the clinic in 1 weeks with BP readings.  How to use a home blood pressure monitor. . Be still. Don't smoke, drink caffeinated beverages or exercise within 30 minutes before measuring your blood pressure. . Sit correctly. Sit with your back straight and supported (on a dining chair, rather than a sofa). Your feet should be flat on the floor and your legs should not be crossed. Your arm should be supported on a flat surface (such as a table) with the upper arm at heart level. Make sure the bottom of the cuff is placed directly above  the bend of the elbow.  . Measure at the same time every day. It's important to take the readings at the same time each day, such as morning and evening. Take reading approximately 1 hour after BP medications.

## 2019-05-31 LAB — POTASSIUM: Potassium: 3.8 mmol/L (ref 3.5–5.2)

## 2019-05-31 LAB — CREATININE, SERUM
Creatinine, Ser: 0.96 mg/dL (ref 0.76–1.27)
GFR calc Af Amer: 102 mL/min/{1.73_m2} (ref >59)
GFR calc non Af Amer: 89 mL/min/{1.73_m2} (ref >59)

## 2019-05-31 LAB — MAGNESIUM: Magnesium: 2 mg/dL (ref 1.6–2.3)

## 2019-06-16 ENCOUNTER — Other Ambulatory Visit
Admission: RE | Admit: 2019-06-16 | Discharge: 2019-06-16 | Disposition: A | Discharge status: Home / Self Care | Payer: PRIVATE HEALTH INSURANCE | Source: Ambulatory Visit | Attending: Gastroenterology | Admitting: Gastroenterology

## 2019-06-16 ENCOUNTER — Other Ambulatory Visit: Payer: Self-pay

## 2019-06-16 DIAGNOSIS — K529 Noninfective gastroenteritis and colitis, unspecified: Secondary | ICD-10-CM | POA: Diagnosis not present

## 2019-06-16 DIAGNOSIS — K649 Unspecified hemorrhoids: Secondary | ICD-10-CM | POA: Insufficient documentation

## 2019-06-16 DIAGNOSIS — K633 Ulcer of intestine: Secondary | ICD-10-CM | POA: Insufficient documentation

## 2019-06-16 DIAGNOSIS — K5792 Diverticulitis of intestine, part unspecified, without perforation or abscess without bleeding: Secondary | ICD-10-CM | POA: Insufficient documentation

## 2019-06-16 DIAGNOSIS — Z20828 Contact with and (suspected) exposure to other viral communicable diseases: Secondary | ICD-10-CM | POA: Insufficient documentation

## 2019-06-16 DIAGNOSIS — Z01812 Encounter for preprocedural laboratory examination: Secondary | ICD-10-CM | POA: Insufficient documentation

## 2019-06-16 DIAGNOSIS — K625 Hemorrhage of anus and rectum: Secondary | ICD-10-CM | POA: Diagnosis not present

## 2019-06-17 LAB — SARS CORONAVIRUS 2 (TAT 6-24 HRS): SARS Coronavirus 2: NEGATIVE

## 2019-06-20 ENCOUNTER — Encounter: Payer: Self-pay | Admitting: Anesthesiology

## 2019-06-20 ENCOUNTER — Encounter
Admission: RE | Disposition: A | Discharge status: Home / Self Care | Payer: Self-pay | Source: Home / Self Care | Attending: Gastroenterology

## 2019-06-20 ENCOUNTER — Ambulatory Visit
Admission: RE | Admit: 2019-06-20 | Discharge: 2019-06-20 | Disposition: A | Discharge status: Home / Self Care | Payer: PRIVATE HEALTH INSURANCE | Attending: Gastroenterology | Admitting: Gastroenterology

## 2019-06-20 ENCOUNTER — Other Ambulatory Visit: Payer: Self-pay

## 2019-06-20 ENCOUNTER — Ambulatory Visit: Payer: PRIVATE HEALTH INSURANCE | Admitting: Anesthesiology

## 2019-06-20 DIAGNOSIS — K5150 Left sided colitis without complications: Secondary | ICD-10-CM | POA: Insufficient documentation

## 2019-06-20 DIAGNOSIS — Z79899 Other long term (current) drug therapy: Secondary | ICD-10-CM | POA: Insufficient documentation

## 2019-06-20 DIAGNOSIS — K5730 Diverticulosis of large intestine without perforation or abscess without bleeding: Secondary | ICD-10-CM | POA: Diagnosis not present

## 2019-06-20 DIAGNOSIS — K6289 Other specified diseases of anus and rectum: Secondary | ICD-10-CM | POA: Insufficient documentation

## 2019-06-20 DIAGNOSIS — G43909 Migraine, unspecified, not intractable, without status migrainosus: Secondary | ICD-10-CM | POA: Insufficient documentation

## 2019-06-20 DIAGNOSIS — Z86010 Personal history of colonic polyps: Secondary | ICD-10-CM | POA: Insufficient documentation

## 2019-06-20 DIAGNOSIS — K219 Gastro-esophageal reflux disease without esophagitis: Secondary | ICD-10-CM | POA: Diagnosis not present

## 2019-06-20 DIAGNOSIS — Z880 Allergy status to penicillin: Secondary | ICD-10-CM | POA: Insufficient documentation

## 2019-06-20 DIAGNOSIS — Z881 Allergy status to other antibiotic agents status: Secondary | ICD-10-CM | POA: Diagnosis not present

## 2019-06-20 DIAGNOSIS — I480 Paroxysmal atrial fibrillation: Secondary | ICD-10-CM | POA: Diagnosis not present

## 2019-06-20 DIAGNOSIS — K51311 Ulcerative (chronic) rectosigmoiditis with rectal bleeding: Secondary | ICD-10-CM | POA: Diagnosis present

## 2019-06-20 DIAGNOSIS — G709 Myoneural disorder, unspecified: Secondary | ICD-10-CM | POA: Diagnosis not present

## 2019-06-20 DIAGNOSIS — I10 Essential (primary) hypertension: Secondary | ICD-10-CM | POA: Insufficient documentation

## 2019-06-20 DIAGNOSIS — D122 Benign neoplasm of ascending colon: Secondary | ICD-10-CM | POA: Insufficient documentation

## 2019-06-20 DIAGNOSIS — K635 Polyp of colon: Secondary | ICD-10-CM | POA: Diagnosis not present

## 2019-06-20 DIAGNOSIS — F329 Major depressive disorder, single episode, unspecified: Secondary | ICD-10-CM | POA: Diagnosis not present

## 2019-06-20 HISTORY — PX: COLONOSCOPY WITH PROPOFOL: SHX5780

## 2019-06-20 SURGERY — COLONOSCOPY WITH PROPOFOL
Anesthesia: General

## 2019-06-20 MED ORDER — FENTANYL CITRATE (PF) 100 MCG/2ML IJ SOLN
INTRAMUSCULAR | Status: AC
  Filled 2019-06-20: qty 2

## 2019-06-20 MED ORDER — FENTANYL CITRATE (PF) 100 MCG/2ML IJ SOLN
INTRAMUSCULAR | Status: DC | PRN
  Administered 2019-06-20: 25 ug via INTRAVENOUS
  Administered 2019-06-20: 50 ug via INTRAVENOUS
  Administered 2019-06-20: 25 ug via INTRAVENOUS

## 2019-06-20 MED ORDER — SODIUM CHLORIDE 0.9 % IV SOLN
INTRAVENOUS | Status: DC
  Administered 2019-06-20 (×2): via INTRAVENOUS

## 2019-06-20 MED ORDER — LIDOCAINE HCL (PF) 2 % IJ SOLN
INTRAMUSCULAR | Status: DC | PRN
  Administered 2019-06-20: 100 mg

## 2019-06-20 MED ORDER — LIDOCAINE HCL (PF) 2 % IJ SOLN
INTRAMUSCULAR | Status: AC
  Filled 2019-06-20: qty 10

## 2019-06-20 MED ORDER — PROPOFOL 10 MG/ML IV BOLUS
INTRAVENOUS | Status: DC | PRN
  Administered 2019-06-20: 20 mg via INTRAVENOUS
  Administered 2019-06-20: 50 mg via INTRAVENOUS
  Administered 2019-06-20: 20 mg via INTRAVENOUS

## 2019-06-20 MED ORDER — MIDAZOLAM HCL 2 MG/2ML IJ SOLN
INTRAMUSCULAR | Status: AC
  Filled 2019-06-20: qty 2

## 2019-06-20 MED ORDER — PROPOFOL 500 MG/50ML IV EMUL
INTRAVENOUS | Status: AC
  Filled 2019-06-20: qty 50

## 2019-06-20 MED ORDER — MIDAZOLAM HCL 5 MG/5ML IJ SOLN
INTRAMUSCULAR | Status: DC | PRN
  Administered 2019-06-20: 2 mg via INTRAVENOUS
  Administered 2019-06-20 (×2): 1 mg via INTRAVENOUS

## 2019-06-20 MED ORDER — PROPOFOL 500 MG/50ML IV EMUL
INTRAVENOUS | Status: DC | PRN
  Administered 2019-06-20: 50 ug/kg/min via INTRAVENOUS

## 2019-06-20 NOTE — Transfer of Care (Signed)
Immediate Anesthesia Transfer of Care Note  Patient: Gen Clagg  Procedure(s) Performed: COLONOSCOPY WITH PROPOFOL (N/A )  Patient Location: PACU  Anesthesia Type:General  Level of Consciousness: sedated  Airway & Oxygen Therapy: Patient Spontanous Breathing and Patient connected to nasal cannula oxygen  Post-op Assessment: Report given to RN and Post -op Vital signs reviewed and stable  Post vital signs: Reviewed and stable  Last Vitals:  Vitals Value Taken Time  BP    Temp    Pulse 76 06/20/19 1455  Resp 18 06/20/19 1455  SpO2 99 % 06/20/19 1455  Vitals shown include unvalidated device data.  Last Pain:  Vitals:   06/20/19 1454  TempSrc:   PainSc: 0-No pain         Complications: No apparent anesthesia complications

## 2019-06-20 NOTE — Anesthesia Preprocedure Evaluation (Signed)
Anesthesia Evaluation  Patient identified by MRN, date of birth, ID band Patient awake    Reviewed: Allergy & Precautions, NPO status , Patient's Chart, lab work & pertinent test results, reviewed documented beta blocker date and time   Airway Mallampati: III  TM Distance: >3 FB     Dental  (+) Chipped   Pulmonary           Cardiovascular hypertension, + dysrhythmias Atrial Fibrillation      Neuro/Psych  Headaches, PSYCHIATRIC DISORDERS Depression  Neuromuscular disease    GI/Hepatic PUD,   Endo/Other    Renal/GU      Musculoskeletal   Abdominal   Peds  Hematology   Anesthesia Other Findings Hx of PVCs.  Reproductive/Obstetrics                             Anesthesia Physical Anesthesia Plan  ASA: II  Anesthesia Plan: General   Post-op Pain Management:    Induction: Intravenous  PONV Risk Score and Plan:   Airway Management Planned:   Additional Equipment:   Intra-op Plan:   Post-operative Plan:   Informed Consent: I have reviewed the patients History and Physical, chart, labs and discussed the procedure including the risks, benefits and alternatives for the proposed anesthesia with the patient or authorized representative who has indicated his/her understanding and acceptance.       Plan Discussed with: CRNA  Anesthesia Plan Comments:         Anesthesia Quick Evaluation

## 2019-06-20 NOTE — Anesthesia Post-op Follow-up Note (Signed)
Anesthesia QCDR form completed.        

## 2019-06-20 NOTE — Op Note (Signed)
[facility] Gastroenterology Patient Name: Brandon [name] Procedure Date: 06/20/2019 1:38 PM MRN: [id number] Account #: [id number] Date of Birth: January 20, 1966 Admit Type: Outpatient Age: 53 Room: [facility] ENDO ROOM 3 Gender: Male Note Status: Finalized Procedure:            Colonoscopy Indications:          Left-sided chronic ulcerative colitis, Follow-up of                        left-sided chronic ulcerative colitis, Disease activity                        assessment of left-sided chronic ulcerative colitis Providers:            Lollie Sails, MD Referring MD:         Maud Deed. Copland MD, MD (Referring MD) Medicines:            Monitored Anesthesia Care Complications:        No immediate complications. Procedure:            Pre-Anesthesia Assessment:                       - ASA Grade Assessment: II - A patient with mild                        systemic disease.                       After obtaining informed consent, the colonoscope was                        passed under direct vision. Throughout the procedure,                        the patient's blood pressure, pulse, and oxygen                        saturations were monitored continuously. The                        Colonoscope was introduced through the anus and                        advanced to the the cecum, identified by appendiceal                        orifice and ileocecal valve. The colonoscopy was                        unusually difficult due to poor bowel prep and                        significant looping. Successful completion of the                        procedure was aided by changing the patient to a supine                        position and using manual pressure. The patient  tolerated the procedure well. The quality of the bowel                        preparation was fair. Findings:      A 4 mm polyp was found in the distal sigmoid colon. The polyp was   sessile. The polyp was removed with a cold snare. Resection and       retrieval were complete.      A 7 mm polyp was found in the ascending colon. The polyp was sessile.       The polyp was removed with a cold snare. Resection and retrieval were       complete.      Biopsies for histology were taken with a cold forceps from the cecum,       ascending colon, right transverse colon, left transverse colon,       descending colon, sigmoid colon and rectum for evaluation of microscopic       colitis.      Diffuse mild inflammation characterized by congestion (edema), erythema,       granularity and aphthous ulcerations was found from 30 to 70 cm proximal       to the anus.      The retroflexed view of the distal rectum and anal verge was normal and       showed no anal or rectal abnormalities.      The digital rectal exam was normal.      Multiple medium-mouthed diverticula were found in the sigmoid colon and       descending colon.      I was unable to intubate the terminal ileum due to angulation of the       valve. Impression:           - Preparation of the colon was fair.                       - One 4 mm polyp in the distal sigmoid colon, removed                        with a cold snare. Resected and retrieved.                       - One 7 mm polyp in the ascending colon, removed with a                        cold snare. Resected and retrieved.                       - Diffuse mild inflammation was found from 30 to 70 cm                        proximal to the anus secondary to left-sided colitis.                       - The distal rectum and anal verge are normal on                        retroflexion view.                       - Diverticulosis in the sigmoid colon and in the  descending colon.                       - Biopsies were taken with a cold forceps from the                        cecum, ascending colon, right transverse colon, left                         transverse colon, descending colon, sigmoid colon and                        rectum for evaluation of microscopic colitis. Recommendation:       - Discharge patient to home.                       - Soft diet today, then advance as tolerated to advance                        diet as tolerated.                       - Await pathology results.                       - Return to GI clinic in 3 weeks. Procedure Code(s):    --- Professional ---                       [phone], Colonoscopy, flexible; with removal of tumor(s),                        polyp(s), or other lesion(s) by snare technique                       45380, 65, Colonoscopy, flexible; with biopsy, single                        or multiple Diagnosis Code(s):    --- Professional ---                       K63.5, Polyp of colon                       K51.50, Left sided colitis without complications                       K57.30, Diverticulosis of large intestine without                        perforation or abscess without bleeding CPT copyright 2019 American Medical Association. All rights reserved. The codes documented in this report are preliminary and upon coder review may  be revised to meet current compliance requirements. Lollie Sails, MD 06/20/2019 3:01:47 PM This report has been signed electronically. Number of Addenda: 0 Note Initiated On: 06/20/2019 1:38 PM Scope Withdrawal Time: 0 hours 19 minutes 2 seconds  Total Procedure Duration: 0 hours 55 minutes 27 seconds       [facility]

## 2019-06-20 NOTE — H&P (Signed)
Outpatient short stay form Pre-procedure 06/20/2019 1:37 PM Lollie Sails MD  Primary Physician: Dr. Frederico Hamman Copland  Reason for visit: Colonoscopy  History of present illness: Patient is a 53 year old male presenting today for colonoscopy.  He had carries a diagnosis from a colonoscopy about a year ago of ulcerative colitis.  Was previously taking Azo call which he states he stopped about 8 months ago.  He states that his call made him nauseated and he noticed bleeding with it.  He did have some bleeding prior to starting the is a call as well.  He has not denies being on it now for this period of time.  He does occasionally have some mucousy stools.  States is been a number of months since he seen any blood in his stool.  On his last colonoscopy done 04/20/2018 he was found to have inflammation in a continuous and circumferential pattern from the rectum to the descending colon.  The descending colon and proximally were spared.  Terminal ileum biopsy was normal.  Biopsies from the distal descending sigmoid and rectum showed features of colitis with chronicity moderate to marked active inflammation chronic negative for dysplasia.    Current Facility-Administered Medications:  .  0.9 %  sodium chloride infusion, , Intravenous, Continuous, Lollie Sails, MD  Medications Prior to Admission  Medication Sig Dispense Refill Last Dose  . acetaminophen (TYLENOL) 500 MG tablet Take 1,000 mg by mouth every 6 (six) hours as needed.   Past Week at Unknown time  . diazepam (VALIUM) 5 MG tablet Take 1 tablet (5 mg total) by mouth every 8 (eight) hours as needed for anxiety. 30 tablet 0 Past Week at Unknown time  . diltiazem (CARDIZEM CD) 180 MG 24 hr capsule TAKE 1 CAPSULE BY MOUTH EVERY DAY 90 capsule 1 06/20/2019 at 0730  . flecainide (TAMBOCOR) 100 MG tablet TAKE 1 TABLET BY MOUTH TWICE A DAY 180 tablet 1 06/20/2019 at 0730  . omeprazole (PRILOSEC) 40 MG capsule Take 40 mg by mouth every other day.     Past Week at Unknown time     Allergies  Allergen Reactions  . Biaxin [Clarithromycin] Other (See Comments)    Pt has history of arrhythmias  . Penicillins Rash    Did it involve swelling of the face/tongue/throat, SOB, or low BP? Unknown Did it involve sudden or severe rash/hives, skin peeling, or any reaction on the inside of your mouth or nose? Unknown Did you need to seek medical attention at a hospital or doctor's office? Unknown When did it last happen?childhood allergy If all above answers are "NO", may proceed with cephalosporin use.      Past Medical History:  Diagnosis Date  . ARTHRITIS 06/07/2009  . ATRIAL FIBRILLATION, PAROXYSMAL 06/07/2009  . Cervical disc disorder with radiculopathy of cervical region 10/05/2013  . CHICKENPOX, HX OF 06/07/2009  . 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 Colon polyp   . DEPRESSION 06/07/2009  . DIVERTICULITIS, COLON 06/07/2009  . Dysrhythmia    A-fib  . GERD (gastroesophageal reflux disease)   . Hematuria   . Ulcerative colitis ([place]) 04/28/2018    Review of systems:      Physical Exam    Heart and lungs: Regular rate and rhythm without rub or gallop lungs are bilaterally clear    HEENT: Normocephalic atraumatic eyes are anicteric    Other:    Pertinant exam for procedure: Soft nontender nondistended bowel sounds positive normoactive    Planned proceedures: Colonoscopy and indicated procedures.  I have discussed the risks benefits and complications of procedures to include not limited to bleeding, infection, perforation and the risk of sedation and the patient wishes to proceed.    Lollie Sails, MD Gastroenterology 06/20/2019  1:37 PM

## 2019-06-21 ENCOUNTER — Encounter: Payer: Self-pay | Admitting: Gastroenterology

## 2019-06-21 NOTE — Anesthesia Postprocedure Evaluation (Signed)
Anesthesia Post Note  Patient: Brandon [name]  Procedure(s) Performed: COLONOSCOPY WITH PROPOFOL (N/A )  Patient location during evaluation: Endoscopy Anesthesia Type: General Level of consciousness: awake and alert Pain management: pain level controlled Vital Signs Assessment: post-procedure vital signs reviewed and stable Respiratory status: spontaneous breathing, nonlabored ventilation, respiratory function stable and patient connected to nasal cannula oxygen Cardiovascular status: blood pressure returned to baseline and stable Postop Assessment: no apparent nausea or vomiting Anesthetic complications: no     Last Vitals:  Vitals:   06/20/19 1514 06/20/19 1524  BP: (!) 158/100 (!) 153/89  Pulse: 63 (!) 59  Resp: 12 16  Temp:    SpO2: 100% 100%    Last Pain:  Vitals:   06/20/19 1524  TempSrc:   PainSc: 0-No pain                 Lowen Mansouri S

## 2019-06-22 ENCOUNTER — Telehealth: Payer: Self-pay | Admitting: Cardiovascular Disease

## 2019-06-22 DIAGNOSIS — I2510 Atherosclerotic heart disease of native coronary artery without angina pectoris: Secondary | ICD-10-CM

## 2019-06-22 LAB — SURGICAL PATHOLOGY

## 2019-06-22 MED ORDER — LOSARTAN POTASSIUM 25 MG PO TABS: 25.0000 mg | ORAL_TABLET | Freq: Every day | ORAL | 3 refills | Status: DC

## 2019-06-22 NOTE — Telephone Encounter (Signed)
Returned call to patient with POC update from [place], [place]. Pt agreeable to new medication and blood work but does not want to have sleep study at this time. "I sleep just fine". I attempted to explain reasoning behind sleep study and how it relates to heart. Pt does not want to have sleep study at this time.  Advised pt to call for any further questions or concerns.

## 2019-06-22 NOTE — Telephone Encounter (Signed)
Recommendations: 1) Start losartan 25 mg daily 2) Follow up BMET 1 week after starting losartan 3) Low sodium diet 4) He should reconsider pulmonology referral for sleep study

## 2019-06-22 NOTE — Telephone Encounter (Signed)
Pt c/o BP issue: STAT if pt c/o blurred vision, one-sided weakness or slurred speech  1. What are your last 5 BP readings?  8/19: 148/89 8/18: 156/98 8/17: went for colonoscopy, it was high but does not remember number  2. Are you having any other symptoms (ex. Dizziness, headache, blurred vision, passed out)? Some body aches, but denies headaches or dizziness.  3. What is your BP issue? Patient saw R Dunn on 7/27 and was told to monitor BP. Patient still feels it is running high.  Please call to discuss.

## 2019-06-22 NOTE — Telephone Encounter (Signed)
Call to patient, he was seen by Christell Faith, PA 7/27 and discussed increasing BP medications at that time, however pt preferred to hold off and monitor BP.   Pt did not take BP prior to 8/17 when he had colonoscopy. BP per mychart was high, at d/c was 153/89. 8/18 BP 156/98 8/17 148/89, HR 72 (reports baseline in 70s).  He denies headache, chest pain, SOB, vision changes. Overall he feels well other than BL neck/back pain.   Pt denies changes in diet. "I don't add any salt but don't really watch what I eat". I encouraged him to read labels and avoid high salt content foods including canned foods, sauces, deli meat, frozen dinner".   Pt verbalized understanding. I will call him back with any changes per Christell Faith, PA.

## 2019-06-30 ENCOUNTER — Other Ambulatory Visit
Admission: RE | Admit: 2019-06-30 | Discharge: 2019-06-30 | Disposition: A | Discharge status: Home / Self Care | Payer: PRIVATE HEALTH INSURANCE | Source: Ambulatory Visit | Attending: Physician Assistant | Admitting: Physician Assistant

## 2019-06-30 ENCOUNTER — Other Ambulatory Visit: Payer: Self-pay

## 2019-06-30 DIAGNOSIS — I2510 Atherosclerotic heart disease of native coronary artery without angina pectoris: Secondary | ICD-10-CM | POA: Diagnosis not present

## 2019-06-30 LAB — BASIC METABOLIC PANEL
Anion gap: 10 (ref 5–15)
BUN: 14 mg/dL (ref 6–20)
CO2: 23 mmol/L (ref 22–32)
Calcium: 8.9 mg/dL (ref 8.9–10.3)
Chloride: 106 mmol/L (ref 98–111)
Creatinine, Ser: 0.77 mg/dL (ref 0.61–1.24)
GFR calc Af Amer: >60 mL/min (ref >60)
GFR calc non Af Amer: >60 mL/min (ref >60)
Glucose, Bld: 98 mg/dL (ref 70–99)
Potassium: 3.5 mmol/L (ref 3.5–5.1)
Sodium: 139 mmol/L (ref 135–145)

## 2019-07-16 ENCOUNTER — Other Ambulatory Visit: Payer: Self-pay | Admitting: Cardiovascular Disease

## 2019-07-18 ENCOUNTER — Encounter: Payer: Self-pay | Admitting: Family Medicine

## 2019-07-18 DIAGNOSIS — R5383 Other fatigue: Secondary | ICD-10-CM

## 2019-07-18 DIAGNOSIS — M542 Cervicalgia: Secondary | ICD-10-CM

## 2019-07-26 ENCOUNTER — Other Ambulatory Visit (INDEPENDENT_AMBULATORY_CARE_PROVIDER_SITE_OTHER): Payer: PRIVATE HEALTH INSURANCE

## 2019-07-26 ENCOUNTER — Other Ambulatory Visit: Payer: Self-pay

## 2019-07-26 DIAGNOSIS — R5383 Other fatigue: Secondary | ICD-10-CM

## 2019-07-26 DIAGNOSIS — Z13220 Encounter for screening for lipoid disorders: Secondary | ICD-10-CM | POA: Diagnosis not present

## 2019-07-26 DIAGNOSIS — Z0000 Encounter for general adult medical examination without abnormal findings: Secondary | ICD-10-CM

## 2019-07-26 DIAGNOSIS — M542 Cervicalgia: Secondary | ICD-10-CM | POA: Diagnosis not present

## 2019-07-26 DIAGNOSIS — Z125 Encounter for screening for malignant neoplasm of prostate: Secondary | ICD-10-CM

## 2019-07-26 LAB — HEPATIC FUNCTION PANEL
ALT: 18 U/L (ref 0–53)
AST: 12 U/L (ref 0–37)
Albumin: 3.5 g/dL (ref 3.5–5.2)
Alkaline Phosphatase: 60 U/L (ref 39–117)
Bilirubin, Direct: 0.1 mg/dL (ref 0.0–0.3)
Total Bilirubin: 0.3 mg/dL (ref 0.2–1.2)
Total Protein: 6.2 g/dL (ref 6.0–8.3)

## 2019-07-26 LAB — T4, FREE: Free T4: 0.85 ng/dL (ref 0.60–1.60)

## 2019-07-26 LAB — BASIC METABOLIC PANEL
BUN: 10 mg/dL (ref 6–23)
CO2: 31 mEq/L (ref 19–32)
Calcium: 8.6 mg/dL (ref 8.4–10.5)
Chloride: 103 mEq/L (ref 96–112)
Creatinine, Ser: 0.9 mg/dL (ref 0.40–1.50)
GFR: 87.43 mL/min (ref >60.00)
Glucose, Bld: 99 mg/dL (ref 70–99)
Potassium: 4.1 mEq/L (ref 3.5–5.1)
Sodium: 139 mEq/L (ref 135–145)

## 2019-07-26 LAB — LIPID PANEL
Cholesterol: 131 mg/dL (ref 0–200)
HDL: 25.4 mg/dL — ABNORMAL LOW (ref >39.00)
LDL Cholesterol: 77 mg/dL (ref 0–99)
NonHDL: 105.52
Total CHOL/HDL Ratio: 5
Triglycerides: 142 mg/dL (ref 0.0–149.0)
VLDL: 28.4 mg/dL (ref 0.0–40.0)

## 2019-07-26 LAB — T3, FREE: T3, Free: 3.2 pg/mL (ref 2.3–4.2)

## 2019-07-26 LAB — TSH: TSH: 2.35 u[IU]/mL (ref 0.35–4.50)

## 2019-07-26 NOTE — Addendum Note (Signed)
Addended by: Ellamae Sia on: 07/26/2019 09:10 AM   Modules accepted: Orders

## 2019-07-27 LAB — PSA, TOTAL WITH REFLEX TO PSA, FREE: PSA, Total: 0.9 ng/mL (ref <=4.0)

## 2019-08-01 ENCOUNTER — Ambulatory Visit (INDEPENDENT_AMBULATORY_CARE_PROVIDER_SITE_OTHER): Payer: PRIVATE HEALTH INSURANCE | Admitting: Family Medicine

## 2019-08-01 ENCOUNTER — Encounter: Payer: Self-pay | Admitting: Family Medicine

## 2019-08-01 ENCOUNTER — Other Ambulatory Visit: Payer: Self-pay

## 2019-08-01 VITALS — BP 124/90 | HR 91 | Temp 98.4°F | Ht 71.75 in | Wt 340.5 lb

## 2019-08-01 DIAGNOSIS — Z0000 Encounter for general adult medical examination without abnormal findings: Secondary | ICD-10-CM | POA: Diagnosis not present

## 2019-08-01 DIAGNOSIS — Z23 Encounter for immunization: Secondary | ICD-10-CM

## 2019-08-01 NOTE — Progress Notes (Signed)
Brandon [name] T. Ariel Wingrove, MD Primary Care and [place] at [facility] [place], [address] Phone: [phone]   FAX: [place] - 53 y.o. male   MRN [id number]   Date of Birth: 1965-11-14  Visit Date: 08/01/2019   PCP: Owens Loffler, MD   Referred by: Owens Loffler, MD  Chief Complaint  Patient presents with   Annual Exam   Patient Care Team: Owens Loffler, MD as PCP - General Rockey Situ Kathlene November, MD as Consulting Physician (Cardiology) Subjective:   Brandon [name] is a 53 y.o. pleasant patient who presents with the following:  Preventative Health Maintenance Visit:  Health Maintenance Summary Reviewed and updated, unless pt declines services.  Tobacco History Reviewed. Alcohol: No concerns, no excessive use Exercise Habits: Some activity, rec at least 30 mins 5 times a week STD concerns: no risk or activity to increase risk Drug Use: None Encouraged self-testicular check  Shoulder - surgery.  Neck pain ongoing - seeing a neurosurgeon.   Health Maintenance  Topic Date Due   HIV Screening  01/08/1979   INFLUENZA VACCINE  06/04/2019   TETANUS/TDAP  12/04/2020   COLONOSCOPY  06/19/2022   Hepatitis C Screening  Completed   Immunization History  Administered Date(s) Administered   Influenza Whole 11/15/2009, 07/14/2011   Influenza, Seasonal, Injecte, Preservative Fre 11/08/2012   Influenza,inj,Quad PF,6+ Mos 08/24/2013, 08/16/2014, 08/20/2015, 06/23/2016, 10/08/2017, 08/19/2018   Td 12/04/2010   Patient Active Problem List   Diagnosis Date Noted   Ulcerative colitis with complication ([place]) 20/94/7096   GERD (gastroesophageal reflux disease) 05/13/2016   Ventricular ectopy 12/14/2015   Cervical disc disorder with radiculopathy of cervical region 10/05/2013   Carpal tunnel syndrome 10/05/2013   Essential hypertension 06/16/2013   Classical migraine 03/03/2012    Basal cell carcinoma 06/25/2011   Morbid obesity with BMI of 40.0-44.9, adult ([place]) 06/07/2009   ATRIAL FIBRILLATION, PAROXYSMAL 06/07/2009   DIVERTICULITIS, COLON 06/07/2009   ARTHRITIS 06/07/2009   Past Medical History:  Diagnosis Date   ARTHRITIS 06/07/2009   ATRIAL FIBRILLATION, PAROXYSMAL 06/07/2009   Cervical disc disorder with radiculopathy of cervical region 10/05/2013   CHICKENPOX, HX OF 06/07/2009   Classical migraine 03/03/2012   Colon polyp    DEPRESSION 06/07/2009   DIVERTICULITIS, COLON 06/07/2009   Dysrhythmia    A-fib   GERD (gastroesophageal reflux disease)    Hematuria    Ulcerative colitis ([place]) 04/28/2018   Past Surgical History:  Procedure Laterality Date   CARDIAC CATHETERIZATION     COLONOSCOPY     COLONOSCOPY WITH PROPOFOL N/A 04/20/2018   Procedure: COLONOSCOPY WITH PROPOFOL;  Surgeon: Lollie Sails, MD;  Location: [facility] ENDOSCOPY;  Service: Endoscopy;  Laterality: N/A;   COLONOSCOPY WITH PROPOFOL N/A 06/20/2019   Procedure: COLONOSCOPY WITH PROPOFOL;  Surgeon: Lollie Sails, MD;  Location: [facility] ENDOSCOPY;  Service: Endoscopy;  Laterality: N/A;   FOOT SURGERY     left foot   ROTATOR CUFF REPAIR     SHOULDER ARTHROSCOPY WITH SUBACROMIAL DECOMPRESSION AND BICEP TENDON REPAIR Right 12/14/2018   Procedure: SHOULDER ARTHROSCOPY WITH EBRIDEMENT, DECOMPRESSION, DISTAL CLAVICLE EXCISION, BICEP TENODESIS AND DEBRIDEMENT, AND REPAIR OF ROTATOR CUFF TEAR;  Surgeon: Corky Mull, MD;  Location: [facility] ORS;  Service: Orthopedics;  Laterality: Right;   TONSILLECTOMY     TONSILLECTOMY     Social History   Socioeconomic History   Marital status: Single    Spouse name: Not on file  Number of children: Not on file   Years of education: Not on file   Highest education level: Not on file  Occupational History   Occupation: self employed    Employer: [employer]  Social Needs   Financial resource strain: Not on file   Food insecurity     Worry: Not on file    Inability: Not on file   Transportation needs    Medical: Not on file    Non-medical: Not on file  Tobacco Use   Smoking status: Never Smoker   Smokeless tobacco: Current User    Types: Snuff   Tobacco comment: occasionally  Substance and Sexual Activity   Alcohol use: Not Currently    Alcohol/week: 0.0 standard drinks   Drug use: Never   Sexual activity: Not on file  Lifestyle   Physical activity    Days per week: Not on file    Minutes per session: Not on file   Stress: Not on file  Relationships   Social connections    Talks on phone: Not on file    Gets together: Not on file    Attends religious service: Not on file    Active member of club or organization: Not on file    Attends meetings of clubs or organizations: Not on file    Relationship status: Not on file   Intimate partner violence    Fear of current or ex partner: Not on file    Emotionally abused: Not on file    Physically abused: Not on file    Forced sexual activity: Not on file  Other Topics Concern   Not on file  Social History Narrative   Does not do regular exercise. Former dip - quit about 1 year ago.    Family History  Problem Relation Age of Onset   Stroke Father    Arthritis Other    Diabetes Other        1st degree relative   Stroke Other        F 1st degree relative <60; M 1st degree relative <50   Allergies  Allergen Reactions   Biaxin [Clarithromycin] Other (See Comments)    Pt has history of arrhythmias   Penicillins Rash    Did it involve swelling of the face/tongue/throat, SOB, or low BP? Unknown Did it involve sudden or severe rash/hives, skin peeling, or any reaction on the inside of your mouth or nose? Unknown Did you need to seek medical attention at a hospital or doctor's office? Unknown When did it last happen?childhood allergy If all above answers are "NO", may proceed with cephalosporin use.     Medication list has been  reviewed and updated.   General: Denies fever, chills, sweats. No significant weight loss. Eyes: Denies blurring,significant itching ENT: Denies earache, sore throat, and hoarseness. Cardiovascular: Denies chest pains, palpitations, dyspnea on exertion Respiratory: Denies cough, dyspnea at rest,wheeezing Breast: no concerns about lumps GI: Denies nausea, vomiting, diarrhea, constipation, change in bowel habits, abdominal pain, melena, hematochezia GU: Denies penile discharge, ED, urinary flow / outflow problems. No STD concerns. Musculoskeletal: ONGOING SHOULDER AND NECK PAIN Derm: Denies rash, itching Neuro: Denies  paresthesias, frequent falls, frequent headaches Psych: Denies depression, anxiety Endocrine: Denies cold intolerance, heat intolerance, polydipsia Heme: Denies enlarged lymph nodes Allergy: No hayfever  Objective:   BP 124/90    Pulse 91    Temp 98.4 F (36.9 C) (Temporal)    Ht 5' 11.75" (1.822 m)    Wt Marland Kitchen)  340 lb 8 oz (154.4 kg)    SpO2 93%    BMI 46.50 kg/m  Ideal Body Weight: Weight in (lb) to have BMI = 25: 182.7  Ideal Body Weight: Weight in (lb) to have BMI = 25: 182.7 No exam data present Depression screen [facility] 2/9 08/01/2019 05/17/2018 04/29/2017  Decreased Interest 0 0 0  Down, Depressed, Hopeless 0 0 0  PHQ - 2 Score 0 0 0   GEN: well developed, well nourished, no acute distress Eyes: conjunctiva and lids normal, PERRLA, EOMI ENT: TM clear, nares clear, oral exam WNL Neck: supple, no lymphadenopathy, no thyromegaly, no JVD Pulm: clear to auscultation and percussion, respiratory effort normal CV: regular rate and rhythm, S1-S2, no murmur, rub or gallop, no bruits, peripheral pulses normal and symmetric, no cyanosis, clubbing, edema or varicosities GI: soft, non-tender; no hepatosplenomegaly, masses; active bowel sounds all quadrants GU: no hernia, testicular mass, penile discharge Lymph: no cervical, axillary or inguinal adenopathy MSK: gait normal,  muscle tone and strength WNL, no joint swelling, effusions, discoloration, crepitus  SKIN: clear, good turgor, color WNL, no rashes, lesions, or ulcerations Neuro: normal mental status, normal strength, sensation, and motion Psych: alert; oriented to person, place and time, normally interactive and not anxious or depressed in appearance.  All labs reviewed with patient. Results for orders placed or performed in visit on 07/26/19  TSH  Result Value Ref Range   TSH 2.35 0.35 - 4.50 uIU/mL  T3, free  Result Value Ref Range   T3, Free 3.2 2.3 - 4.2 pg/mL  T4, free  Result Value Ref Range   Free T4 0.85 0.60 - 1.60 ng/dL  Lipid panel  Result Value Ref Range   Cholesterol 131 0 - 200 mg/dL   Triglycerides 142.0 0.0 - 149.0 mg/dL   HDL 25.40 (L) >39.00 mg/dL   VLDL 28.4 0.0 - 40.0 mg/dL   LDL Cholesterol 77 0 - 99 mg/dL   Total CHOL/HDL Ratio 5    NonHDL 105.52   Hepatic function panel  Result Value Ref Range   Total Bilirubin 0.3 0.2 - 1.2 mg/dL   Bilirubin, Direct 0.1 0.0 - 0.3 mg/dL   Alkaline Phosphatase 60 39 - 117 U/L   AST 12 0 - 37 U/L   ALT 18 0 - 53 U/L   Total Protein 6.2 6.0 - 8.3 g/dL   Albumin 3.5 3.5 - 5.2 g/dL  Basic metabolic panel  Result Value Ref Range   Sodium 139 135 - 145 mEq/L   Potassium 4.1 3.5 - 5.1 mEq/L   Chloride 103 96 - 112 mEq/L   CO2 31 19 - 32 mEq/L   Glucose, Bld 99 70 - 99 mg/dL   BUN 10 6 - 23 mg/dL   Creatinine, Ser 0.90 0.40 - 1.50 mg/dL   Calcium 8.6 8.4 - 10.5 mg/dL   GFR 87.43 >60.00 mL/min  PSA, Total with Reflex to PSA, Free  Result Value Ref Range   PSA, Total 0.9 < OR = 4.0 ng/mL    Assessment and Plan:     ICD-10-CM   1. Healthcare maintenance  Z00.00     Health Maintenance Exam: The patient's preventative maintenance and recommended screening tests for an annual wellness exam were reviewed in full today. Brought up to date unless services declined.  Counselled on the importance of diet, exercise, and its role in  overall health and mortality. The patient's FH and SH was reviewed, including their home life, tobacco status, and drug and  alcohol status.  Follow-up in 1 year for physical exam or additional follow-up below.  Follow-up: No follow-ups on file. Or follow-up in 1 year if not noted.  No orders of the defined types were placed in this encounter.  There are no discontinued medications. No orders of the defined types were placed in this encounter.   Signed,  Maud Deed. Chandra Asher, MD   Allergies as of 08/01/2019      Reactions   Biaxin [clarithromycin] Other (See Comments)   Pt has history of arrhythmias   Penicillins Rash   Did it involve swelling of the face/tongue/throat, SOB, or low BP? Unknown Did it involve sudden or severe rash/hives, skin peeling, or any reaction on the inside of your mouth or nose? Unknown Did you need to seek medical attention at a hospital or doctor's office? Unknown When did it last happen?childhood allergy If all above answers are "NO", may proceed with cephalosporin use.      Medication List       Accurate as of August 01, 2019  2:35 PM. If you have any questions, ask your nurse or doctor.        acetaminophen 500 MG tablet Commonly known as: TYLENOL Take 1,000 mg by mouth every 6 (six) hours as needed.   diazepam 5 MG tablet Commonly known as: VALIUM Take 1 tablet (5 mg total) by mouth every 8 (eight) hours as needed for anxiety.   diltiazem 180 MG 24 hr capsule Commonly known as: CARDIZEM CD TAKE 1 CAPSULE BY MOUTH EVERY DAY   flecainide 100 MG tablet Commonly known as: TAMBOCOR TAKE 1 TABLET BY MOUTH TWICE A DAY   gabapentin 300 MG capsule Commonly known as: NEURONTIN Take 300 mg by mouth 3 (three) times daily.   losartan 25 MG tablet Commonly known as: COZAAR Take 1 tablet (25 mg total) by mouth daily.   omeprazole 40 MG capsule Commonly known as: PRILOSEC Take 40 mg by mouth every other day.

## 2019-08-02 ENCOUNTER — Other Ambulatory Visit: Payer: Self-pay | Admitting: Student

## 2019-08-02 DIAGNOSIS — M5412 Radiculopathy, cervical region: Secondary | ICD-10-CM

## 2019-08-15 ENCOUNTER — Other Ambulatory Visit: Payer: Self-pay | Admitting: Student

## 2019-08-15 DIAGNOSIS — Z1389 Encounter for screening for other disorder: Secondary | ICD-10-CM

## 2019-08-16 ENCOUNTER — Other Ambulatory Visit: Payer: Self-pay

## 2019-08-16 ENCOUNTER — Ambulatory Visit
Admission: RE | Admit: 2019-08-16 | Discharge: 2019-08-16 | Disposition: A | Discharge status: Home / Self Care | Payer: PRIVATE HEALTH INSURANCE | Source: Ambulatory Visit | Attending: Student | Admitting: Student

## 2019-08-16 DIAGNOSIS — Z1389 Encounter for screening for other disorder: Secondary | ICD-10-CM | POA: Insufficient documentation

## 2019-08-16 DIAGNOSIS — M5412 Radiculopathy, cervical region: Secondary | ICD-10-CM | POA: Insufficient documentation

## 2019-09-26 ENCOUNTER — Other Ambulatory Visit: Payer: Self-pay

## 2019-09-26 ENCOUNTER — Ambulatory Visit (INDEPENDENT_AMBULATORY_CARE_PROVIDER_SITE_OTHER): Payer: PRIVATE HEALTH INSURANCE | Admitting: Family Medicine

## 2019-09-26 ENCOUNTER — Ambulatory Visit (INDEPENDENT_AMBULATORY_CARE_PROVIDER_SITE_OTHER)
Admission: RE | Admit: 2019-09-26 | Discharge: 2019-09-26 | Disposition: A | Discharge status: Home / Self Care | Payer: PRIVATE HEALTH INSURANCE | Source: Ambulatory Visit | Attending: Family Medicine | Admitting: Family Medicine

## 2019-09-26 ENCOUNTER — Encounter: Payer: Self-pay | Admitting: Family Medicine

## 2019-09-26 VITALS — BP 130/82 | HR 72 | Temp 98.1°F | Ht 71.75 in | Wt 347.8 lb

## 2019-09-26 DIAGNOSIS — M79604 Pain in right leg: Secondary | ICD-10-CM

## 2019-09-26 NOTE — Progress Notes (Signed)
Nekesha Font T. Yulisa Chirico, MD Primary Care and [place] at [facility] [place], [address] Phone: [phone]  FAX: [phone]  Brandon [name] - 53 y.o. male  MRN [id number]  Date of Birth: [birth year]  Visit Date: 09/26/2019  PCP: Owens Loffler, MD  Referred by: Owens Loffler, MD  Chief Complaint  Patient presents with  . Leg Pain    Right   Subjective:   Brandon [name] is a 53 y.o. very pleasant male patient who presents with the following:  [place] through a trailer.  This was last week and he has some relatively diffuse swelling and abrasions in his right leg, this also extends above the knee.  He is continued to having some pain, so he wanted to come in and have this checked out.  Past Medical History, Surgical History, Social History, Family History, Problem List, Medications, and Allergies have been reviewed and updated if relevant.  Patient Active Problem List   Diagnosis Date Noted  . Ulcerative colitis with complication ([place]) 123XX123  . GERD (gastroesophageal reflux disease) 05/13/2016  . Ventricular ectopy 12/14/2015  . Cervical disc disorder with radiculopathy of cervical region 10/05/2013  . Carpal tunnel syndrome 10/05/2013  . Essential hypertension 06/16/2013  . 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 Basal cell carcinoma 06/25/2011  . Morbid obesity with BMI of 40.0-44.9, adult ([place]) 06/07/2009  . ATRIAL FIBRILLATION, PAROXYSMAL 06/07/2009  . DIVERTICULITIS, COLON 06/07/2009  . ARTHRITIS 06/07/2009    Past Medical History:  Diagnosis Date  . ARTHRITIS 06/07/2009  . ATRIAL FIBRILLATION, PAROXYSMAL 06/07/2009  . Cervical disc disorder with radiculopathy of cervical region 10/05/2013  . CHICKENPOX, HX OF 06/07/2009  . 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 Colon polyp   . DEPRESSION 06/07/2009  . DIVERTICULITIS, COLON 06/07/2009  . Dysrhythmia    A-fib  . GERD (gastroesophageal reflux disease)   .  Hematuria   . Ulcerative colitis ([place]) 04/28/2018    Past Surgical History:  Procedure Laterality Date  . CARDIAC CATHETERIZATION    . COLONOSCOPY    . COLONOSCOPY WITH PROPOFOL N/A 04/20/2018   Procedure: COLONOSCOPY WITH PROPOFOL;  Surgeon: Lollie Sails, MD;  [facility] ENDOSCOPY;  Service: Endoscopy;  Laterality: N/A;  . COLONOSCOPY WITH PROPOFOL N/A 06/20/2019   Procedure: COLONOSCOPY WITH PROPOFOL;  Surgeon: Lollie Sails, MD;  Location: [facility] ENDOSCOPY;  Service: Endoscopy;  Laterality: N/A;  . FOOT SURGERY     left foot  . ROTATOR CUFF REPAIR    . SHOULDER ARTHROSCOPY WITH SUBACROMIAL DECOMPRESSION AND BICEP TENDON REPAIR Right 12/14/2018   Procedure: SHOULDER ARTHROSCOPY WITH EBRIDEMENT, DECOMPRESSION, DISTAL CLAVICLE EXCISION, BICEP TENODESIS AND DEBRIDEMENT, AND REPAIR OF ROTATOR CUFF TEAR;  Surgeon: Corky Mull, MD;  Location: [facility] ORS;  Service: Orthopedics;  Laterality: Right;  . TONSILLECTOMY    . TONSILLECTOMY      Social History   Socioeconomic History  . Marital status: Single    Spouse name: Not on file  . Number of children: Not on file  . Years of education: Not on file  . Highest education level: Not on file  Occupational History  . Occupation: self employed    Fish farm manager: [employer]  Social Needs  . Financial resource strain: Not on file  . Food insecurity    Worry: Not on file    Inability: Not on file  . Transportation needs    Medical: Not on file    Non-medical: Not on file  Tobacco Use  . Smoking status: Never Smoker  . Smokeless tobacco: Current User    Types: Snuff  . Tobacco comment: occasionally  Substance and Sexual Activity  . Alcohol use: Not Currently    Alcohol/week: 0.0 standard drinks  . Drug use: Never  . Sexual activity: Not on file  Lifestyle  . Physical activity    Days per week: Not on file    Minutes per session: Not on file  . Stress: Not on file  Relationships  . Social Herbalist on phone: Not  on file    Gets together: Not on file    Attends religious service: Not on file    Active member of club or organization: Not on file    Attends meetings of clubs or organizations: Not on file    Relationship status: Not on file  . Intimate partner violence    Fear of current or ex partner: Not on file    Emotionally abused: Not on file    Physically abused: Not on file    Forced sexual activity: Not on file  Other Topics Concern  . Not on file  Social History Narrative   Does not do regular exercise. Former dip - quit about 1 year ago.     Family History  Problem Relation Age of Onset  . Stroke Father   . Arthritis Other   . Diabetes Other        1st degree relative  . Stroke Other        F 1st degree relative <60; M 1st degree relative <50    Allergies  Allergen Reactions  . Biaxin [Clarithromycin] Other (See Comments)    Pt has history of arrhythmias  . Penicillins Rash    Did it involve swelling of the face/tongue/throat, SOB, or low BP? Unknown Did it involve sudden or severe rash/hives, skin peeling, or any reaction on the inside of your mouth or nose? Unknown Did you need to seek medical attention at a hospital or doctor's office? Unknown When did it last happen?childhood allergy If all above answers are "NO", may proceed with cephalosporin use.     Medication list reviewed and updated in full in [place].   GEN: No acute illnesses, no fevers, chills. GI: No n/v/d, eating normally Pulm: No SOB Interactive and getting along well at home.  Otherwise, ROS is as per the HPI.  Objective:   BP 130/82   Pulse 72   Temp 98.1 F (36.7 C) (Temporal)   Ht 5' 11.75" (1.822 m)   Wt (!) 347 lb 12 oz (157.7 kg)   SpO2 94%   BMI 47.49 kg/m   GEN: WDWN, NAD, Non-toxic, A & O x 3 HEENT: Atraumatic, Normocephalic. Neck supple. No masses, No LAD. Ears and Nose: No external deformity.  Right knee: There is no significant effusion and flexion is to 110  degrees.  He does have stable ACL, PCL, MCL and LCL.  In the proximal tibia there is some tenderness to palpation.  No pain at the patella.  Quad and patellar tendons are intact.  EXTR: No c/c/e NEURO Normal gait.  PSYCH: Normally interactive. Conversant. Not depressed or anxious appearing.  Calm demeanor.   Laboratory and Imaging Data: Dg Tibia/fibula Right  Result Date: 09/26/2019 CLINICAL DATA:  Fall, leg pain EXAM: RIGHT TIBIA AND FIBULA - 2 VIEW COMPARISON:  None. FINDINGS: No fracture or dislocation of the right tibia or fibula. Soft tissue edema about  the leg. The partially imaged knee and ankle are unremarkable. IMPRESSION: No fracture or dislocation of the right tibia or fibula. Soft tissue edema about the leg. Electronically Signed   By: Eddie Candle M.D.   On: 09/26/2019 16:27     Assessment and Plan:     ICD-10-CM   1. Right leg pain  M79.604 DG Tibia/Fibula Right   This is a soft tissue injury only.  Recommended elevation, some compression, and this will resolve over time alone.  Follow-up: No follow-ups on file.  No orders of the defined types were placed in this encounter.  Orders Placed This Encounter  Procedures  . DG Tibia/Fibula Right    Signed,  Jemell Town T. Sujey Gundry, MD   Outpatient Encounter Medications as of 09/26/2019  Medication Sig  . acetaminophen (TYLENOL) 500 MG tablet Take 1,000 mg by mouth every 6 (six) hours as needed.  . diazepam (VALIUM) 5 MG tablet Take 1 tablet (5 mg total) by mouth every 8 (eight) hours as needed for anxiety.  Marland Kitchen diltiazem (CARDIZEM CD) 180 MG 24 hr capsule TAKE 1 CAPSULE BY MOUTH EVERY DAY  . flecainide (TAMBOCOR) 100 MG tablet TAKE 1 TABLET BY MOUTH TWICE A DAY  . gabapentin (NEURONTIN) 300 MG capsule Take 300 mg by mouth 3 (three) times daily.   Marland Kitchen omeprazole (PRILOSEC) 40 MG capsule Take 40 mg by mouth every other day.   . losartan (COZAAR) 25 MG tablet Take 1 tablet (25 mg total) by mouth daily.   No  facility-administered encounter medications on file as of 09/26/2019.

## 2020-01-25 ENCOUNTER — Encounter: Payer: Self-pay | Admitting: Family Medicine

## 2020-01-26 ENCOUNTER — Encounter: Payer: Self-pay | Admitting: Family Medicine

## 2020-01-26 ENCOUNTER — Other Ambulatory Visit: Payer: Self-pay

## 2020-01-26 ENCOUNTER — Other Ambulatory Visit: Payer: Self-pay | Admitting: Family Medicine

## 2020-01-26 ENCOUNTER — Ambulatory Visit (INDEPENDENT_AMBULATORY_CARE_PROVIDER_SITE_OTHER): Payer: 59 | Admitting: Family Medicine

## 2020-01-26 VITALS — BP 132/90 | HR 69 | Temp 98.1°F | Ht 71.75 in | Wt 340.8 lb

## 2020-01-26 DIAGNOSIS — L748 Other eccrine sweat disorders: Secondary | ICD-10-CM | POA: Diagnosis not present

## 2020-01-26 MED ORDER — DIAZEPAM 5 MG PO TABS: 5.0000 mg | ORAL_TABLET | Freq: Three times a day (TID) | ORAL | 0 refills | Status: DC | PRN

## 2020-01-26 NOTE — Progress Notes (Signed)
     Brandon [name] T. Adelena Desantiago, MD Primary [facility] and [facility] at [facility] [place], [address] Phone: [phone]  FAX: [phone]  Brandon [name] - 54 y.o. male  MRN [id number]  Date of Birth: 04-30-1966  Visit Date: 01/26/2020  PCP: Owens Loffler, MD  Referred by: Owens Loffler, MD  Chief Complaint  Patient presents with  . Knot in Testicle    This visit occurred during the SARS-CoV-2 public health emergency.  Safety protocols were in place, including screening questions prior to the visit, additional usage of staff PPE, and extensive cleaning of exam room while observing appropriate contact time as indicated for disinfecting solutions.   Subjective:   Brandon [name] is a 54 y.o. very pleasant male patient who presents with the following:  Knot on testicle: Hard place on the sac and a hard knote.  Today it really small. It did hurt the other day.  If you hit it, and yest was noticeable  Review of Systems is noted in the HPI, as appropriate  Objective:   BP 132/90   Pulse 69   Temp 98.1 F (36.7 C) (Temporal)   Ht 5' 11.75" (1.822 m)   Wt (!) 340 lb 12 oz (154.6 kg)   SpO2 97%   BMI 46.54 kg/m   GEN: No acute distress; alert,appropriate. PULM: Breathing comfortably in no respiratory distress PSYCH: Normally interactive.   There is a small knot approximately the size of a pea on the scrotum itself and there is no palpable enlargement or masses on the testicle itself.  Laboratory and Imaging Data:  Assessment and Plan:     ICD-10-CM   1. 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  Follow-up: No follow-ups on file.  No orders of the defined types were placed in this encounter.  There are no discontinued medications. No orders of the defined types were placed in this encounter.   Signed,  Brandon Deed. Zayley Arras, MD   Outpatient  Encounter Medications as of 01/26/2020  Medication Sig  . acetaminophen (TYLENOL) 500 MG tablet Take 1,000 mg by mouth every 6 (six) hours as needed.  . diazepam (VALIUM) 5 MG tablet Take 1 tablet (5 mg total) by mouth every 8 (eight) hours as needed for anxiety.  Marland Kitchen diltiazem (CARDIZEM CD) 180 MG 24 hr capsule TAKE 1 CAPSULE BY MOUTH EVERY DAY  . flecainide (TAMBOCOR) 100 MG tablet TAKE 1 TABLET BY MOUTH TWICE A DAY  . gabapentin (NEURONTIN) 300 MG capsule Take 300 mg by mouth 3 (three) times daily.   Marland Kitchen losartan (COZAAR) 25 MG tablet Take 1 tablet (25 mg total) by mouth daily.  Marland Kitchen omeprazole (PRILOSEC) 40 MG capsule Take 40 mg by mouth every other day.    No facility-administered encounter medications on file as of 01/26/2020.

## 2020-01-26 NOTE — Telephone Encounter (Signed)
Last office visit 01/26/2020 for sweat gland cyst.  Last refilled 01/17/2019 for #30 with no refills.  No future appointments.

## 2020-02-13 ENCOUNTER — Encounter: Payer: Self-pay | Admitting: Family Medicine

## 2020-02-13 DIAGNOSIS — L748 Other eccrine sweat disorders: Secondary | ICD-10-CM

## 2020-02-13 DIAGNOSIS — N5089 Other specified disorders of the male genital organs: Secondary | ICD-10-CM

## 2020-02-25 ENCOUNTER — Other Ambulatory Visit
Admission: RE | Admit: 2020-02-25 | Discharge: 2020-02-25 | Disposition: A | Discharge status: Home / Self Care | Payer: 59 | Source: Ambulatory Visit | Attending: Student | Admitting: Student

## 2020-02-25 DIAGNOSIS — K51919 Ulcerative colitis, unspecified with unspecified complications: Secondary | ICD-10-CM | POA: Insufficient documentation

## 2020-02-25 LAB — GASTROINTESTINAL PANEL BY PCR, STOOL (REPLACES STOOL CULTURE)

## 2020-02-25 LAB — C DIFFICILE QUICK SCREEN W PCR REFLEX
C Diff antigen: NEGATIVE
C Diff interpretation: NOT DETECTED
C Diff toxin: NEGATIVE

## 2020-02-28 LAB — CALPROTECTIN, FECAL: Calprotectin, Fecal: 900 ug/g — ABNORMAL HIGH (ref 0–120)

## 2020-05-29 NOTE — Progress Notes (Signed)
Cardiology Office Note  Date:  05/30/2020   ID:  Brandon [name], DOB 1966-06-04, MRN [id number]  PCP:  Owens Loffler, MD   Chief Complaint  Patient presents with  . office visit    12 month F/U; Meds verbally reviewed with patient.    HPI:  Brandon [name] is a 54 -year-old gentleman with history of  hypertension,  obesity,  chronic back pain,  atrial fibrillation , previously declined anticoagulation Atrial fibrillation likely dating back to before 2012, appreciated fluttering, does not remember circumstances Remote history of PVCs Cardiac catheterization may 2012 showed no significant CAD, normal ejection fraction greater than 55%. who presents for routine followup of his atrial fibrillation, hypertension, morbid obesity.  Last seen in clinic by myself July 2019 At that time was having problems with diarrhea, ulcerative colitis, followed by GI  Seen by one of our providers September 2020 Weight up 342 pounds At that time had no new cardiac issues Weight had trended up over 20 pounds in 1 year  Not on anticoagulation secondary to [place] VASC 1 Preferred to stay on aspirin Tolerating diltiazem and flecainide  Denies any palpitations concerning for arrhythmia No regular exercise program Main issues on today's visit is his cervical and lower back pain  Back pain may stem from prior fall off a combine onto metal disc striking mid and lower lumbar spine in October 2017 Chronic pain since that time  He is a non-smoker, no diabetes, cholesterol at goal Blood pressure stable  EKG personally reviewed by myself on todays visit shows normal sinus rhythm rate 71 bpm no significant ST or T-wave changes  Other past medical history reviewed Works on a farm with heavy machinery   several years since his last episode of atrial fibrillation.   Cardiac catheterization may 2012 showed no significant CAD, normal ejection fraction greater than 55%. Cholesterol well controlled  without any cluster medication  PMH:   has a past medical history of ARTHRITIS (06/07/2009), ATRIAL FIBRILLATION, PAROXYSMAL (06/07/2009), Cervical disc disorder with radiculopathy of cervical region (10/05/2013), CHICKENPOX, HX OF (06/07/2009), Classical migraine (03/03/2012), Colon polyp, DEPRESSION (06/07/2009), DIVERTICULITIS, COLON (06/07/2009), Dysrhythmia, GERD (gastroesophageal reflux disease), Hematuria, and Ulcerative colitis ([place]) (04/28/2018).  PSH:    Past Surgical History:  Procedure Laterality Date  . CARDIAC CATHETERIZATION    . COLONOSCOPY    . COLONOSCOPY WITH PROPOFOL N/A 04/20/2018   Procedure: COLONOSCOPY WITH PROPOFOL;  Surgeon: Lollie Sails, MD;  Location: [facility] END[facility]COPY;  Service: Endoscopy;  Laterality: N/A;  . COLONOSCOPY WITH PROPOFOL N/A 06/20/2019   Procedure: COLONOSCOPY WITH PROPOFOL;  Surgeon: Lollie Sails, MD;  Location[facility] ENDOSCOPY;  Service: Endoscopy;  Laterality: N/A;  . FOOT SURGERY     left foot  . ROTATOR CUFF REPAIR    . SHOULDER ARTHROSCOPY WITH SUBACROMIAL DECOMPRESSION AND BICEP TENDON REPAIR Right 12/14/2018   Procedure: SHOULDER ARTHROSCOPY WITH EBRIDEMENT, DECOMPRESSION, DISTAL CLAVICLE EXCISION, BICEP TENODESIS AND DEBRIDEMENT, AND REPAIR OF ROTATOR CUFF TEAR;  Surgeon: Corky Mull, MD;  Location: [facility] ORS;  Service: Orthopedics;  Laterality: Right;  . TONSILLECTOMY    . TONSILLECTOMY      Current Outpatient Medications  Medication Sig Dispense Refill  . acetaminophen (TYLENOL) 500 MG tablet Take 1,000 mg by mouth every 6 (six) hours as needed.    . diazepam (VALIUM) 5 MG tablet Take 1 tablet (5 mg total) by mouth every 8 (eight) hours as needed for anxiety. 30 tablet 0  . diltiazem (CARDIZEM CD) 180 MG 24 hr  capsule TAKE 1 CAPSULE BY MOUTH EVERY DAY 90 capsule 3  . flecainide (TAMBOCOR) 100 MG tablet TAKE 1 TABLET BY MOUTH TWICE A DAY 180 tablet 3  . gabapentin (NEURONTIN) 300 MG capsule Take 300 mg by mouth 3 (three) times daily.     Marland Kitchen  losartan (COZAAR) 25 MG tablet TAKE 1 TABLET BY MOUTH EVERY DAY 90 tablet 3  . omeprazole (PRILOSEC) 40 MG capsule Take 40 mg by mouth every other day.      No current facility-administered medications for this visit.    Allergies:   Biaxin [clarithromycin] and Penicillins   Social History:  The patient  reports that he has never smoked. His smokeless tobacco use includes snuff. He reports previous alcohol use. He reports that he does not use drugs.   Family History:   family history includes Arthritis in an other family member; Diabetes in an other family member; Stroke in his father and another family member.    Review of Systems: Review of Systems  Constitutional: Negative.   Respiratory: Negative.   Cardiovascular: Negative.   Gastrointestinal: Negative.  Negative for nausea.  Musculoskeletal: Positive for back pain.       Neck pain, low back pain  Neurological: Negative.   Psychiatric/Behavioral: Negative.   All other systems reviewed and are negative.   PHYSICAL EXAM: VS:  BP (!) 140/86 (BP Location: Left Arm, Patient Position: Sitting, Cuff Size: Large)   Pulse 71   Ht 6' 2"  (1.88 m)   Wt (!) 345 lb 4 oz (156.6 kg)   SpO2 95%   BMI 44.33 kg/m  , BMI Body mass index is 44.33 kg/m.  Constitutional:  oriented to person, place, and time. No distress.  HENT:  Head: Grossly normal Eyes:  no discharge. No scleral icterus.  Neck: No JVD, no carotid bruits  Cardiovascular: Regular rate and rhythm, no murmurs appreciated Pulmonary/Chest: Clear to auscultation bilaterally, no wheezes or rails Abdominal: Soft.  no distension.  no tenderness.  Musculoskeletal: Normal range of motion Neurological:  normal muscle tone. Coordination normal. No atrophy Skin: Skin warm and dry Psychiatric: normal affect, pleasant  Recent Labs: 07/26/2019: ALT 18; BUN 10; Creatinine, Ser 0.90; Potassium 4.1; Sodium 139; TSH 2.35    Lipid Panel Lab Results  Component Value Date   CHOL 131  07/26/2019   HDL 25.40 (L) 07/26/2019   LDLCALC 77 07/26/2019   TRIG 142.0 07/26/2019      Wt Readings from Last 3 Encounters:  05/30/20 (!) 345 lb 4 oz (156.6 kg)  01/26/20 (!) 340 lb 12 oz (154.6 kg)  09/26/19 (!) 347 lb 12 oz (157.7 kg)     ASSESSMENT AND PLAN:  Atrial fibrillation, Paroxysmal - Plan: EKG 12-Lead CHADSVASC with him, score of 1 Denies recent symptoms concerning for arrhythmia Continue diltiazem and flecainide.  Discussed risks and benefit of these medications, he reports feeling comfortable staying on the medications at this time Refills provided  Ulcerative colitis Previously reported bleeding diarrhea Improving on medication Stable at this time  Essential hypertension - Plan: EKG 12-Lead Previously noted to have elevated diastolic pressure, improved on today's visit even with walking to the office No medication changes made  Morbid obesity with BMI of 40.0-44.9, adult ([place]) Works on the farm, tries to stay active despite back pain cervical and lower back Recommended dietary discretion, lifestyle modification  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 EKG 12-Lead     Signed, Esmond Plants, M.D., Ph.D. 05/30/2020  [place], [place]

## 2020-05-30 ENCOUNTER — Other Ambulatory Visit: Payer: Self-pay | Admitting: Physician Assistant

## 2020-05-30 ENCOUNTER — Other Ambulatory Visit: Payer: Self-pay

## 2020-05-30 ENCOUNTER — Ambulatory Visit: Payer: 59 | Admitting: Cardiovascular Disease

## 2020-05-30 ENCOUNTER — Encounter: Payer: Self-pay | Admitting: Cardiovascular Disease

## 2020-05-30 VITALS — BP 140/86 | HR 71 | Ht 74.0 in | Wt 345.2 lb

## 2020-05-30 DIAGNOSIS — I480 Paroxysmal atrial fibrillation: Secondary | ICD-10-CM | POA: Diagnosis not present

## 2020-05-30 DIAGNOSIS — I493 Ventricular premature depolarization: Secondary | ICD-10-CM

## 2020-05-30 DIAGNOSIS — I10 Essential (primary) hypertension: Secondary | ICD-10-CM

## 2020-05-30 DIAGNOSIS — I2510 Atherosclerotic heart disease of native coronary artery without angina pectoris: Secondary | ICD-10-CM

## 2020-05-30 NOTE — Patient Instructions (Signed)

## 2020-06-29 ENCOUNTER — Other Ambulatory Visit: Payer: Self-pay | Admitting: Surgery

## 2020-06-29 DIAGNOSIS — M7582 Other shoulder lesions, left shoulder: Secondary | ICD-10-CM

## 2020-07-04 DIAGNOSIS — M19019 Primary osteoarthritis, unspecified shoulder: Secondary | ICD-10-CM

## 2020-07-04 HISTORY — DX: Primary osteoarthritis, unspecified shoulder: M19.019

## 2020-07-06 ENCOUNTER — Other Ambulatory Visit: Payer: Self-pay | Admitting: Cardiovascular Disease

## 2020-07-19 ENCOUNTER — Ambulatory Visit
Admission: RE | Admit: 2020-07-19 | Discharge: 2020-07-19 | Disposition: A | Discharge status: Home / Self Care | Payer: 59 | Source: Ambulatory Visit | Attending: Surgery | Admitting: Surgery

## 2020-07-19 ENCOUNTER — Other Ambulatory Visit: Payer: Self-pay

## 2020-07-19 DIAGNOSIS — M7582 Other shoulder lesions, left shoulder: Secondary | ICD-10-CM | POA: Insufficient documentation

## 2020-07-25 ENCOUNTER — Other Ambulatory Visit: Payer: Self-pay | Admitting: Family Medicine

## 2020-07-25 DIAGNOSIS — Z13220 Encounter for screening for lipoid disorders: Secondary | ICD-10-CM

## 2020-07-25 DIAGNOSIS — E039 Hypothyroidism, unspecified: Secondary | ICD-10-CM

## 2020-07-25 DIAGNOSIS — Z125 Encounter for screening for malignant neoplasm of prostate: Secondary | ICD-10-CM

## 2020-07-25 DIAGNOSIS — Z79899 Other long term (current) drug therapy: Secondary | ICD-10-CM

## 2020-07-25 DIAGNOSIS — Z131 Encounter for screening for diabetes mellitus: Secondary | ICD-10-CM

## 2020-08-02 ENCOUNTER — Other Ambulatory Visit: Payer: Self-pay

## 2020-08-02 ENCOUNTER — Other Ambulatory Visit (INDEPENDENT_AMBULATORY_CARE_PROVIDER_SITE_OTHER): Payer: 59

## 2020-08-02 DIAGNOSIS — E039 Hypothyroidism, unspecified: Secondary | ICD-10-CM | POA: Diagnosis not present

## 2020-08-02 DIAGNOSIS — Z13220 Encounter for screening for lipoid disorders: Secondary | ICD-10-CM

## 2020-08-02 DIAGNOSIS — Z131 Encounter for screening for diabetes mellitus: Secondary | ICD-10-CM

## 2020-08-02 DIAGNOSIS — Z125 Encounter for screening for malignant neoplasm of prostate: Secondary | ICD-10-CM

## 2020-08-02 DIAGNOSIS — Z79899 Other long term (current) drug therapy: Secondary | ICD-10-CM | POA: Diagnosis not present

## 2020-08-02 LAB — CBC WITH DIFFERENTIAL/PLATELET
Basophils Absolute: 0.1 10*3/uL (ref 0.0–0.1)
Basophils Relative: 0.8 % (ref 0.0–3.0)
Eosinophils Absolute: 0.2 10*3/uL (ref 0.0–0.7)
Eosinophils Relative: 2.1 % (ref 0.0–5.0)
HCT: 44.3 % (ref 39.0–52.0)
Hemoglobin: 14.4 g/dL (ref 13.0–17.0)
Lymphocytes Relative: 22.5 % (ref 12.0–46.0)
Lymphs Abs: 1.9 10*3/uL (ref 0.7–4.0)
MCHC: 32.6 g/dL (ref 30.0–36.0)
MCV: 85.5 fl (ref 78.0–100.0)
Monocytes Absolute: 0.6 10*3/uL (ref 0.1–1.0)
Monocytes Relative: 6.5 % (ref 3.0–12.0)
Neutro Abs: 5.9 10*3/uL (ref 1.4–7.7)
Neutrophils Relative %: 68.1 % (ref 43.0–77.0)
Platelets: 260 10*3/uL (ref 150.0–400.0)
RBC: 5.18 Mil/uL (ref 4.22–5.81)
RDW: 15.2 % (ref 11.5–15.5)
WBC: 8.7 10*3/uL (ref 4.0–10.5)

## 2020-08-02 LAB — T3, FREE: T3, Free: 3.2 pg/mL (ref 2.3–4.2)

## 2020-08-02 LAB — BASIC METABOLIC PANEL
BUN: 12 mg/dL (ref 6–23)
CO2: 30 mEq/L (ref 19–32)
Calcium: 8.9 mg/dL (ref 8.4–10.5)
Chloride: 104 mEq/L (ref 96–112)
Creatinine, Ser: 0.94 mg/dL (ref 0.40–1.50)
GFR: 82.85 mL/min (ref >60.00)
Glucose, Bld: 95 mg/dL (ref 70–99)
Potassium: 4.3 mEq/L (ref 3.5–5.1)
Sodium: 139 mEq/L (ref 135–145)

## 2020-08-02 LAB — LIPID PANEL
Cholesterol: 131 mg/dL (ref 0–200)
HDL: 27.6 mg/dL — ABNORMAL LOW (ref >39.00)
LDL Cholesterol: 81 mg/dL (ref 0–99)
NonHDL: 103.34
Total CHOL/HDL Ratio: 5
Triglycerides: 113 mg/dL (ref 0.0–149.0)
VLDL: 22.6 mg/dL (ref 0.0–40.0)

## 2020-08-02 LAB — HEPATIC FUNCTION PANEL
ALT: 16 U/L (ref 0–53)
AST: 13 U/L (ref 0–37)
Albumin: 4 g/dL (ref 3.5–5.2)
Alkaline Phosphatase: 51 U/L (ref 39–117)
Bilirubin, Direct: 0.1 mg/dL (ref 0.0–0.3)
Total Bilirubin: 0.4 mg/dL (ref 0.2–1.2)
Total Protein: 6.7 g/dL (ref 6.0–8.3)

## 2020-08-02 LAB — HEMOGLOBIN A1C: Hgb A1c MFr Bld: 5.7 % (ref 4.6–6.5)

## 2020-08-02 LAB — TSH: TSH: 3.55 u[IU]/mL (ref 0.35–4.50)

## 2020-08-02 LAB — T4, FREE: Free T4: 0.8 ng/dL (ref 0.60–1.60)

## 2020-08-03 LAB — PSA, TOTAL WITH REFLEX TO PSA, FREE: PSA, Total: 0.7 ng/mL (ref <=4.0)

## 2020-08-06 ENCOUNTER — Encounter: Payer: 59 | Admitting: Family Medicine

## 2020-08-09 ENCOUNTER — Encounter: Payer: 59 | Admitting: Family Medicine

## 2020-08-26 ENCOUNTER — Encounter: Payer: Self-pay | Admitting: Family Medicine

## 2020-08-26 NOTE — Progress Notes (Signed)
Brandon Warshaw T. Kiwan Gadsden, MD, [place] at [facility] [place], [address]  Phone: [phone]  FAX: [place] - 55 y.o. male  MRN [id number]  Date of Birth: 17-Nov-1965  Date: 08/27/2020  PCP: Owens Loffler, MD  Referral: Owens Loffler, MD  Chief Complaint  Patient presents with  . Annual Exam    This visit occurred during the SARS-CoV-2 public health emergency.  Safety protocols were in place, including screening questions prior to the visit, additional usage of staff PPE, and extensive cleaning of exam room while observing appropriate contact time as indicated for disinfecting solutions.   Patient Care Team: Owens Loffler, MD as PCP - General Rockey Situ, Kathlene November, MD as Consulting Physician (Cardiology) Subjective:   Brandon [name] is a 54 y.o. pleasant patient who presents with the following:  Preventative Health Maintenance Visit:  Health Maintenance Summary Reviewed and updated, unless pt declines services.  Tobacco History Reviewed.he does dip, and he will use a tin in about 2-3 days Alcohol: No concerns, no excessive use Exercise Habits: Some activity, rec at least 30 mins 5 times a week -he is active on his job, but otherwise does not get any physical activity. STD concerns: no risk or activity to increase risk Drug Use: None  Wt Readings from Last 3 Encounters:  08/27/20 (!) 343 lb 8 oz (155.8 kg)  05/30/20 (!) 345 lb 4 oz (156.6 kg)  01/26/20 (!) 340 lb 12 oz (154.6 kg)    He is hesitant somewhat about getting his third Covid shot.  Health Maintenance  Topic Date Due  . TETANUS/TDAP  12/04/2020  . COLONOSCOPY  06/19/2022  . INFLUENZA VACCINE  Completed  . COVID-19 Vaccine  Completed  . Hepatitis C Screening  Completed  . HIV Screening  Completed   Immunization History  Administered Date(s) Administered  . Influenza Whole  11/15/2009, 07/14/2011  . Influenza, Seasonal, Injecte, Preservative Fre 11/08/2012  . Influenza,inj,Quad PF,6+ Mos 08/24/2013, 08/16/2014, 08/20/2015, 06/23/2016, 10/08/2017, 08/19/2018, 08/01/2019, 08/27/2020  . PFIZER SARS-COV-2 Vaccination 02/01/2020, 02/22/2020  . Td 12/04/2010   Patient Active Problem List   Diagnosis Date Noted  . Ulcerative colitis with complication ([place]) 63/14/9702  . GERD (gastroesophageal reflux disease) 05/13/2016  . Ventricular ectopy 12/14/2015  . Cervical disc disorder with radiculopathy of cervical region 10/05/2013  . Carpal tunnel syndrome 10/05/2013  . Essential hypertension 06/16/2013  . 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 Basal cell carcinoma 06/25/2011  . Morbid obesity with BMI of 40.0-44.9, adult ([place]) 06/07/2009  . ATRIAL FIBRILLATION, PAROXYSMAL 06/07/2009  . DIVERTICULITIS, COLON 06/07/2009  . ARTHRITIS 06/07/2009    Past Medical History:  Diagnosis Date  . ARTHRITIS 06/07/2009  . ATRIAL FIBRILLATION, PAROXYSMAL 06/07/2009  . Cervical disc disorder with radiculopathy of cervical region 10/05/2013  . 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 Colon polyp   . DEPRESSION 06/07/2009  . DIVERTICULITIS, COLON 06/07/2009  . GERD (gastroesophageal reflux disease)   . Ulcerative colitis ([place]) 04/28/2018    Past Surgical History:  Procedure Laterality Date  . CARDIAC CATHETERIZATION    . COLONOSCOPY    . COLONOSCOPY WITH PROPOFOL N/A 04/20/2018   Procedure: COLONOSCOPY WITH PROPOFOL;  Surgeon: Lollie Sails, MD;  Location: [facility] ENDOSCOPY;  Service: Endoscopy;  Laterality: N/A;  . COLONOSCOPY WITH PROPOFOL N/A 06/20/2019   Procedure: COLONOSCOPY WITH PROPOFOL;  Surgeon: Lollie Sails, MD;  Location: [facility] ENDOSCOPY;  Service: Endoscopy;  Laterality:  N/A;  . FOOT SURGERY     left foot  . ROTATOR CUFF REPAIR    . SHOULDER ARTHROSCOPY WITH SUBACROMIAL DECOMPRESSION AND BICEP TENDON REPAIR Right 12/14/2018   Procedure: SHOULDER ARTHROSCOPY WITH EBRIDEMENT,  DECOMPRESSION, DISTAL CLAVICLE EXCISION, BICEP TENODESIS AND DEBRIDEMENT, AND REPAIR OF ROTATOR CUFF TEAR;  Surgeon: Corky Mull, MD;  Location: [facility] ORS;  Service: Orthopedics;  Laterality: Right;  . TONSILLECTOMY      Family History  Problem Relation Age of Onset  . Stroke Father   . Arthritis Other   . Diabetes Other        1st degree relative  . Stroke Other        F 1st degree relative <60; M 1st degree relative <50    Past Medical History, Surgical History, Social History, Family History, Problem List, Medications, and Allergies have been reviewed and updated if relevant.  Review of Systems: Pertinent positives are listed above.  Otherwise, a full 14 point review of systems has been done in full and it is negative except where it is noted positive.  Objective:   BP 122/86   Pulse 68   Temp 98 F (36.7 C) (Temporal)   Ht 6' 1.5" (1.867 m)   Wt (!) 343 lb 8 oz (155.8 kg)   SpO2 98%   BMI 44.70 kg/m  Ideal Body Weight: Weight in (lb) to have BMI = 25: 191.7  Ideal Body Weight: Weight in (lb) to have BMI = 25: 191.7 No exam data present Depression screen [facility] 2/9 08/01/2019 05/17/2018 04/29/2017  Decreased Interest 0 0 0  Down, Depressed, Hopeless 0 0 0  PHQ - 2 Score 0 0 0     GEN: well developed, well nourished, no acute distress Eyes: conjunctiva and lids normal, PERRLA, EOMI ENT: TM clear, nares clear, oral exam WNL Neck: supple, no lymphadenopathy, no thyromegaly, no JVD Pulm: clear to auscultation and percussion, respiratory effort normal CV: regular rate and rhythm, S1-S2, no murmur, rub or gallop, no bruits, peripheral pulses normal and symmetric, no cyanosis, clubbing, edema or varicosities GI: soft, non-tender; no hepatosplenomegaly, masses; active bowel sounds all quadrants GU: deferred Lymph: no cervical, axillary or inguinal adenopathy MSK: gait normal, muscle tone and strength WNL, no joint swelling, effusions, discoloration, crepitus  SKIN: clear, good  turgor, color WNL, no rashes, lesions, or ulcerations Neuro: normal mental status, normal strength, sensation, and motion Psych: alert; oriented to person, place and time, normally interactive and not anxious or depressed in appearance.  All labs reviewed with patient. Results for orders placed or performed in visit on 08/02/20  T4, free  Result Value Ref Range   Free T4 0.80 0.60 - 1.60 ng/dL  T3, free  Result Value Ref Range   T3, Free 3.2 2.3 - 4.2 pg/mL  TSH  Result Value Ref Range   TSH 3.55 0.35 - 4.50 uIU/mL  PSA, Total with Reflex to PSA, Free  Result Value Ref Range   PSA, Total 0.7 < OR = 4.0 ng/mL  Hemoglobin A1c  Result Value Ref Range   Hgb A1c MFr Bld 5.7 4.6 - 6.5 %  Basic metabolic panel  Result Value Ref Range   Sodium 139 135 - 145 mEq/L   Potassium 4.3 3.5 - 5.1 mEq/L   Chloride 104 96 - 112 mEq/L   CO2 30 19 - 32 mEq/L   Glucose, Bld 95 70 - 99 mg/dL   BUN 12 6 - 23 mg/dL   Creatinine, Ser 0.94  0.40 - 1.50 mg/dL   GFR 82.85 >60.00 mL/min   Calcium 8.9 8.4 - 10.5 mg/dL  Hepatic function panel  Result Value Ref Range   Total Bilirubin 0.4 0.2 - 1.2 mg/dL   Bilirubin, Direct 0.1 0.0 - 0.3 mg/dL   Alkaline Phosphatase 51 39 - 117 U/L   AST 13 0 - 37 U/L   ALT 16 0 - 53 U/L   Total Protein 6.7 6.0 - 8.3 g/dL   Albumin 4.0 3.5 - 5.2 g/dL  CBC with Differential/Platelet  Result Value Ref Range   WBC 8.7 4.0 - 10.5 K/uL   RBC 5.18 4.22 - 5.81 Mil/uL   Hemoglobin 14.4 13.0 - 17.0 g/dL   HCT 44.3 39 - 52 %   MCV 85.5 78.0 - 100.0 fl   MCHC 32.6 30.0 - 36.0 g/dL   RDW 15.2 11.5 - 15.5 %   Platelets 260.0 150 - 400 K/uL   Neutrophils Relative % 68.1 43 - 77 %   Lymphocytes Relative 22.5 12 - 46 %   Monocytes Relative 6.5 3 - 12 %   Eosinophils Relative 2.1 0 - 5 %   Basophils Relative 0.8 0 - 3 %   Neutro Abs 5.9 1.4 - 7.7 K/uL   Lymphs Abs 1.9 0.7 - 4.0 K/uL   Monocytes Absolute 0.6 0.1 - 1.0 K/uL   Eosinophils Absolute 0.2 0.0 - 0.7 K/uL    Basophils Absolute 0.1 0.0 - 0.1 K/uL  Lipid panel  Result Value Ref Range   Cholesterol 131 0 - 200 mg/dL   Triglycerides 113.0 0 - 149 mg/dL   HDL 27.60 (L) >39.00 mg/dL   VLDL 22.6 0.0 - 40.0 mg/dL   LDL Cholesterol 81 0 - 99 mg/dL   Total CHOL/HDL Ratio 5    NonHDL 103.34     Assessment and Plan:     ICD-10-CM   1. Healthcare maintenance  Z00.00   2. Need for influenza vaccination  Z23 Flu Vaccine QUAD 6+ mos PF IM (Fluarix Quad PF)   He is generally doing okay.  He understands that his primary issues to work on are his weight.  Also strongly recommended his Covid vaccine #3.  Health Maintenance Exam: The patient's preventative maintenance and recommended screening tests for an annual wellness exam were reviewed in full today. Brought up to date unless services declined.  Counselled on the importance of diet, exercise, and its role in overall health and mortality. The patient's FH and SH was reviewed, including their home life, tobacco status, and drug and alcohol status.  Follow-up in 1 year for physical exam or additional follow-up below.  Follow-up: No follow-ups on file. Or follow-up in 1 year if not noted.  No orders of the defined types were placed in this encounter.  Medications Discontinued During This Encounter  Medication Reason  . gabapentin (NEURONTIN) 300 MG capsule Completed Course   Orders Placed This Encounter  Procedures  . Flu Vaccine QUAD 6+ mos PF IM (Fluarix Quad PF)    Signed,  William Schake T. Stevie Ertle, MD   Allergies as of 08/27/2020      Reactions   Biaxin [clarithromycin] Other (See Comments)   Pt has history of arrhythmias   Penicillins Rash   Did it involve swelling of the face/tongue/throat, SOB, or low BP? Unknown Did it involve sudden or severe rash/hives, skin peeling, or any reaction on the inside of your mouth or nose? Unknown Did you need to seek medical attention at a hospital  or doctor's office? Unknown When did it last  happen?childhood allergy If all above answers are "NO", may proceed with cephalosporin use.      Medication List       Accurate as of August 27, 2020  2:07 PM. If you have any questions, ask your nurse or doctor.        STOP taking these medications   gabapentin 300 MG capsule Commonly known as: NEURONTIN Stopped by: Owens Loffler, MD     TAKE these medications   acetaminophen 500 MG tablet Commonly known as: TYLENOL Take 1,000 mg by mouth every 6 (six) hours as needed.   diazepam 5 MG tablet Commonly known as: VALIUM Take 1 tablet (5 mg total) by mouth every 8 (eight) hours as needed for anxiety.   diltiazem 180 MG 24 hr capsule Commonly known as: CARDIZEM CD TAKE 1 CAPSULE BY MOUTH EVERY DAY   flecainide 100 MG tablet Commonly known as: TAMBOCOR TAKE 1 TABLET BY MOUTH TWICE A DAY   losartan 25 MG tablet Commonly known as: COZAAR TAKE 1 TABLET BY MOUTH EVERY DAY   omeprazole 40 MG capsule Commonly known as: PRILOSEC Take 40 mg by mouth every other day.

## 2020-08-27 ENCOUNTER — Encounter: Payer: Self-pay | Admitting: Family Medicine

## 2020-08-27 ENCOUNTER — Other Ambulatory Visit: Payer: Self-pay

## 2020-08-27 ENCOUNTER — Ambulatory Visit (INDEPENDENT_AMBULATORY_CARE_PROVIDER_SITE_OTHER): Payer: 59 | Admitting: Family Medicine

## 2020-08-27 VITALS — BP 122/86 | HR 68 | Temp 98.0°F | Ht 73.5 in | Wt 343.5 lb

## 2020-08-27 DIAGNOSIS — Z23 Encounter for immunization: Secondary | ICD-10-CM

## 2020-08-27 DIAGNOSIS — Z0000 Encounter for general adult medical examination without abnormal findings: Secondary | ICD-10-CM | POA: Diagnosis not present

## 2020-09-12 ENCOUNTER — Other Ambulatory Visit: Payer: Self-pay | Admitting: Surgery

## 2020-09-21 ENCOUNTER — Encounter (HOSPITAL_COMMUNITY): Payer: Self-pay | Admitting: Urgent Care

## 2020-09-21 ENCOUNTER — Other Ambulatory Visit: Payer: Self-pay

## 2020-09-21 ENCOUNTER — Encounter
Admission: RE | Admit: 2020-09-21 | Discharge: 2020-09-21 | Disposition: A | Discharge status: Home / Self Care | Payer: 59 | Source: Ambulatory Visit | Attending: Surgery | Admitting: Surgery

## 2020-09-21 DIAGNOSIS — M75122 Complete rotator cuff tear or rupture of left shoulder, not specified as traumatic: Secondary | ICD-10-CM | POA: Insufficient documentation

## 2020-09-21 DIAGNOSIS — Z01818 Encounter for other preprocedural examination: Secondary | ICD-10-CM | POA: Insufficient documentation

## 2020-09-21 HISTORY — DX: Essential (primary) hypertension: I10

## 2020-09-21 HISTORY — DX: Unspecified osteoarthritis, unspecified site: M19.90

## 2020-09-21 NOTE — Progress Notes (Signed)
[facility] Perioperative Services: Pre-Admission/Anesthesia Testing   Date: 09/21/20 Name: Khaiden Segreto MRN:   [id number]  Re: Consideration of preoperative prophylactic antibiotic change   Request sent to: Poggi, Marshall Cork, MD (routed and/or faxed via [facility])  Planned Surgical Procedure(s):    Case: [id number] Date/Time: 10/04/20 0715   Procedure: LEFT SHOULDER ARTHROSCOPY WITH DEBRIDEMENT, DECOMPRESSION, ROTATOR CUFF REPAIR, BICEPS TENODESIS, AND DISTAL CLAVICLE EXCISION (Left Shoulder)   Anesthesia type: Choice   Pre-op diagnosis:      Rotator cuff tendinitis, left M75.82     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 Patient has a documented allergy to PCN  . Advising that PCN has caused him to experience low severity rash in the past.  . This was a childhood allergy.   2. Received cephalosporin with no documented complications . CEFUROXIME received on 02/02/2015  3. Screened as appropriate for cephalosporin use during medication reconciliation . No immediate angioedema, dysphagia, SOB, anaphylaxis symptoms. . No severe rash involving mucous membranes or skin necrosis. . No hospital admissions related to side effects of PCN/cephalosporin use.  . No documented reaction to PCN or cephalosporin in the last 10 years.  Request:  As an evidence based approach to reducing the rate of incidence for post-operative SSI and the development of MDROs, could an agent with narrower coverage for preoperative prophylaxis in this patient's upcoming surgical course be considered?  1. Currently ordered preoperative prophylactic ABX: clindamycin.   2. Specifically requesting change to cephalosporin (CEFAZOLIN).   3. Please communicate decision with me and I will change the orders in Epic as per  your direction.   Things to consider:  Many patients report that they were "allergic" to PCN earlier in life, however this does not translate into a true lifelong allergy. Patients can lose sensitivity to specific IgE antibodies over time if PCN is avoided (Kleris & Lugar, 2019).   Up to 10% of the adult population and 15% of hospitalized patients report an allergy to PCN, however clinical studies suggest that 90% of those reporting an allergy can tolerate PCN antibiotics (Kleris & Lugar, 2019).   Cross-sensitivity between PCN and cephalosporins has been documented as being as high as 10%, however this estimation included data believed to have been collected in a setting where there was contamination. Newer data suggests that the prevalence of cross-sensitivity between PCN and cephalosporins is actually estimated to be closer to 1% (Hermanides et al., 2018).    Patients labeled as PCN allergic, whether they are truly allergic or not, have been found to have inferior outcomes in terms of rates of serious infection, and these patients tend to have longer hospital stays ([place], 2019).   Treatment related secondary infections, such as Clostridioides difficile, have been linked to the improper use of broad spectrum antibiotics in patients improperly labeled as PCN allergic (Kleris & Lugar, 2019).   Anaphylaxis from cephalosporins is rare and the evidence suggests that there is no increased risk of an anaphylactic type reaction when cephalosporins are used in a PCN allergic patient (Pichichero, 2006).  Citations: Hermanides J, Lemkes BA, Prins Pearla Dubonnet MW, Terreehorst I. Presumed ?-Lactam Allergy and Cross-reactivity in the Operating Theater: A Practical Approach. Anesthesiology. 2018 Aug;129(2):335-342. doi: 10.1097/ALN.0000000000002252. PMID: 14970263.  Kleris,  R. S., & Lugar, P. L. (2019). Things We Do For No Reason: Failing to Question a Penicillin Allergy History. Journal of hospital  medicine, 14(10), [phone]. Advance online publication. https://www.wallace-middleton.info/  Pichichero, M. E. (2006). Cephalosporins can be prescribed safely for penicillin-allergic patients. Journal of family medicine, 55(2), 106-112. Accessed: https://cdn.mdedge.com/files/s18f-public/Document/September-2017/5502JFP_AppliedEvidence1.pdf   BHonor Loh MSN, APRN, FNP-C, CEN C[facility] Per[facility] Nurse Practitioner 09/21/20 10:50 AM

## 2020-09-21 NOTE — Patient Instructions (Addendum)
Your procedure is scheduled on:10-04-20 THURSDAY Report to the Registration Desk on the 1st floor of the [facility]-Then proceed to the 2nd floor Surgery Desk To find out your arrival time, please call [phone] between 1PM - 3PM on:10-03-20 WEDNESDAY  REMEMBER: Instructions that are not followed completely may result in serious medical risk, up to and including death; or upon the discretion of your surgeon and anesthesiologist your surgery may need to be rescheduled.  Do not eat food after midnight the night before surgery.  No gum chewing, lozengers or hard candies.  You may however, drink CLEAR liquids up to 2 hours before you are scheduled to arrive for your surgery. Do not drink anything within 2 hours of your scheduled arrival time.  Clear liquids include: - water  - apple juice without pulp - gatorade (not RED, PURPLE, OR BLUE) - black coffee or tea (Do NOT add milk or creamers to the coffee or tea) Do NOT drink anything that is not on this list.  In addition, your doctor has ordered for you to drink the provided  Ensure Pre-Surgery Clear Carbohydrate Drink  Drinking this carbohydrate drink up to two hours before surgery helps to reduce insulin resistance and improve patient outcomes. Please complete drinking 2 hours prior to scheduled arrival time.  TAKE THESE MEDICATIONS THE MORNING OF SURGERY WITH A SIP OF WATER: -CARDIZEM (DILTIAZEM) -FLECAINIDE (TAMBOCOR) -PREDNISONE (DELTASONE) -PRILOSEC (OMEPRAZOLE)-take one the night before and one on the morning of surgery - helps to prevent nausea after surgery.)  One week prior to surgery: Stop Anti-inflammatories (NSAIDS) such as Advil, Aleve, Ibuprofen, Motrin, Naproxen, Naprosyn and Aspirin based products such as Excedrin, Goodys Powder, BC Powder-OK TO TAKE TYLENOL IF NEEDED  Stop ANY OVER THE COUNTER supplements until after surgery. (However, you may continue taking Vitamin D and Vitamin B12 up until the day before  surgery.)  No Alcohol for 24 hours before or after surgery.  No Smoking including e-cigarettes for 24 hours prior to surgery.  No chewable tobacco products for at least 6 hours prior to surgery.  No nicotine patches on the day of surgery.  Do not use any "recreational" drugs for at least a week prior to your surgery.  Please be advised that the combination of cocaine and anesthesia may have negative outcomes, up to and including death. If you test positive for cocaine, your surgery will be cancelled.  On the morning of surgery brush your teeth with toothpaste and water, you may rinse your mouth with mouthwash if you wish. Do not swallow any toothpaste or mouthwash.  Do not wear jewelry, make-up, hairpins, clips or nail polish.  Do not wear lotions, powders, or perfumes.   Do not shave body from the neck down 48 hours prior to surgery just in case you cut yourself which could leave a site for infection.  Also, freshly shaved skin may become irritated if using the CHG soap.  Contact lenses, hearing aids and dentures may not be worn into surgery.  Do not bring valuables to the hospital. [facility] is not responsible for any missing/lost belongings or valuables.   Use CHG Soap as directed on instruction sheet  Notify your doctor if there is any change in your medical condition (cold, fever, infection).  Wear comfortable clothing (specific to your surgery type) to the hospital.  Plan for stool softeners for home use; pain medications have a tendency to cause constipation. You can also help prevent constipation by eating foods high in fiber such  as fruits and vegetables and drinking plenty of fluids as your diet allows.  After surgery, you can help prevent lung complications by doing breathing exercises.  Take deep breaths and cough every 1-2 hours. Your doctor may order a device called an Incentive Spirometer to help you take deep breaths. When coughing or sneezing, hold a pillow  firmly against your incision with both hands. This is called "splinting." Doing this helps protect your incision. It also decreases belly discomfort.  If you are being admitted to the hospital overnight, leave your suitcase in the car. After surgery it may be brought to your room.  If you are being discharged the day of surgery, you will not be allowed to drive home. You will need a responsible adult (18 years or older) to drive you home and stay with you that night.   If you are taking public transportation, you will need to have a responsible adult (18 years or older) with you. Please confirm with your physician that it is acceptable to use public transportation.   Please call the [place] Dept. at [phone] if you have any questions about these instructions.  Visitation Policy:  Patients undergoing a surgery or procedure may have one family member or support person with them as long as that person is not COVID-19 positive or experiencing its symptoms.  That person may remain in the waiting area during the procedure.  Inpatient Visitation Update:   In an effort to ensure the safety of our team members and our patients, we are implementing a change to our visitation policy:  Effective Monday, Aug. 9, at 7 a.m., inpatients will be allowed one support person.  o The support person may change daily.  o The support person must pass our screening, gel in and out, and wear a mask at all times, including in the patient's room.  o Patients must also wear a mask when staff or their support person are in the room.  o Masking is required regardless of vaccination status.  Systemwide, no visitors 17 or younger.

## 2020-09-21 NOTE — Pre-Procedure Instructions (Signed)
Brandon Merritts, MD  Physician  Cardiology  Progress Notes    Signed  Encounter Date:  05/30/2020          Signed         Show:Clear all [x] Manual[x] Template[x] Copied  Added by: [x] Brandon Merritts, MD  [] Hover for details Cardiology Office Note  Date:  05/30/2020   ID:  [name], DOB [birth year], MRN [id number]  PCP:  Owens Loffler, MD       Chief Complaint  Patient presents with  . office visit    12 month F/U; Meds verbally reviewed with patient.    HPI:  Brandon [name] is a 54 -year-old gentleman with history of  hypertension,  obesity,  chronic back pain,  atrial fibrillation , previously declined anticoagulation Atrial fibrillation likely dating back to before 2012, appreciated fluttering, does not remember circumstances Remote history of PVCs Cardiac catheterization may 2012 showed no significant CAD, normal ejection fraction greater than 55%. who presents for routine followup of his atrial fibrillation, hypertension, morbid obesity.  Last seen in clinic by myself July 2019 At that time was having problems with diarrhea, ulcerative colitis, followed by GI  Seen by one of our providers September 2020 Weight up 342 pounds At that time had no new cardiac issues Weight had trended up over 20 pounds in 1 year  Not on anticoagulation secondary to Chads VASC 1 Preferred to stay on aspirin Tolerating diltiazem and flecainide  Denies any palpitations concerning for arrhythmia No regular exercise program Main issues on today's visit is his cervical and lower back pain  Back pain may stem from prior fall off a combine onto metal disc striking mid and lower lumbar spine in October 2017 Chronic pain since that time  He is a non-smoker, no diabetes, cholesterol at goal Blood pressure stable  EKG personally reviewed by myself on todays visit shows normal sinus rhythm rate 71 bpm no significant ST or T-wave changes  Other past  medical history reviewed 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  Cardiac catheterization may 2012 showed no significant CAD, normal ejection fraction greater than 55%. Cholesterol well controlled without any cluster medication  PMH:   has a past medical history of ARTHRITIS (06/07/2009), ATRIAL FIBRILLATION, PAROXYSMAL (06/07/2009), Cervical disc disorder with radiculopathy of cervical region (10/05/2013), CHICKENPOX, HX OF (06/07/2009), Classical migraine (03/03/2012), Colon polyp, DEPRESSION (06/07/2009), DIVERTICULITIS, COLON (06/07/2009), Dysrhythmia, GERD (gastroesophageal reflux disease), Hematuria, and Ulcerative colitis ([place]) (04/28/2018).  PSH:         Past Surgical History:  Procedure Laterality Date  . CARDIAC CATHETERIZATION    . COLONOSCOPY    . COLONOSCOPY WITH PROPOFOL N/A 04/20/2018   Procedure: COLONOSCOPY WITH PROPOFOL;  Surgeon: Lollie Sails, MD;  Location[facility] ENDOSCOPY;  Service: Endoscopy;  Laterality: N/A;  . COLONOSCOPY WITH PROPOFOL N/A 06/20/2019   Procedure: COLONOSCOPY WITH PROPOFOL;  Surgeon: Lollie Sails, MD;  Location: [facility] ENDOSCOPY;  Service: Endoscopy;  Laterality: N/A;  . FOOT SURGERY     left foot  . ROTATOR CUFF REPAIR    . SHOULDER ARTHROSCOPY WITH SUBACROMIAL DECOMPRESSION AND BICEP TENDON REPAIR Right 12/14/2018   Procedure: SHOULDER ARTHROSCOPY WITH EBRIDEMENT, DECOMPRESSION, DISTAL CLAVICLE EXCISION, BICEP TENODESIS AND DEBRIDEMENT, AND REPAIR OF ROTATOR CUFF TEAR;  Surgeon: Corky Mull, MD;  Location: [facility] ORS;  Service: Orthopedics;  Laterality: Right;  . TONSILLECTOMY    . TONSILLECTOMY  Current Outpatient Medications  Medication Sig Dispense Refill  . acetaminophen (TYLENOL) 500 MG tablet Take 1,000 mg by mouth every 6 (six) hours as needed.    . diazepam (VALIUM) 5 MG tablet Take 1 tablet (5 mg total) by mouth every 8 (eight) hours as needed for  anxiety. 30 tablet 0  . diltiazem (CARDIZEM CD) 180 MG 24 hr capsule TAKE 1 CAPSULE BY MOUTH EVERY DAY 90 capsule 3  . flecainide (TAMBOCOR) 100 MG tablet TAKE 1 TABLET BY MOUTH TWICE A DAY 180 tablet 3  . gabapentin (NEURONTIN) 300 MG capsule Take 300 mg by mouth 3 (three) times daily.     Marland Kitchen losartan (COZAAR) 25 MG tablet TAKE 1 TABLET BY MOUTH EVERY DAY 90 tablet 3  . omeprazole (PRILOSEC) 40 MG capsule Take 40 mg by mouth every other day.      No current facility-administered medications for this visit.    Allergies:   Biaxin [clarithromycin] and Penicillins   Social History:  The patient  reports that he has never smoked. His smokeless tobacco use includes snuff. He reports previous alcohol use. He reports that he does not use drugs.   Family History:   family history includes Arthritis in an other family member; Diabetes in an other family member; Stroke in his father and another family member.    Review of Systems: Review of Systems  Constitutional: Negative.   Respiratory: Negative.   Cardiovascular: Negative.   Gastrointestinal: Negative.  Negative for nausea.  Musculoskeletal: Positive for back pain.       Neck pain, low back pain  Neurological: Negative.   Psychiatric/Behavioral: Negative.   All other systems reviewed and are negative.   PHYSICAL EXAM: VS:  BP (!) 140/86 (BP Location: Left Arm, Patient Position: Sitting, Cuff Size: Large)   Pulse 71   Ht 6' 2"  (1.88 m)   Wt (!) 345 lb 4 oz (156.6 kg)   SpO2 95%   BMI 44.33 kg/m  , BMI Body mass index is 44.33 kg/m.  Constitutional:  oriented to person, place, and time. No distress.  HENT:  Head: Grossly normal Eyes:  no discharge. No scleral icterus.  Neck: No JVD, no carotid bruits  Cardiovascular: Regular rate and rhythm, no murmurs appreciated Pulmonary/Chest: Clear to auscultation bilaterally, no wheezes or rails Abdominal: Soft.  no distension.  no tenderness.  Musculoskeletal: Normal range  of motion Neurological:  normal muscle tone. Coordination normal. No atrophy Skin: Skin warm and dry Psychiatric: normal affect, pleasant  Recent Labs: 07/26/2019: ALT 18; BUN 10; Creatinine, Ser 0.90; Potassium 4.1; Sodium 139; TSH 2.35    Lipid Panel      Lab Results  Component Value Date   CHOL 131 07/26/2019   HDL 25.40 (L) 07/26/2019   LDLCALC 77 07/26/2019   TRIG 142.0 07/26/2019         Wt Readings from Last 3 Encounters:  05/30/20 (!) 345 lb 4 oz (156.6 kg)  01/26/20 (!) 340 lb 12 oz (154.6 kg)  09/26/19 (!) 347 lb 12 oz (157.7 kg)     ASSESSMENT AND PLAN:  Atrial fibrillation, Paroxysmal - Plan: EKG 12-Lead CHADSVASC with him, score of 1 Denies recent symptoms concerning for arrhythmia Continue diltiazem and flecainide.  Discussed risks and benefit of these medications, he reports feeling comfortable staying on the medications at this time Refills provided  Ulcerative colitis Previously reported bleeding diarrhea Improving on medication Stable at this time  Essential hypertension - Plan: EKG 12-Lead Previously noted to  have elevated diastolic pressure, improved on today's visit even with walking to the office No medication changes made  Morbid obesity with BMI of 40.0-44.9, adult ([place]) Works on the farm, tries to stay active despite back pain cervical and lower back Recommended dietary discretion, lifestyle modification  Ventricular ectopy Asymptomatic from PVCs   Total encounter time more than 25 minutes  Greater than 50% was spent in counseling and coordination of care with the patient       Orders Placed This Encounter  Procedures  . EKG 12-Lead     Signed, Esmond Plants, M.D., Ph.D. 05/30/2020  [place], [place]          Electronically signed by Brandon Merritts, MD at 05/30/2020 6:39 PM  Office Visit on 05/30/2020   Office Visit on 05/30/2020     Detailed  Report    Note shared with patient

## 2020-09-21 NOTE — Progress Notes (Signed)
[facility] Perioperative Services  Pre-Admission/Anesthesia Testing Clinical Review  Date: 09/24/20  Patient Demographics:  Name: Brandon [name] DOB:   1966/06/09 MRN:   [id number]  Planned Surgical Procedure(s):    Case: [id number] Date/Time: 10/04/20 0715   Procedure: LEFT SHOULDER ARTHROSCOPY WITH DEBRIDEMENT, DECOMPRESSION, ROTATOR CUFF REPAIR, BICEPS TENODESIS, AND DISTAL CLAVICLE EXCISION (Left Shoulder)   Anesthesia type: Choice   Pre-op diagnosis:      Rotator cuff tendinitis, left M75.82     Nontraumatic complete tear of left rotator cuff M75.122     Tendinitis of upper biceps tendon of left shoulder M75.22   Location: [facility] OR ROOM 03 / [facility]   Surgeons: Corky Mull, MD     NOTE: Available PAT nursing documentation and vital signs have been reviewed. Clinical nursing staff has updated patient's PMH/PSHx, current medication list, and drug allergies/intolerances to ensure comprehensive history available to assist in medical decision making as it pertains to the aforementioned surgical procedure and anticipated anesthetic course.   Clinical Discussion:  Brandon [name] is a 54 y.o. male who is submitted for pre-surgical anesthesia review and clearance prior to him undergoing the above procedure. Patient has never been a smoker. Pertinent PMH includes: Paroxysmal A. fib, HTN, GERD (on daily PPI therapy), OA, cervical disc disorder with radiculopathy, migraines, depression.  Patient is followed by cardiology Rockey Situ, MD ). He was last seen in the cardiology clinic on 05/30/2020; notes reviewed.  At the time of his clinic visit, patient doing well from a cardiovascular standpoint.  He denied any angina or anginal equivalent symptoms.  Patient's main concerns during his clinic visit were neck and lower back pain.  PMH (+) for atrial fibrillation; CHA2DS2-VASc Score = 1.  Patient has declined oral anticoagulation therapy.  Cardiac  catheterization performed in 03/2011 revealing no significant CAD and a normal LVEF of >55%.  Note that the results of the diagnostic LHC are not available for review, and information regarding the procedure was obtained from cardiology office notes. Blood pressure under fair control at 140/86 on ARB monotherapy.  Patient also on diltiazem and flecainide for rate control.  Patient is not diabetic.  Last lipid screening normal.  Patient with functional capacity documented as being >4 METS.  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  Weight loss via lifestyle modification and dietary discretion recommended.  Patient to follow-up with outpatient cardiology in 1 year, or sooner if needed.  Patient scheduled to undergo LEFT shoulder arthroscopy and rotator cuff repair on 10/04/2020 with Dr. Jenny Reichmann pathology.  Given patient's past medical history significant for atrial fibrillation, hypertension, and morbid obesity, presurgical cardiac clearance was sought by the PAT team.  Per cardiology, "based on ACC/AHA guidelines, Brandon [name] would be at acceptable risk for the planned procedure without further cardiovascular testing:. This patient is not on daily anticoagulation therapy.   He denies previous perioperative complications with anesthesia. He underwent a general anesthetic course here (ASA II) in 06/2019 with no documented complications.  Again, patient has a history of cervical disc disease.  Patient had MRI of the cervical spine performed in 08/2019.  Imaging results included below for review by the anesthesia team prior to intubation and in the event that any neuraxial anesthesia is considered as part of his plan of care.  Vitals with BMI 08/27/2020 05/30/2020 01/26/2020  Height 6' 1.5" 6' 2"  5' 11.75"  Weight 343 lbs  8 oz 345 lbs 4 oz 340 lbs 12 oz  BMI 44.7 38.93 73.42  Systolic 876 811 572  Diastolic 86 86 90  Pulse 68 71 69     Providers/Specialists:   NOTE: Primary physician provider listed below. Patient may have been seen by APP or partner within same practice.   PROVIDER ROLE LAST OV  Poggi, Marshall Cork, MD Orthopedic (Surgeon)  07/30/2020  Owens Loffler, MD Primary Care Provider  08/27/2020  Ida Rogue, MD Cardiology  05/30/2020   Allergies:  Biaxin [clarithromycin] and Penicillins  Current Home Medications:   No current facility-administered medications for this encounter.   Marland Kitchen acetaminophen (TYLENOL) 500 MG tablet  . balsalazide (COLAZAL) 750 MG capsule  . cholecalciferol (VITAMIN D3) 25 MCG (1000 UNIT) tablet  . cyanocobalamin (,VITAMIN B-12,) 1000 MCG/ML injection  . diltiazem (CARDIZEM CD) 180 MG 24 hr capsule  . flecainide (TAMBOCOR) 100 MG tablet  . losartan (COZAAR) 25 MG tablet  . omeprazole (PRILOSEC) 40 MG capsule  . predniSONE (DELTASONE) 10 MG tablet  . diazepam (VALIUM) 5 MG tablet   History:   Past Medical History:  Diagnosis Date  . ARTHRITIS 06/07/2009  . Arthritis   . ATRIAL FIBRILLATION, PAROXYSMAL 06/07/2009  . Cervical disc disorder with radiculopathy of cervical region 10/05/2013  . 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 Colon polyp   . DEPRESSION 06/07/2009  . DIVERTICULITIS, COLON 06/07/2009  . GERD (gastroesophageal reflux disease)   . Hypertension   . Ulcerative colitis ([place]) 04/28/2018   Past Surgical History:  Procedure Laterality Date  . CARDIAC CATHETERIZATION    . COLONOSCOPY    . COLONOSCOPY WITH PROPOFOL N/A 04/20/2018   Procedure: COLONOSCOPY WITH PROPOFOL;  Surgeon: Lollie Sails, MD;  Location: [facility] ENDOSCOPY;  Service: Endoscopy;  Laterality: N/A;  . COLONOSCOPY WITH PROPOFOL N/A 06/20/2019   Procedure: COLONOSCOPY WITH PROPOFOL;  Surgeon: Lollie Sails, MD;  Location: [facility] ENDOSCOPY;  Service: Endoscopy;  Laterality: N/A;  . FOOT SURGERY     left foot  . ROTATOR CUFF REPAIR    . SHOULDER ARTHROSCOPY WITH SUBACROMIAL DECOMPRESSION AND BICEP TENDON  REPAIR Right 12/14/2018   Procedure: SHOULDER ARTHROSCOPY WITH EBRIDEMENT, DECOMPRESSION, DISTAL CLAVICLE EXCISION, BICEP TENODESIS AND DEBRIDEMENT, AND REPAIR OF ROTATOR CUFF TEAR;  Surgeon: Corky Mull, MD;  Location: [facility] ORS;  Service: Orthopedics;  Laterality: Right;  . TONSILLECTOMY     Family History  Problem Relation Age of Onset  . Stroke Father   . Arthritis Other   . Diabetes Other        1st degree relative  . Stroke Other        F 1st degree relative <60; M 1st degree relative <50   Social History   Tobacco Use  . Smoking status: Never Smoker  . Smokeless tobacco: Current User    Types: Snuff  . Tobacco comment: occasionally  Vaping Use  . Vaping Use: Never used  Substance Use Topics  . Alcohol use: Not Currently    Alcohol/week: 0.0 standard drinks  . Drug use: Never    Pertinent Clinical Results:  LABS: Labs reviewed: Acceptable for surgery.  No visits with results within 3 Day(s) from this visit.  Latest known visit with results is:  Lab on 08/02/2020  Component Date Value Ref Range Status  . Free T4 08/02/2020 0.80  0.60 - 1.60 ng/dL Final   Comment: Specimens from patients who are undergoing biotin therapy and /or ingesting biotin supplements may contain high levels of biotin.  The higher biotin concentration in these specimens interferes with this Free T4 assay.  Specimens that contain high levels  of biotin may cause false high results for this Free T4 assay.  Please interpret results in light of the total clinical presentation of the patient.    . T3, Free 08/02/2020 3.2  2.3 - 4.2 pg/mL Final  . TSH 08/02/2020 3.55  0.35 - 4.50 uIU/mL Final  . PSA, Total 08/02/2020 0.7  < OR = 4.0 ng/mL Final   Comment: The Total PSA value from this assay system is  standardized against the equimolar PSA standard.  The test result will be approximately 20% higher  when compared to the [facility] Total PSA  (Siemens assay). Comparison of serial PSA results    should be interpreted with this fact in mind. Marland Kitchen PSA was performed using the Beckman Coulter  Immunoassay method. Values obtained from different  assay methods cannot be used interchangeably. PSA  levels, regardless of value, should not be  interpreted as absolute evidence of the presence or  absence of disease.   . Hgb A1c MFr Bld 08/02/2020 5.7  4.6 - 6.5 % Final   Glycemic Control Guidelines for People with Diabetes:Non Diabetic:  <6%Goal of Therapy: <7%Additional Action Suggested:  >8%   . Sodium 08/02/2020 139  135 - 145 mEq/L Final  . Potassium 08/02/2020 4.3  3.5 - 5.1 mEq/L Final  . Chloride 08/02/2020 104  96 - 112 mEq/L Final  . CO2 08/02/2020 30  19 - 32 mEq/L Final  . Glucose, Bld 08/02/2020 95  70 - 99 mg/dL Final  . BUN 08/02/2020 12  6 - 23 mg/dL Final  . Creatinine, Ser 08/02/2020 0.94  0.40 - 1.50 mg/dL Final  . GFR 08/02/2020 82.85  >60.00 mL/min Final  . Calcium 08/02/2020 8.9  8.4 - 10.5 mg/dL Final  . Total Bilirubin 08/02/2020 0.4  0.2 - 1.2 mg/dL Final  . Bilirubin, Direct 08/02/2020 0.1  0.0 - 0.3 mg/dL Final  . Alkaline Phosphatase 08/02/2020 51  39 - 117 U/L Final  . AST 08/02/2020 13  0 - 37 U/L Final  . ALT 08/02/2020 16  0 - 53 U/L Final  . Total Protein 08/02/2020 6.7  6.0 - 8.3 g/dL Final  . Albumin 08/02/2020 4.0  3.5 - 5.2 g/dL Final  . WBC 08/02/2020 8.7  4.0 - 10.5 K/uL Final  . RBC 08/02/2020 5.18  4.22 - 5.81 Mil/uL Final  . Hemoglobin 08/02/2020 14.4  13.0 - 17.0 g/dL Final  . HCT 08/02/2020 44.3  39 - 52 % Final  . MCV 08/02/2020 85.5  78.0 - 100.0 fl Final  . MCHC 08/02/2020 32.6  30.0 - 36.0 g/dL Final  . RDW 08/02/2020 15.2  11.5 - 15.5 % Final  . Platelets 08/02/2020 260.0  150 - 400 K/uL Final  . Neutrophils Relative % 08/02/2020 68.1  43 - 77 % Final  . Lymphocytes Relative 08/02/2020 22.5  12 - 46 % Final  . Monocytes Relative 08/02/2020 6.5  3 - 12 % Final  . Eosinophils Relative 08/02/2020 2.1  0 - 5 % Final  . Basophils Relative  08/02/2020 0.8  0 - 3 % Final  . Neutro Abs 08/02/2020 5.9  1.4 - 7.7 K/uL Final  . Lymphs Abs 08/02/2020 1.9  0.7 - 4.0 K/uL Final  . Monocytes Absolute 08/02/2020 0.6  0.1 - 1.0 K/uL Final  . Eosinophils Absolute 08/02/2020 0.2  0.0 - 0.7 K/uL Final  . Basophils Absolute  08/02/2020 0.1  0.0 - 0.1 K/uL Final  . Cholesterol 08/02/2020 131  0 - 200 mg/dL Final   ATP III Classification       Desirable:  < 200 mg/dL               Borderline High:  200 - 239 mg/dL          High:  > = 240 mg/dL  . Triglycerides 08/02/2020 113.0  0 - 149 mg/dL Final   Normal:  <150 mg/dLBorderline High:  150 - 199 mg/dL  . HDL 08/02/2020 27.60* >39.00 mg/dL Final  . VLDL 08/02/2020 22.6  0.0 - 40.0 mg/dL Final  . LDL Cholesterol 08/02/2020 81  0 - 99 mg/dL Final  . Total CHOL/HDL Ratio 08/02/2020 5   Final                  Men          Women1/2 Average Risk     3.4          3.3Average Risk          5.0          4.42X Average Risk          9.6          7.13X Average Risk          15.0          11.0                      . NonHDL 08/02/2020 103.34   Final   NOTE:  Non-HDL goal should be 30 mg/dL higher than patient's LDL goal (i.e. LDL goal of < 70 mg/dL, would have non-HDL goal of < 100 mg/dL)    ECG: Date: 05/30/2020 Time ECG obtained: 1512 PM Rate: 71 bpm Rhythm: normal sinus Axis (leads I and aVF): Normal Normal (up/up).Marland KitchenMarland KitchenLEFT axis (up/down)...RIGHT axis (down/up).Marland KitchenMarland KitchenEXTREME axis (down/down) Intervals: PR 176 ms. QRS 90 ms. QTc 432 ms. ST segment and T wave changes: No evidence of acute ST segment elevation or depression Comparison: Similar to previous tracing obtained on 12/03/2018   IMAGING / PROCEDURES: MRI SHOULDER LEFT WO CONTRAST done on 07/19/2020 1. Moderate tendinosis of the supraspinatus tendon with a partial-thickness bursal surface tear and a small full-thickness component anteriorly. 2. Mild tendinosis of the infraspinatus tendon. 3. Mild tendinosis of the intra-articular portion of the long  head of the biceps tendon.  MRI CERVICAL SPINE WO CONTRAST done on 08/16/2019 1. Severe left foraminal narrowing at C5-6 due to uncovertebral disease is worse than on the prior MRI. There is mild central canal stenosis at C5-6. 2. Mild-to-moderate right foraminal narrowing at C3-4 is worse than on the prior MRI. The central canal and left foramen are open. 3. No marked change in right greater than left moderate to moderately severe bilateral foraminal narrowing at C6-7 where the ventral thecal sac is effaced by disc.   Impression and Plan:  Brandon [name] has been referred for pre-anesthesia review and clearance prior to him undergoing the planned anesthetic and procedural courses. Available labs, pertinent testing, and imaging results were personally reviewed by me. This patient has been appropriately cleared by cardiology.   Based on clinical review performed today (09/24/20), barring any significant acute changes in the patient's overall condition, it is anticipated that he will be able to proceed with the planned surgical intervention. Any acute changes in clinical condition may necessitate his procedure being postponed and/or  cancelled. Pre-surgical instructions were reviewed with the patient during his PAT appointment and questions were fielded by PAT clinical staff.  Honor Loh, MSN, APRN, FNP-C, CEN [facility]  Peri-operative Services Nurse Practitioner Phone: [phone] 09/24/20 2:56 PM  NOTE: This note has been prepared using Dragon dictation software. Despite my best ability to proofread, there is always the potential that unintentional transcriptional errors may still occur from this process.

## 2020-09-24 ENCOUNTER — Telehealth: Payer: Self-pay | Admitting: *Deleted

## 2020-09-24 NOTE — Telephone Encounter (Signed)
° °  [facility] Pre-operative Risk Assessment    HEARTCARE STAFF: - Please ensure there is not already an duplicate clearance open for this procedure. - Under Visit Info/Reason for Call, type in Other and utilize the format Clearance MM/DD/YY or Clearance TBD. Do not use dashes or single digits. - If request is for dental extraction, please clarify the # of teeth to be extracted.  Request for surgical clearance:   1. What type of surgery is being performed? LEFT SHOULDER ARTHROSCOPY w/DEBRIDEMENT, DECOMPRESSION, ROTATOR CUFF REPAIR, BICEPS TENODESIS, DISTAL CLAVICLE EXCISION     2. When is this surgery scheduled? 10/04/20   3. What type of clearance is required (medical clearance vs. Pharmacy clearance to hold med vs. Both)? MEDICAL  4. Are there any medications that need to be held prior to surgery and how long? NONE LISTED    5. Practice name and name of physician performing surgery? DR. Jenny Reichmann [facility]   6. What is the office phone number? [phone]   7.   What is the office fax number? [phone]  8.   Anesthesia type (None, local, MAC, general) ? GENERAL   Julaine Hua 09/24/2020, 1:28 PM  _________________________________________________________________   (provider comments below)

## 2020-09-24 NOTE — Telephone Encounter (Signed)
   Primary Cardiologist: Dr. Rockey Situ  Chart reviewed as part of pre-operative protocol coverage. Patient was last seen by Dr. Rockey Situ in 05/2020 at which time he was doing well from a cardiac standpoint. Patient was contacted today for further pre-op evaluation at which time he reported doing well since last visit. No chest pain, shortness of breath, orthopnea, PND, edema, palpitations, lightheadedness, dizziness, or syncope. He is able to complete >4.0 METS without any problems. Given past medical history and time since last visit, based on ACC/AHA guidelines, Brandon [name] would be at acceptable risk for the planned procedure without further cardiovascular testing.   The patient was advised that if he develops new symptoms prior to surgery to contact our office to arrange for a follow-up visit, and he verbalized understanding.  I will route this recommendation to the requesting party via Epic fax function and remove from pre-op pool.  Please call with questions.  Darreld Mclean, PA-C 09/24/2020, 2:35 PM

## 2020-10-02 ENCOUNTER — Other Ambulatory Visit: Payer: 59

## 2020-10-04 ENCOUNTER — Ambulatory Visit: Admission: RE | Admit: 2020-10-04 | Payer: 59 | Source: Home / Self Care | Admitting: Surgery

## 2020-10-04 ENCOUNTER — Encounter: Admission: RE | Payer: Self-pay | Source: Home / Self Care

## 2020-10-04 ENCOUNTER — Other Ambulatory Visit
Admission: RE | Admit: 2020-10-04 | Discharge: 2020-10-04 | Disposition: A | Discharge status: Home / Self Care | Payer: 59 | Source: Ambulatory Visit | Attending: Gastroenterology | Admitting: Gastroenterology

## 2020-10-04 DIAGNOSIS — R197 Diarrhea, unspecified: Secondary | ICD-10-CM | POA: Insufficient documentation

## 2020-10-04 DIAGNOSIS — K51919 Ulcerative colitis, unspecified with unspecified complications: Secondary | ICD-10-CM | POA: Diagnosis present

## 2020-10-04 LAB — GASTROINTESTINAL PANEL BY PCR, STOOL (REPLACES STOOL CULTURE)

## 2020-10-04 LAB — C DIFFICILE QUICK SCREEN W PCR REFLEX
C Diff antigen: NEGATIVE
C Diff interpretation: NOT DETECTED
C Diff toxin: NEGATIVE

## 2020-10-04 SURGERY — SHOULDER ARTHROSCOPY WITH SUBACROMIAL DECOMPRESSION, ROTATOR CUFF REPAIR AND BICEP TENDON REPAIR
Anesthesia: Choice | Site: Shoulder | Laterality: Left

## 2020-10-09 LAB — CALPROTECTIN, FECAL: Calprotectin, Fecal: 725 ug/g — ABNORMAL HIGH (ref 0–120)

## 2020-10-10 LAB — GIARDIA, EIA; OVA/PARASITE: Giardia Ag, Stl: NEGATIVE

## 2020-10-10 LAB — O&P RESULT

## 2020-12-19 ENCOUNTER — Other Ambulatory Visit: Payer: Self-pay

## 2020-12-19 NOTE — Telephone Encounter (Signed)
Refill request diazepam Last refill 01/26/20 #30 Last office visit 08/27/20 Upcoming appointment 12/24/20

## 2020-12-20 MED ORDER — DIAZEPAM 5 MG PO TABS: 5.0000 mg | ORAL_TABLET | Freq: Three times a day (TID) | ORAL | 0 refills | Status: DC | PRN

## 2020-12-24 ENCOUNTER — Other Ambulatory Visit: Payer: Self-pay

## 2020-12-24 ENCOUNTER — Ambulatory Visit (INDEPENDENT_AMBULATORY_CARE_PROVIDER_SITE_OTHER): Payer: 59 | Admitting: Family Medicine

## 2020-12-24 ENCOUNTER — Encounter: Payer: Self-pay | Admitting: Family Medicine

## 2020-12-24 VITALS — BP 140/80 | HR 75 | Temp 97.1°F | Ht 73.5 in | Wt 339.2 lb

## 2020-12-24 DIAGNOSIS — R350 Frequency of micturition: Secondary | ICD-10-CM | POA: Diagnosis not present

## 2020-12-24 DIAGNOSIS — R39198 Other difficulties with micturition: Secondary | ICD-10-CM

## 2020-12-24 LAB — POC URINALSYSI DIPSTICK (AUTOMATED)
Bilirubin, UA: NEGATIVE
Blood, UA: NEGATIVE
Glucose, UA: NEGATIVE
Ketones, UA: NEGATIVE
Leukocytes, UA: NEGATIVE
Nitrite, UA: NEGATIVE
Protein, UA: NEGATIVE
Spec Grav, UA: 1.01 (ref 1.010–1.025)
Urobilinogen, UA: 0.2 E.U./dL
pH, UA: 7 (ref 5.0–8.0)

## 2020-12-24 MED ORDER — TAMSULOSIN HCL 0.4 MG PO CAPS: 0.4000 mg | ORAL_CAPSULE | Freq: Every day | ORAL | 3 refills | Status: DC

## 2020-12-24 NOTE — Progress Notes (Signed)
Nazareth Kirk T. Tierre Gerard, MD, [place] [facility] [place], [address]  Phone: [phone]  FAX: [place] - 55 y.o. male  MRN [id number]  Date of Birth: 09/28/1966  Date: 12/24/2020  PCP: Owens Loffler, MD  Referral: Owens Loffler, MD  Chief Complaint  Patient presents with  . Urinary Retention    This visit occurred during the SARS-CoV-2 public health emergency.  Safety protocols were in place, including screening questions prior to the visit, additional usage of staff PPE, and extensive cleaning of exam room while observing appropriate contact time as indicated for disinfecting solutions.   Subjective:   Brandon [name] is a 55 y.o. very pleasant male patient with Body mass index is 44.15 kg/m. who presents with the following:  The last few dys has been ok.  A lot of pressure and at one time it did hurt.  He has had some urgency when he has to go to the bathroom.  He did have some pain once, and he has not noticed any blood.  This is been going on for 3 to 4 weeks only.  He denies any cough or cold medication or new over-the-counter's with the exception of some turmeric.  The only recent medication is the Colazal for ulcerative colitis, and he has otherwise been on his medications for years.  No new sexual encounters.  Coming out it will hurt some.    Nothing external  Has been ongoing for 3 weeks    Review of Systems is noted in the HPI, as appropriate  Objective:   BP 140/80   Pulse 75   Temp (!) 97.1 F (36.2 C) (Temporal)   Ht 6' 1.5" (1.867 m)   Wt (!) 339 lb 4 oz (153.9 kg)   SpO2 94%   BMI 44.15 kg/m   GEN: No acute distress; alert,appropriate. PULM: Breathing comfortably in no respiratory distress PSYCH: Normally interactive.  Normal male GU exam with no rashes or pain along the shaft, or testicles.  Laboratory and  Imaging Data:  Assessment and Plan:     ICD-10-CM   1. Urinary frequency  R35.0   2. Difficulty urinating  R39.198 POCT Urinalysis Dipstick (Automated)    Urine Culture   Unclear origin.  Check culture.  For now do a trial of some Flomax to see if this helps.  He has had a normal PSA in the last year.  Meds ordered this encounter  Medications  . tamsulosin (FLOMAX) 0.4 MG CAPS capsule    Sig: Take 1 capsule (0.4 mg total) by mouth daily.    Dispense:  30 capsule    Refill:  3   Medications Discontinued During This Encounter  Medication Reason  . predniSONE (DELTASONE) 10 MG tablet Completed Course   Orders Placed This Encounter  Procedures  . Urine Culture  . POCT Urinalysis Dipstick (Automated)    Follow-up: No follow-ups on file.  Signed,  Maud Deed. Dywane Peruski, MD   Outpatient Encounter Medications as of 12/24/2020  Medication Sig  . acetaminophen (TYLENOL) 500 MG tablet Take 1,000 mg by mouth every 6 (six) hours as needed for moderate pain.   . balsalazide (COLAZAL) 750 MG capsule Take 2,250 mg by mouth 3 (three) times daily.  . cholecalciferol (VITAMIN D3) 25 MCG (1000 UNIT) tablet Take 1,000 Units by mouth daily.  . cyanocobalamin (,VITAMIN B-12,) 1000 MCG/ML injection Inject 1,000 mcg into  the muscle every 7 (seven) days.  . diazepam (VALIUM) 5 MG tablet Take 1 tablet (5 mg total) by mouth every 8 (eight) hours as needed for anxiety.  Marland Kitchen diltiazem (CARDIZEM CD) 180 MG 24 hr capsule TAKE 1 CAPSULE BY MOUTH EVERY DAY  . flecainide (TAMBOCOR) 100 MG tablet TAKE 1 TABLET BY MOUTH TWICE A DAY  . losartan (COZAAR) 25 MG tablet TAKE 1 TABLET BY MOUTH EVERY DAY  . omeprazole (PRILOSEC) 40 MG capsule Take 40 mg by mouth 3 (three) times a week.   . SYRINGE-NEEDLE, DISP, 3 ML (B-D 3CC LUER-LOK SYR 22GX1") 22G X 1" 3 ML MISC USE TO INJECT B 12 INJECTION  . tamsulosin (FLOMAX) 0.4 MG CAPS capsule Take 1 capsule (0.4 mg total) by mouth daily.  . [DISCONTINUED] predniSONE  (DELTASONE) 10 MG tablet Take 40 mg by mouth daily with breakfast. 3 WEEK TAPER FOR ULCERATIVE COLITIS   No facility-administered encounter medications on file as of 12/24/2020.

## 2020-12-25 LAB — URINE CULTURE
MICRO NUMBER:: 11558613
Result:: NO GROWTH
SPECIMEN QUALITY:: ADEQUATE

## 2021-01-24 ENCOUNTER — Other Ambulatory Visit: Payer: Self-pay | Admitting: Surgery

## 2021-02-04 ENCOUNTER — Other Ambulatory Visit
Admission: RE | Admit: 2021-02-04 | Discharge: 2021-02-04 | Disposition: A | Discharge status: Home / Self Care | Payer: 59 | Source: Ambulatory Visit | Attending: Surgery | Admitting: Surgery

## 2021-02-04 ENCOUNTER — Other Ambulatory Visit: Payer: Self-pay

## 2021-02-04 ENCOUNTER — Telehealth: Payer: Self-pay | Admitting: *Deleted

## 2021-02-04 NOTE — Telephone Encounter (Signed)
   Patient Name: Brandon [name]  DOB: November 01, 1966  MRN: [id number]   Primary Cardiologist: Dr. Rockey Situ  Chart reviewed as part of pre-operative protocol coverage. Patient has not been seen in the last 6 months. Because of Brandon [name]'s past medical history and time since last visit, he will require a follow-up visit in order to better assess preoperative cardiovascular risk.  Pre-op covering staff: - Please schedule appointment and call patient to inform them. If patient already had an upcoming appointment within acceptable timeframe, please add "pre-op clearance" to the appointment notes so provider is aware. - Please contact requesting surgeon's office via preferred method (i.e, phone, fax) to inform them of need for appointment prior to surgery.  If applicable, this message will also be routed to pharmacy pool and/or primary cardiologist for input on holding anticoagulant/antiplatelet agent as requested below so that this information is available to the clearing provider at time of patient's appointment.   Brandon Mclean, PA-C  02/04/2021, 2:23 PM

## 2021-02-04 NOTE — Patient Instructions (Signed)
Your procedure is scheduled on: Tuesday February 12, 2021. Report to Day Surgery inside [facility] 2nd floor (stop by Admissions desk first before getting on the elevator). To find out your arrival time please call [phone] between 1PM - 3PM on Monday February 11, 2021.  Remember: Instructions that are not followed completely may result in serious medical risk,  up to and including death, or upon the discretion of your surgeon and anesthesiologist your  surgery may need to be rescheduled.     _X__ 1. Do not eat food after midnight the night before your procedure.                 No chewing gum or hard candies. You may drink clear liquids up to 2 hours                 before you are scheduled to arrive for your surgery- DO not drink clear                 liquids within 2 hours of the start of your surgery.                 Clear Liquids include:  water, apple juice without pulp, clear Gatorade, G2 or                  Gatorade Zero (avoid Red/Purple/Blue), Black Coffee or Tea (Do not add                 anything to coffee or tea).  __X__2.   Complete the "Ensure Clear Pre-surgery Clear Carbohydrate Drink" provided to you, 2 hours before arrival. **If you are diabetic you will be provided with an alternative drink, Gatorade Zero or G2.  __X__3.  On the morning of surgery brush your teeth with toothpaste and water, you                may rinse your mouth with mouthwash if you wish.  Do not swallow any toothpaste of mouthwash.     _X__ 4.  No Alcohol for 24 hours before or after surgery.   _X__ 5.  Do Not Smoke or use e-cigarettes For 24 Hours Prior to Your Surgery.                 Do not use any chewable tobacco products for at least 6 hours prior to                 Surgery.  _X__  6.  Do not use any recreational drugs (marijuana, cocaine, heroin, ecstasy, MDMA or other)                For at least one week prior to your surgery.  Combination of these drugs with  anesthesia                May have life threatening results.  __X__7.  Notify your doctor if there is any change in your medical condition      (cold, fever, infections).     Do not wear jewelry, make-up, hairpins, clips or nail polish. Do not wear lotions, powders, or perfumes. You may wear deodorant. Do not shave 48 hours prior to surgery. Men may shave face and neck. Do not bring valuables to the hospital.    [facility] is not responsible for any belongings or valuables.  Contacts, dentures or bridgework may not be worn into surgery. Leave your suitcase in the car. After surgery  it may be brought to your room. For patients admitted to the hospital, discharge time is determined by your treatment team.   Patients discharged the day of surgery will not be allowed to drive home.   Make arrangements for someone to be with you for the first 24 hours of your Same Day Discharge.   __X__ Take these medicines the morning of surgery with A SIP OF WATER:    1. flecainide (TAMBOCOR) 100 MG   2. diltiazem (CARDIZEM CD) 180 MG  3. omeprazole (PRILOSEC) 20 MG  4.  5.  6.  ____ Fleet Enema (as directed)   __X__ Use CHG Soap (or wipes) as directed  ____ Use Benzoyl Peroxide Gel as instructed  ____ Use inhalers on the day of surgery  ____ Stop metformin 2 days prior to surgery    ____ Take 1/2 of usual insulin dose the night before surgery. No insulin the morning          of surgery.   ____ Stop Coumadin/Plavix/aspirin   __X__ Stop Anti-inflammatories such as Ibuprofen, Aleve, Advil, naproxen, Motrin, aspirin and or BC or Goody powders.   __X__ Stop supplements until after surgery.  vitamin C (ASCORBIC ACID) 500 MG and TURMERIC PO  __X__ Do not start any herbal supplements before your procedure.     If you have any questions regarding your pre-procedure instructions,  Please call Pre-admit Testing at [phone].

## 2021-02-04 NOTE — Telephone Encounter (Signed)
Pt call with appointment schedule on 04/07 @ 9:30 am with Christell Faith PA

## 2021-02-04 NOTE — Telephone Encounter (Signed)
Request for pre-operative cardiac clearance Received: Today Karen Kitchens, NP  P [facility] Request for pre-operative cardiac clearance:    1. What type of surgery is being performed?  LEFT LEFT SHOULDER ARTHROSCOPY WITH DEBRIDEMENT, DECOMPRESSION, ROTATOR CUFF REPAIR, BICEPS TENODESIS, AND DISTAL CLAVICLE EXCISION   2. When is this surgery scheduled?  02/12/2021    3. Are there any medications that need to be held prior to surgery?  NONE   4. Practice name and name of physician performing surgery?  Performing surgeon: Dr. Milagros Evener, MD  Requesting clearance: Honor Loh, FNP-C     5. Anesthesia type (none, local, MAC, general)? General   6. What is the office phone and fax number?   Phone: [phone]  Fax: [phone]   ATTENTION: Unable to create telephone message as per your standard workflow. Directed by [facility] providers to send requests for cardiac clearance to this pool for appropriate distribution to provider covering pre-operative clearances.   Honor Loh, MSN, APRN, FNP-C, CEN  [facility]  Peri-operative Services Nurse Practitioner  Phone: [phone]  02/04/21 11:31 AM

## 2021-02-05 NOTE — Progress Notes (Signed)
Cardiology Office Note    Date:  02/07/2021   ID:  [name], DOB Mar 24, 1966, MRN [id number]  PCP:  Owens Loffler, MD  Cardiologist:  Ida Rogue, MD  Electrophysiologist:  None   Chief Complaint: Preoperative cardiac risk stratification   History of Present Illness:   Brandon [name] is a 55 y.o. male with history of nonobstructive CAD by [place] in 2012 with EF > 55%, PAF previously declined [place], PVCs, HTN, ulcerative colitis, obesity, chronic back pain, and arthritis who presents for preoperative cardiac risk stratification for upcoming left shoulder arthroscopy surgery, scheduled for 02/12/2021.   Prior cardiac cath in 03/2011 demonstrated nonobstructive CAD with details unclear.  No further ischemic evaluation since.  His Afib dates back to at least 2012. He has been maintained on full dose ASA along with flecainide with his preference to continue this regimen. CHADS2VASc at least 1 (HTN). He was seen in the office in 05/2018 for routine follow up. He denied any palpitations. It was recommended he stop ASA with prior GI bleeding with ulcerative colitis flares. He was last seen in the office in 05/2020 and was doing well from a cardiac perspective with no changes made at that time.   He comes in doing well from a cardiac perspective.  No chest pain, dyspnea, palpitations, dizziness, presyncope, syncope, lower extremity swelling, orthopnea.  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  Ulcerative colitis remains well controlled without active flares recently.  Revised Cardiac Risk Index: Low risk for noncardiac surgery Duke Activity Status Index: Able to achieve greater than 4 METs without cardiac limitation   Labs independently reviewed: 09/2020 - HGB 14.1, PLT 257 07/2020 - albumin 4.0, AST/ALT normal, potassium 4.3, BUN 12, SCr 0.94, A1c 5.7, TSH normal 07/2019 - TC 131, TG 142, HDL 25, LDL 77 05/2019 - magnesium 2.0  Past Medical History:   Diagnosis Date  . ARTHRITIS 06/07/2009  . Arthritis   . ATRIAL FIBRILLATION, PAROXYSMAL 06/07/2009  . Cervical disc disorder with radiculopathy of cervical region 10/05/2013  . 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 Colon polyp   . DEPRESSION 06/07/2009  . DIVERTICULITIS, COLON 06/07/2009  . GERD (gastroesophageal reflux disease)   . Hypertension   . Ulcerative colitis ([place]) 04/28/2018    Past Surgical History:  Procedure Laterality Date  . CARDIAC CATHETERIZATION    . COLONOSCOPY    . COLONOSCOPY WITH PROPOFOL N/A 04/20/2018   Procedure: COLONOSCOPY WITH PROPOFOL;  Surgeon: Lollie Sails, MD;  Location: [facility] ENDOSCOPY;  Service: Endoscopy;  Laterality: N/A;  . COLONOSCOPY WITH PROPOFOL N/A 06/20/2019   Procedure: COLONOSCOPY WITH PROPOFOL;  Surgeon: Lollie Sails, MD;  Location: [facility] ENDOSCOPY;  Service: Endoscopy;  Laterality: N/A;  . FOOT SURGERY     left foot  . ROTATOR CUFF REPAIR Right   . SHOULDER ARTHROSCOPY WITH SUBACROMIAL DECOMPRESSION AND BICEP TENDON REPAIR Right 12/14/2018   Procedure: SHOULDER ARTHROSCOPY WITH EBRIDEMENT, DECOMPRESSION, DISTAL CLAVICLE EXCISION, BICEP TENODESIS AND DEBRIDEMENT, AND REPAIR OF ROTATOR CUFF TEAR;  Surgeon: Corky Mull, MD;  Location: [facility] ORS;  Service: Orthopedics;  Laterality: Right;  . TONSILLECTOMY      Current Medications: Current Meds  Medication Sig  . acetaminophen (TYLENOL) 500 MG tablet Take 1,000 mg by mouth every 6 (six) hours as needed for moderate pain.   . balsalazide (COLAZAL) 750 MG capsule Take 2,250 mg by mouth 3 (three) times daily.  . cyanocobalamin (,VITAMIN B-12,) 1000 MCG/ML injection Inject 1,000 mcg into  the muscle every 30 (thirty) days.  . diazepam (VALIUM) 5 MG tablet Take 1 tablet (5 mg total) by mouth every 8 (eight) hours as needed for anxiety.  Marland Kitchen diltiazem (CARDIZEM CD) 180 MG 24 hr capsule TAKE 1 CAPSULE BY MOUTH EVERY DAY  . flecainide (TAMBOCOR) 100 MG tablet TAKE 1 TABLET BY MOUTH TWICE A DAY  .  losartan (COZAAR) 25 MG tablet TAKE 1 TABLET BY MOUTH EVERY DAY  . mesalamine (ROWASA) 4 g enema Place 4 g rectally daily as needed (colitis flare).  Marland Kitchen omeprazole (PRILOSEC) 20 MG capsule Take 20 mg by mouth daily.  . SYRINGE-NEEDLE, DISP, 3 ML (B-D 3CC LUER-LOK SYR 22GX1") 22G X 1" 3 ML MISC USE TO INJECT B 12 INJECTION  . TURMERIC PO Take 1,000 mg by mouth 2 (two) times daily.  . vitamin C (ASCORBIC ACID) 500 MG tablet Take 500 mg by mouth 2 (two) times daily.  . Vitamin D, Cholecalciferol, 10 MCG (400 UNIT) CAPS Take 400 Units by mouth 2 (two) times daily.    Allergies:   Biaxin [clarithromycin] and Penicillins   Social History   Socioeconomic History  . Marital status: Single    Spouse name: Not on file  . Number of children: Not on file  . Years of education: Not on file  . Highest education level: Not on file  Occupational History  . Occupation: self employed    Fish farm manager: [employer]  Tobacco Use  . Smoking status: Never Smoker  . Smokeless tobacco: Current User    Types: Snuff  . Tobacco comment: occasionally  Vaping Use  . Vaping Use: Never used  Substance and Sexual Activity  . Alcohol use: Not Currently    Alcohol/week: 0.0 standard drinks  . Drug use: Never  . Sexual activity: Not on file  Other Topics Concern  . Not on file  Social History Narrative   Does not do regular exercise.    Social Determinants of Health   Financial Resource Strain: Not on file  Food Insecurity: Not on file  Transportation Needs: Not on file  Physical Activity: Not on file  Stress: Not on file  Social Connections: Not on file     Family History:  The patient's family history includes Arthritis in an other family member; Diabetes in an other family member; Stroke in his father and another family member.  ROS:   Review of Systems  Constitutional: Negative for chills, diaphoresis, fever, malaise/fatigue and weight loss.  HENT: Negative for congestion.   Eyes: Negative for  discharge and redness.  Respiratory: Negative for cough, sputum production, shortness of breath and wheezing.   Cardiovascular: Negative for chest pain, palpitations, orthopnea, claudication, leg swelling and PND.  Gastrointestinal: Negative for abdominal pain, blood in stool, heartburn, melena, nausea and vomiting.  Musculoskeletal: Positive for joint pain. Negative for falls and myalgias.       Left shoulder discomfort  Skin: Negative for rash.  Neurological: Negative for dizziness, tingling, tremors, sensory change, speech change, focal weakness, loss of consciousness and weakness.  Endo/Heme/Allergies: Does not bruise/bleed easily.  Psychiatric/Behavioral: Negative for substance abuse. The patient is not nervous/anxious.   All other systems reviewed and are negative.    EKGs/Labs/Other Studies Reviewed:    Studies reviewed were summarized above. The additional studies were reviewed today: As above.   EKG:  EKG is ordered today.  The EKG ordered today demonstrates NSR, 74 bpm, low voltage QRS, no acute ST-T changes, no acute changes when compared to  prior tracing  Recent Labs: 08/02/2020: ALT 16; BUN 12; Creatinine, Ser 0.94; Hemoglobin 14.4; Platelets 260.0; Potassium 4.3; Sodium 139; TSH 3.55  Recent Lipid Panel    Component Value Date/Time   CHOL 131 08/02/2020 0813   TRIG 113.0 08/02/2020 0813   HDL 27.60 (L) 08/02/2020 0813   CHOLHDL 5 08/02/2020 0813   VLDL 22.6 08/02/2020 0813   LDLCALC 81 08/02/2020 0813    PHYSICAL EXAM:    VS:  BP 120/84 (BP Location: Right Arm, Patient Position: Sitting, Cuff Size: Large)   Pulse 74   Ht 6' 2"  (1.88 m)   Wt (!) 337 lb (152.9 kg)   SpO2 96%   BMI 43.27 kg/m   BMI: Body mass index is 43.27 kg/m.  Physical Exam Vitals reviewed.  Constitutional:      Appearance: He is well-developed.  HENT:     Head: Normocephalic and atraumatic.  Eyes:     General:        Right eye: No discharge.        Left eye: No discharge.  Neck:      Vascular: No JVD.  Cardiovascular:     Rate and Rhythm: Normal rate and regular rhythm.     Pulses: No midsystolic click and no opening snap.          Posterior tibial pulses are 2+ on the right side and 2+ on the left side.     Heart sounds: Normal heart sounds, S1 normal and S2 normal. Heart sounds not distant. No murmur heard. No friction rub.  Pulmonary:     Effort: Pulmonary effort is normal. No respiratory distress.     Breath sounds: Normal breath sounds. No decreased breath sounds, wheezing or rales.  Chest:     Chest wall: No tenderness.  Abdominal:     General: There is no distension.     Palpations: Abdomen is soft.     Tenderness: There is no abdominal tenderness.  Musculoskeletal:     Cervical back: Normal range of motion.  Skin:    General: Skin is warm and dry.     Nails: There is no clubbing.  Neurological:     Mental Status: He is alert and oriented to person, place, and time.  Psychiatric:        Speech: Speech normal.        Behavior: Behavior normal.        Thought Content: Thought content normal.        Judgment: Judgment normal.     Wt Readings from Last 3 Encounters:  02/07/21 (!) 337 lb (152.9 kg)  02/04/21 (!) 339 lb (153.8 kg)  12/24/20 (!) 339 lb 4 oz (153.9 kg)     ASSESSMENT & PLAN:   1. Preoperative cardiac risk stratification: He is scheduled for a left shoulder arthroscopy on 02/12/2021.  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  Per Revised Cardiac Risk Index, he is low risk for noncardiac surgery.  Per Duke Activity Status Index, he is able to achieve greater than 4 METs without cardiac limitation.  He may proceed with noncardiac surgery at an overall low risk without further cardiac evaluation or testing.  2. 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  No longer on aspirin secondary to ulcerative colitis flares.  Otherwise, he will continue current medical  therapy.  No indication for ischemic testing at this time.  3. PAF: Maintaining sinus rhythm on  diltiazem and flecainide. CHA2DS2-VASc at least 1 (HTN).  Historically he has declined [place] and continues to do so.  He will be having blood work drawn on 4/8 as part of the preoperative process.  It looks like this will include a CBC and BMP.  I will place a future order for a magnesium level to be drawn at that same time.  Please forward the results to me.    4. HTN: Blood pressure is well controlled in the office.  He remains on Cardizem CD and losartan.  5. PVCs: Quiescent.  He remains on Cardizem CD.  6. Obesity: Weight loss advised.  This was not discussed in today's visit.  7. Ulcerative colitis: Stable without recent exacerbation.  Followed by GI.  Disposition: F/u with Dr. Rockey Situ or an APP in 6 months, sooner if needed.   Medication Adjustments/Labs and Tests Ordered: Current medicines are reviewed at length with the patient today.  Concerns regarding medicines are outlined above. Medication changes, Labs and Tests ordered today are summarized above and listed in the Patient Instructions accessible in Encounters.   Signed, Christell Faith, PA-C 02/07/2021 10:23 AM     [place] [address] [place] [place] [address] [place], [place] [address] [phone]

## 2021-02-07 ENCOUNTER — Other Ambulatory Visit: Payer: Self-pay

## 2021-02-07 ENCOUNTER — Encounter: Payer: Self-pay | Admitting: Physician Assistant

## 2021-02-07 ENCOUNTER — Ambulatory Visit (INDEPENDENT_AMBULATORY_CARE_PROVIDER_SITE_OTHER): Payer: 59 | Admitting: Physician Assistant

## 2021-02-07 VITALS — BP 120/84 | HR 74 | Ht 74.0 in | Wt 337.0 lb

## 2021-02-07 DIAGNOSIS — I2510 Atherosclerotic heart disease of native coronary artery without angina pectoris: Secondary | ICD-10-CM

## 2021-02-07 DIAGNOSIS — I10 Essential (primary) hypertension: Secondary | ICD-10-CM

## 2021-02-07 DIAGNOSIS — I480 Paroxysmal atrial fibrillation: Secondary | ICD-10-CM

## 2021-02-07 DIAGNOSIS — Z01810 Encounter for preprocedural cardiovascular examination: Secondary | ICD-10-CM

## 2021-02-07 DIAGNOSIS — I493 Ventricular premature depolarization: Secondary | ICD-10-CM

## 2021-02-07 DIAGNOSIS — K51919 Ulcerative colitis, unspecified with unspecified complications: Secondary | ICD-10-CM

## 2021-02-07 NOTE — Patient Instructions (Addendum)
Medication Instructions:  No changes at this time.   *If you need a refill on your cardiac medications before your next appointment, please call your pharmacy*   Lab Work: Mag level to be done with labs drawn Pre-Op labs  If you have labs (blood work) drawn today and your tests are completely normal, you will receive your results only by: Marland Kitchen MyChart Message (if you have MyChart) OR . A paper copy in the mail If you have any lab test that is abnormal or we need to change your treatment, we will call you to review the results.   Testing/Procedures: None   Follow-Up: At [facility], you and your health needs are our priority.  As part of our continuing mission to provide you with exceptional heart care, we have created designated Provider Care Teams.  These Care Teams include your primary Cardiologist (physician) and Advanced Practice Providers (APPs -  Physician Assistants and Nurse Practitioners) who all work together to provide you with the care you need, when you need it.  Your next appointment:   6 month(s)  The format for your next appointment:   In Person  Provider:   Ida Rogue, MD or Christell Faith, PA-C

## 2021-02-08 ENCOUNTER — Other Ambulatory Visit: Payer: 59

## 2021-02-08 ENCOUNTER — Other Ambulatory Visit
Admission: RE | Admit: 2021-02-08 | Discharge: 2021-02-08 | Disposition: A | Discharge status: Home / Self Care | Payer: 59 | Source: Ambulatory Visit | Attending: Surgery | Admitting: Surgery

## 2021-02-08 DIAGNOSIS — Z20822 Contact with and (suspected) exposure to covid-19: Secondary | ICD-10-CM | POA: Diagnosis not present

## 2021-02-08 DIAGNOSIS — Z01812 Encounter for preprocedural laboratory examination: Secondary | ICD-10-CM | POA: Insufficient documentation

## 2021-02-08 LAB — BASIC METABOLIC PANEL
Anion gap: 8 (ref 5–15)
BUN: 13 mg/dL (ref 6–20)
CO2: 24 mmol/L (ref 22–32)
Calcium: 8.8 mg/dL — ABNORMAL LOW (ref 8.9–10.3)
Chloride: 106 mmol/L (ref 98–111)
Creatinine, Ser: 0.69 mg/dL (ref 0.61–1.24)
GFR, Estimated: >60 mL/min (ref >60)
Glucose, Bld: 94 mg/dL (ref 70–99)
Potassium: 3.7 mmol/L (ref 3.5–5.1)
Sodium: 138 mmol/L (ref 135–145)

## 2021-02-08 LAB — MAGNESIUM: Magnesium: 2 mg/dL (ref 1.7–2.4)

## 2021-02-08 LAB — CBC
HCT: 42.7 % (ref 39.0–52.0)
Hemoglobin: 13.9 g/dL (ref 13.0–17.0)
MCH: 26.9 pg (ref 26.0–34.0)
MCHC: 32.6 g/dL (ref 30.0–36.0)
MCV: 82.6 fL (ref 80.0–100.0)
Platelets: 279 10*3/uL (ref 150–400)
RBC: 5.17 MIL/uL (ref 4.22–5.81)
RDW: 14.4 % (ref 11.5–15.5)
WBC: 8.2 10*3/uL (ref 4.0–10.5)
nRBC: 0 % (ref 0.0–0.2)

## 2021-02-08 LAB — SARS CORONAVIRUS 2 (TAT 6-24 HRS): SARS Coronavirus 2: NEGATIVE

## 2021-02-08 NOTE — Progress Notes (Signed)
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 Milagros Evener.  Of note, patient previously reviewed for preanesthesia clearance by PAT APP, however patient elected to cancel his procedure due to GI issues. See more detailed review note dated 09/24/2020.   Interval medical history reviewed.  Patient successfully treated for Campylobacter infection.  GI issues have improved.  No other significant changes to patient's medical history.  He was seen in follow-up consult on 02/07/2021 by cardiology provider; notes reviewed.  Patient doing well from a cardiovascular perspective.  No angina/anginal equivalent symptoms.  Functional capacity >/= 4 METS without cardiac limitation.  Cardiology issued clearance to proceed with planned elective orthopedic procedure with an overall LOW risk stratification for significant perioperative cardiovascular complications.  Preoperative work-up updated as follows:  Hospital Outpatient Visit on 02/08/2021  Component Date Value Ref Range Status  . Sodium 02/08/2021 138  135 - 145 mmol/L Final  . Potassium 02/08/2021 3.7  3.5 - 5.1 mmol/L Final  . Chloride 02/08/2021 106  98 - 111 mmol/L Final  . CO2 02/08/2021 24  22 - 32 mmol/L Final  . Glucose, Bld 02/08/2021 94  70 - 99 mg/dL Final   Glucose reference  range applies only to samples taken after fasting for at least 8 hours.  . BUN 02/08/2021 13  6 - 20 mg/dL Final  . Creatinine, Ser 02/08/2021 0.69  0.61 - 1.24 mg/dL Final  . Calcium 02/08/2021 8.8* 8.9 - 10.3 mg/dL Final  . GFR, Estimated 02/08/2021 >60  >60 mL/min Final   Comment: (NOTE) Calculated using the CKD-EPI Creatinine Equation (2021)   . Anion gap 02/08/2021 8  5 - 15 Final   Performed at [facility], [address] [place], [place] [address]  . WBC 02/08/2021 8.2  4.0 - 10.5 K/uL Final  . RBC 02/08/2021 5.17  4.22 - 5.81 MIL/uL Final  . Hemoglobin 02/08/2021 13.9  13.0 - 17.0 g/dL Final  . HCT 02/08/2021 42.7  39.0 - 52.0 % Final  . MCV 02/08/2021 82.6  80.0 - 100.0 fL Final  . MCH 02/08/2021 26.9  26.0 - 34.0 pg Final  . MCHC 02/08/2021 32.6  30.0 - 36.0 g/dL Final  . RDW 02/08/2021 14.4  11.5 - 15.5 % Final  . Platelets 02/08/2021 279  150 - 400 K/uL Final  . nRBC 02/08/2021 0.0  0.0 - 0.2 % Final   Performed at [facility], [address], [place], [place] [address]  . Magnesium 02/08/2021 2.0  1.7 - 2.4 mg/dL Final   Performed at [facility], [address] [place], [place] [address]   ECG: Date: 02/07/2021 Time ECG obtained:  Rate: 74 bpm Rhythm: normal sinus rhythm Axis (leads I and aVF): Normal ST segment and T wave changes: No evidence of ST  segment elevation or depression Comparison: Similar to previous tracing obtained on 0/28/2021  Impression and Plan:  Brandon [name] has been referred for pre-anesthesia review and clearance prior to him undergoing the planned anesthetic and procedural courses. Available labs, pertinent testing, and imaging results were personally reviewed by me. This patient has been appropriately cleared by cardiology with an overall LOW risk of significant perioperative cardiovascular complications.  Based on clinical review performed today (02/08/21), barring any significant acute changes in the  patient's overall condition, it is anticipated that he will be able to proceed with the planned surgical intervention. Any acute changes in clinical condition may necessitate his procedure being postponed and/or cancelled. Pre-surgical instructions were reviewed with the patient during his PAT appointment and questions were fielded by PAT clinical staff.  Brandon Loh, MSN, APRN, FNP-C, CEN [facility]  Peri-operative Services Nurse Practitioner Phone: [phone] 02/08/21 11:52 AM

## 2021-02-12 ENCOUNTER — Encounter: Payer: Self-pay | Admitting: Surgery

## 2021-02-12 ENCOUNTER — Ambulatory Visit
Admission: RE | Admit: 2021-02-12 | Discharge: 2021-02-12 | Disposition: A | Discharge status: Home / Self Care | Payer: 59 | Attending: Surgery | Admitting: Surgery

## 2021-02-12 ENCOUNTER — Ambulatory Visit: Payer: 59

## 2021-02-12 ENCOUNTER — Ambulatory Visit: Payer: 59 | Admitting: Urgent Care

## 2021-02-12 ENCOUNTER — Encounter
Admission: RE | Disposition: A | Discharge status: Home / Self Care | Payer: Self-pay | Source: Home / Self Care | Attending: Surgery

## 2021-02-12 DIAGNOSIS — K5190 Ulcerative colitis, unspecified, without complications: Secondary | ICD-10-CM | POA: Insufficient documentation

## 2021-02-12 DIAGNOSIS — Z79899 Other long term (current) drug therapy: Secondary | ICD-10-CM | POA: Diagnosis not present

## 2021-02-12 DIAGNOSIS — Z880 Allergy status to penicillin: Secondary | ICD-10-CM | POA: Diagnosis not present

## 2021-02-12 DIAGNOSIS — Z419 Encounter for procedure for purposes other than remedying health state, unspecified: Secondary | ICD-10-CM

## 2021-02-12 DIAGNOSIS — M75122 Complete rotator cuff tear or rupture of left shoulder, not specified as traumatic: Secondary | ICD-10-CM | POA: Diagnosis not present

## 2021-02-12 DIAGNOSIS — M19012 Primary osteoarthritis, left shoulder: Secondary | ICD-10-CM | POA: Diagnosis not present

## 2021-02-12 HISTORY — PX: SHOULDER ARTHROSCOPY WITH SUBACROMIAL DECOMPRESSION, ROTATOR CUFF REPAIR AND BICEP TENDON REPAIR: SHX5687

## 2021-02-12 SURGERY — SHOULDER ARTHROSCOPY WITH SUBACROMIAL DECOMPRESSION, ROTATOR CUFF REPAIR AND BICEP TENDON REPAIR
Anesthesia: General | Site: Shoulder | Laterality: Left

## 2021-02-12 MED ORDER — SUCCINYLCHOLINE CHLORIDE 20 MG/ML IJ SOLN
INTRAMUSCULAR | Status: DC | PRN
  Administered 2021-02-12: 140 mg via INTRAVENOUS

## 2021-02-12 MED ORDER — BUPIVACAINE-EPINEPHRINE 0.5% -1:200000 IJ SOLN
INTRAMUSCULAR | Status: DC | PRN
  Administered 2021-02-12: 30 mL

## 2021-02-12 MED ORDER — CHLORHEXIDINE GLUCONATE 0.12 % MT SOLN: 15.0000 mL | Freq: Once | OROMUCOSAL | Status: DC

## 2021-02-12 MED ORDER — ROCURONIUM BROMIDE 10 MG/ML (PF) SYRINGE
PREFILLED_SYRINGE | INTRAVENOUS | Status: AC
  Filled 2021-02-12: qty 10

## 2021-02-12 MED ORDER — PROPOFOL 10 MG/ML IV BOLUS
INTRAVENOUS | Status: DC | PRN
  Administered 2021-02-12: 200 mg via INTRAVENOUS

## 2021-02-12 MED ORDER — EPINEPHRINE PF 1 MG/ML IJ SOLN
INTRAMUSCULAR | Status: AC
  Filled 2021-02-12: qty 1

## 2021-02-12 MED ORDER — EPHEDRINE SULFATE 50 MG/ML IJ SOLN
INTRAMUSCULAR | Status: DC | PRN
  Administered 2021-02-12 (×2): 10 mg via INTRAVENOUS
  Administered 2021-02-12: 5 mg via INTRAVENOUS

## 2021-02-12 MED ORDER — BUPIVACAINE LIPOSOME 1.3 % IJ SUSP
INTRAMUSCULAR | Status: DC | PRN
  Administered 2021-02-12: 13 mL via PERINEURAL
  Administered 2021-02-12: 7 mL via PERINEURAL

## 2021-02-12 MED ORDER — LACTATED RINGERS IV SOLN
INTRAVENOUS | Status: DC | PRN
  Administered 2021-02-12: 3000 mL

## 2021-02-12 MED ORDER — BUPIVACAINE HCL (PF) 0.5 % IJ SOLN
INTRAMUSCULAR | Status: DC | PRN
  Administered 2021-02-12: 3 mL via PERINEURAL
  Administered 2021-02-12: 7 mL via PERINEURAL

## 2021-02-12 MED ORDER — SODIUM CHLORIDE 0.9 % IV SOLN
INTRAVENOUS | Status: DC | PRN
  Administered 2021-02-12: 25 ug/min via INTRAVENOUS

## 2021-02-12 MED ORDER — EPHEDRINE 5 MG/ML INJ
INTRAVENOUS | Status: AC
  Filled 2021-02-12: qty 10

## 2021-02-12 MED ORDER — MIDAZOLAM HCL 2 MG/2ML IJ SOLN
INTRAMUSCULAR | Status: AC
  Administered 2021-02-12: 1 mg via INTRAVENOUS
  Filled 2021-02-12: qty 2

## 2021-02-12 MED ORDER — FENTANYL CITRATE (PF) 100 MCG/2ML IJ SOLN
INTRAMUSCULAR | Status: AC
  Administered 2021-02-12: 50 ug via INTRAVENOUS
  Filled 2021-02-12: qty 2

## 2021-02-12 MED ORDER — LIDOCAINE HCL (CARDIAC) PF 100 MG/5ML IV SOSY
PREFILLED_SYRINGE | INTRAVENOUS | Status: DC | PRN
  Administered 2021-02-12: 100 mg via INTRAVENOUS

## 2021-02-12 MED ORDER — OXYCODONE HCL 5 MG PO TABS
5.0000 mg | ORAL_TABLET | Freq: Once | ORAL | Status: AC | PRN
Start: 2021-02-12 — End: 2021-02-12
  Administered 2021-02-12: 5 mg via ORAL

## 2021-02-12 MED ORDER — MIDAZOLAM HCL 2 MG/2ML IJ SOLN
INTRAMUSCULAR | Status: AC
  Filled 2021-02-12: qty 2

## 2021-02-12 MED ORDER — DEXMEDETOMIDINE (PRECEDEX) IN NS 20 MCG/5ML (4 MCG/ML) IV SYRINGE
PREFILLED_SYRINGE | INTRAVENOUS | Status: DC | PRN
  Administered 2021-02-12: 8 ug via INTRAVENOUS

## 2021-02-12 MED ORDER — GLYCOPYRROLATE 0.2 MG/ML IJ SOLN
INTRAMUSCULAR | Status: DC | PRN
  Administered 2021-02-12: .2 mg via INTRAVENOUS

## 2021-02-12 MED ORDER — LACTATED RINGERS IV SOLN: INTRAVENOUS | Status: DC | PRN

## 2021-02-12 MED ORDER — FENTANYL CITRATE (PF) 100 MCG/2ML IJ SOLN
INTRAMUSCULAR | Status: DC | PRN
  Administered 2021-02-12: 50 ug via INTRAVENOUS

## 2021-02-12 MED ORDER — OXYCODONE HCL 5 MG PO TABS: 5.0000 mg | ORAL_TABLET | ORAL | 0 refills | Status: DC | PRN

## 2021-02-12 MED ORDER — BUPIVACAINE HCL (PF) 0.5 % IJ SOLN
INTRAMUSCULAR | Status: AC
  Filled 2021-02-12: qty 10

## 2021-02-12 MED ORDER — ORAL CARE MOUTH RINSE: 15.0000 mL | Freq: Once | OROMUCOSAL | Status: DC

## 2021-02-12 MED ORDER — PHENYLEPHRINE HCL (PRESSORS) 10 MG/ML IV SOLN
INTRAVENOUS | Status: DC | PRN
  Administered 2021-02-12: 40 ug via INTRAVENOUS

## 2021-02-12 MED ORDER — LIDOCAINE HCL (PF) 1 % IJ SOLN
INTRAMUSCULAR | Status: AC
  Filled 2021-02-12: qty 5

## 2021-02-12 MED ORDER — ACETAMINOPHEN 10 MG/ML IV SOLN
INTRAVENOUS | Status: AC
  Filled 2021-02-12: qty 100

## 2021-02-12 MED ORDER — PROPOFOL 10 MG/ML IV BOLUS
INTRAVENOUS | Status: AC
  Filled 2021-02-12: qty 40

## 2021-02-12 MED ORDER — DEXAMETHASONE SODIUM PHOSPHATE 10 MG/ML IJ SOLN
INTRAMUSCULAR | Status: DC | PRN
  Administered 2021-02-12: 10 mg via INTRAVENOUS

## 2021-02-12 MED ORDER — LACTATED RINGERS IV SOLN: INTRAVENOUS | Status: DC

## 2021-02-12 MED ORDER — ONDANSETRON HCL 4 MG/2ML IJ SOLN
INTRAMUSCULAR | Status: DC | PRN
  Administered 2021-02-12: 4 mg via INTRAVENOUS

## 2021-02-12 MED ORDER — OXYCODONE HCL 5 MG/5ML PO SOLN: 5.0000 mg | Freq: Once | ORAL | Status: AC | PRN

## 2021-02-12 MED ORDER — ROCURONIUM BROMIDE 100 MG/10ML IV SOLN
INTRAVENOUS | Status: DC | PRN
  Administered 2021-02-12: 20 mg via INTRAVENOUS
  Administered 2021-02-12: 50 mg via INTRAVENOUS
  Administered 2021-02-12: 20 mg via INTRAVENOUS
  Administered 2021-02-12 (×3): 10 mg via INTRAVENOUS

## 2021-02-12 MED ORDER — FENTANYL CITRATE (PF) 100 MCG/2ML IJ SOLN
INTRAMUSCULAR | Status: AC
  Filled 2021-02-12: qty 2

## 2021-02-12 MED ORDER — LIDOCAINE HCL (PF) 2 % IJ SOLN
INTRAMUSCULAR | Status: AC
  Filled 2021-02-12: qty 5

## 2021-02-12 MED ORDER — SUGAMMADEX SODIUM 200 MG/2ML IV SOLN
INTRAVENOUS | Status: DC | PRN
  Administered 2021-02-12: 300 mg via INTRAVENOUS

## 2021-02-12 MED ORDER — FENTANYL CITRATE (PF) 100 MCG/2ML IJ SOLN: 50.0000 ug | Freq: Once | INTRAMUSCULAR | Status: AC

## 2021-02-12 MED ORDER — ACETAMINOPHEN 10 MG/ML IV SOLN
INTRAVENOUS | Status: DC | PRN
  Administered 2021-02-12: 1000 mg via INTRAVENOUS

## 2021-02-12 MED ORDER — KETOROLAC TROMETHAMINE 30 MG/ML IJ SOLN
INTRAMUSCULAR | Status: AC
  Filled 2021-02-12: qty 1

## 2021-02-12 MED ORDER — FENTANYL CITRATE (PF) 100 MCG/2ML IJ SOLN
INTRAMUSCULAR | Status: AC
  Administered 2021-02-12: 25 ug via INTRAVENOUS
  Filled 2021-02-12: qty 2

## 2021-02-12 MED ORDER — BUPIVACAINE LIPOSOME 1.3 % IJ SUSP
INTRAMUSCULAR | Status: AC
  Filled 2021-02-12: qty 20

## 2021-02-12 MED ORDER — MIDAZOLAM HCL 2 MG/2ML IJ SOLN: 1.0000 mg | Freq: Once | INTRAMUSCULAR | Status: AC

## 2021-02-12 MED ORDER — SUGAMMADEX SODIUM 500 MG/5ML IV SOLN
INTRAVENOUS | Status: AC
  Filled 2021-02-12: qty 5

## 2021-02-12 MED ORDER — SUCCINYLCHOLINE CHLORIDE 200 MG/10ML IV SOSY
PREFILLED_SYRINGE | INTRAVENOUS | Status: AC
  Filled 2021-02-12: qty 10

## 2021-02-12 MED ORDER — KETAMINE HCL 50 MG/5ML IJ SOSY
PREFILLED_SYRINGE | INTRAMUSCULAR | Status: AC
  Filled 2021-02-12: qty 5

## 2021-02-12 MED ORDER — DEXTROSE 5 % IV SOLN
3.0000 g | INTRAVENOUS | Status: AC
  Administered 2021-02-12: 3 g via INTRAVENOUS
  Filled 2021-02-12: qty 3000

## 2021-02-12 MED ORDER — FENTANYL CITRATE (PF) 100 MCG/2ML IJ SOLN
25.0000 ug | INTRAMUSCULAR | Status: DC | PRN
  Administered 2021-02-12: 25 ug via INTRAVENOUS

## 2021-02-12 MED ORDER — OXYCODONE HCL 5 MG PO TABS
ORAL_TABLET | ORAL | Status: AC
  Filled 2021-02-12: qty 1

## 2021-02-12 MED ORDER — KETOROLAC TROMETHAMINE 30 MG/ML IJ SOLN
30.0000 mg | Freq: Once | INTRAMUSCULAR | Status: AC
  Administered 2021-02-12: 30 mg via INTRAVENOUS

## 2021-02-12 SURGICAL SUPPLY — 53 items
ANCH SUT 2 2.9 2 LD TPR NDL (Anchor) ×1 IMPLANT
ANCH SUT KNTLS STRL SHLDR SYS (Anchor) ×2 IMPLANT
ANCH SUT Q-FX 2.8 (Anchor) ×3 IMPLANT
ANCHOR ALL-SUT Q-FIX 2.8 (Anchor) ×6 IMPLANT
ANCHOR JUGGERKNOT WTAP NDL 2.9 (Anchor) ×2 IMPLANT
ANCHOR SUT QUATTRO KNTLS 4.5 (Anchor) ×4 IMPLANT
APL PRP STRL LF DISP 70% ISPRP (MISCELLANEOUS) ×1
BIT DRILL JUGRKNT W/NDL BIT2.9 (DRILL) ×1 IMPLANT
BLADE FULL RADIUS 3.5 (BLADE) ×2 IMPLANT
BUR ACROMIONIZER 4.0 (BURR) ×2 IMPLANT
CANNULA SHAVER 8MMX76MM (CANNULA) ×2 IMPLANT
CHLORAPREP W/TINT 26 (MISCELLANEOUS) ×2 IMPLANT
COVER MAYO STAND REUSABLE (DRAPES) ×2 IMPLANT
COVER WAND RF STERILE (DRAPES) ×2 IMPLANT
DRAPE IMP U-DRAPE 54X76 (DRAPES) ×4 IMPLANT
DRILL JUGGERKNOT W/NDL BIT 2.9 (DRILL) ×2
ELECT CAUTERY BLADE 6.4 (BLADE) ×2 IMPLANT
ELECT REM PT RETURN 9FT ADLT (ELECTROSURGICAL) ×2
ELECTRODE REM PT RTRN 9FT ADLT (ELECTROSURGICAL) ×1 IMPLANT
GAUZE SPONGE 4X4 12PLY STRL (GAUZE/BANDAGES/DRESSINGS) ×2 IMPLANT
GAUZE XEROFORM 1X8 LF (GAUZE/BANDAGES/DRESSINGS) ×2 IMPLANT
GLOVE SRG 8 PF TXTR STRL LF DI (GLOVE) ×1 IMPLANT
GLOVE SURG ENC MOIS LTX SZ7.5 (GLOVE) ×4 IMPLANT
GLOVE SURG ENC MOIS LTX SZ8 (GLOVE) ×4 IMPLANT
GLOVE SURG UNDER LTX SZ8 (GLOVE) ×2 IMPLANT
GLOVE SURG UNDER POLY LF SZ8 (GLOVE) ×2
GOWN STRL REUS W/ TWL LRG LVL3 (GOWN DISPOSABLE) ×1 IMPLANT
GOWN STRL REUS W/ TWL XL LVL3 (GOWN DISPOSABLE) ×1 IMPLANT
GOWN STRL REUS W/TWL LRG LVL3 (GOWN DISPOSABLE) ×2
GOWN STRL REUS W/TWL XL LVL3 (GOWN DISPOSABLE) ×2
GRASPER SUT 15 45D LOW PRO (SUTURE) IMPLANT
IV LACTATED RINGER IRRG 3000ML (IV SOLUTION) ×4
IV LR IRRIG 3000ML ARTHROMATIC (IV SOLUTION) ×2 IMPLANT
KIT CANNULA 8X76-LX IN CANNULA (CANNULA) IMPLANT
KIT SUTURE 2.8 Q-FIX DISP (MISCELLANEOUS) ×2 IMPLANT
MANIFOLD NEPTUNE II (INSTRUMENTS) ×4 IMPLANT
MASK FACE SPIDER DISP (MASK) ×2 IMPLANT
MAT ABSORB  FLUID 56X50 GRAY (MISCELLANEOUS) ×1
MAT ABSORB FLUID 56X50 GRAY (MISCELLANEOUS) ×1 IMPLANT
PACK ARTHROSCOPY SHOULDER (MISCELLANEOUS) ×2 IMPLANT
PASSER SUT FIRSTPASS SELF (INSTRUMENTS) ×4 IMPLANT
SLING ARM LRG DEEP (SOFTGOODS) ×2 IMPLANT
SLING ULTRA II LG (MISCELLANEOUS) ×2 IMPLANT
STAPLER SKIN PROX 35W (STAPLE) ×2 IMPLANT
STRAP SAFETY 5IN WIDE (MISCELLANEOUS) ×2 IMPLANT
SUT ETHIBOND 0 MO6 C/R (SUTURE) ×2 IMPLANT
SUT ULTRABRAID 2 COBRAID 38 (SUTURE) ×12 IMPLANT
SUT VIC AB 2-0 CT1 27 (SUTURE) ×4
SUT VIC AB 2-0 CT1 TAPERPNT 27 (SUTURE) ×2 IMPLANT
TAPE MICROFOAM 4IN (TAPE) ×2 IMPLANT
TISSUE ARTHOFLEX THICK 3MM (Tissue) ×2 IMPLANT
TUBING ARTHRO INFLOW-ONLY STRL (TUBING) ×2 IMPLANT
WAND WEREWOLF FLOW 90D (MISCELLANEOUS) ×2 IMPLANT

## 2021-02-12 NOTE — Anesthesia Procedure Notes (Signed)
Anesthesia Regional Block: Interscalene brachial plexus block   Pre-Anesthetic Checklist: ,, timeout performed, Correct Patient, Correct Site, Correct Laterality, Correct Procedure, Correct Position, site marked, Risks and benefits discussed,  Surgical consent,  Pre-op evaluation,  At surgeon's request and post-op pain management  Laterality: Upper and Left  Prep: chloraprep       Needles:  Injection technique: Single-shot  Needle Type: Stimiplex     Needle Length: 5cm  Needle Gauge: 22     Additional Needles:   Procedures:,,,, ultrasound used (permanent image in chart),,,,  Narrative:  Start time: 02/12/2021 9:52 AM End time: 02/12/2021 9:56 AM Injection made incrementally with aspirations every 5 mL.  Performed by: Personally  Anesthesiologist: Braleigh Massoud, Precious Haws, MD  Additional Notes: Patient consented for risk and benefits of nerve block including but not limited to nerve damage, failed block, bleeding and infection.  Patient voiced understanding.  Functioning IV was confirmed and monitors were applied.  Timeout done prior to procedure and prior to any sedation being given to the patient.  Patient confirmed procedure site prior to any sedation given to the patient.  A 19m 22ga Stimuplex needle was used. Sterile prep,hand hygiene and sterile gloves were used.  Minimal sedation used for procedure.  No paresthesia endorsed by patient during the procedure.  Negative aspiration and negative test dose prior to incremental administration of local anesthetic. The patient tolerated the procedure well with no immediate complications.

## 2021-02-12 NOTE — Anesthesia Postprocedure Evaluation (Signed)
Anesthesia Post Note  Patient: Brandon [name]  Procedure(s) Performed: LEFT SHOULDER ARTHROSCOPY WITH DEBRIDEMENT, DECOMPRESSION, ROTATOR CUFF REPAIR, BICEPS TENODESIS, AND DISTAL CLAVICLE EXCISION (Left Shoulder)  Anesthesia Type: General Anesthetic complications: no   No complications documented.   Last Vitals:  Vitals:   02/12/21 0952 02/12/21 1350  BP: 134/82 118/67  Pulse: 66 77  Resp: 15 15  Temp:  (!) 36.3 C  SpO2: 98% 96%    Last Pain:  Vitals:   02/12/21 1350  TempSrc:   PainSc: Asleep                 [address]

## 2021-02-12 NOTE — Anesthesia Procedure Notes (Signed)
Procedure Name: Intubation Date/Time: 02/12/2021 10:47 AM Performed by: Lily Peer, Joanthan Hlavacek, CRNA Pre-anesthesia Checklist: Patient identified, Emergency Drugs available, Suction available and Patient being monitored Patient Re-evaluated:Patient Re-evaluated prior to induction Oxygen Delivery Method: Circle system utilized Preoxygenation: Pre-oxygenation with 100% oxygen Induction Type: IV induction Ventilation: Oral airway inserted - appropriate to patient size Grade View: Grade I Tube type: Oral Tube size: 7.5 mm Number of attempts: 1 Airway Equipment and Method: Stylet and Oral airway Placement Confirmation: ETT inserted through vocal cords under direct vision,  positive ETCO2 and breath sounds checked- equal and bilateral Secured at: 23 cm Tube secured with: Tape Dental Injury: Teeth and Oropharynx as per pre-operative assessment

## 2021-02-12 NOTE — Discharge Instructions (Addendum)
Orthopedic discharge instructions: Keep dressing dry and intact.  May shower after dressing changed on post-op day #4 (Saturday).  Cover staples with Band-Aids after drying off. Apply ice frequently to shoulder. Take ibuprofen 600-800 mg TID with meals for 7-10 days, then as necessary. Take oxycodone as prescribed when needed.  May supplement with ES Tylenol if necessary. Keep shoulder immobilizer on at all times except may remove for bathing purposes. Follow-up in 10-14 days or as scheduled.   AMBULATORY SURGERY  DISCHARGE INSTRUCTIONS   1) The drugs that you were given will stay in your system until tomorrow so for the next 24 hours you should not:  A) Drive an automobile B) Make any legal decisions C) Drink any alcoholic beverage   2) You may resume regular meals tomorrow.  Today it is better to start with liquids and gradually work up to solid foods.  You may eat anything you prefer, but it is better to start with liquids, then soup and crackers, and gradually work up to solid foods.   3) Please notify your doctor immediately if you have any unusual bleeding, trouble breathing, redness and pain at the surgery site, drainage, fever, or pain not relieved by medication.    4) Additional Instructions:    Please contact your physician with any problems or Same Day Surgery at [phone], Monday through Friday 6 am to 4 pm, or [place] at [facility] number at [phone].      Interscalene Nerve Block with Exparel  1.  For your surgery you have received an Interscalene Nerve Block with Exparel. 2. Nerve Blocks affect many types of nerves, including nerves that control movement, pain and normal sensation.  You may experience feelings such as numbness, tingling, heaviness, weakness or the inability to move your arm or the feeling or sensation that your arm has "fallen asleep". 3. A nerve block with Exparel can last up to 5 days.  Usually the weakness wears off first.   The tingling and heaviness usually wear off next.  Finally you may start to notice pain.  Keep in mind that this may occur in any order.  Once a nerve block starts to wear off it is usually completely gone within 60 minutes. 4. ISNB may cause mild shortness of breath, a hoarse voice, blurry vision, unequal pupils, or drooping of the face on the same side as the nerve block.  These symptoms will usually resolve with the numbness.  Very rarely the procedure itself can cause mild seizures. 5. If needed, your surgeon will give you a prescription for pain medication.  It will take about 60 minutes for the oral pain medication to become fully effective.  So, it is recommended that you start taking this medication before the nerve block first begins to wear off, or when you first begin to feel discomfort. 6. Take your pain medication only as prescribed.  Pain medication can cause sedation and decrease your breathing if you take more than you need for the level of pain that you have. 7. Nausea is a common side effect of many pain medications.  You may want to eat something before taking your pain medicine to prevent nausea. 8. After an Interscalene nerve block, you cannot feel pain, pressure or extremes in temperature in the effected arm.  Because your arm is numb it is at an increased risk for injury.  To decrease the possibility of injury, please practice the following:  a. While you are awake change the position of  your arm frequently to prevent too much pressure on any one area for prolonged periods of time. b.  If you have a cast or tight dressing, check the color or your fingers every couple of hours.  Call your surgeon with the appearance of any discoloration (white or blue). c. If you are given a sling to wear before you go home, please wear it  at all times until the block has completely worn off.  Do not get up at night without your sling. d. Please contact [facility] or your surgeon if you do not  begin to regain sensation after 7 days from the surgery.  Anesthesia may be contacted by calling the Same Day Surgery Department, Mon. through Fri., 6 am to 4 pm at [phone].   e. If you experience any other problems or concerns, please contact your surgeon's office. f. If you experience severe or prolonged shortness of breath go to the nearest emergency department.

## 2021-02-12 NOTE — H&P (Signed)
History of Present Illness: Brandon [name] is a 55 y.o. who presents today for history and physical. He is to undergo a left shoulder subacromial decompression with repair of rotator cuff on 02/12/2021. The patient was last seen in clinic on 12/24/2020. There have been no change in his condition since that time.  The patient has been evaluated the past and diagnosed with a small full-thickness supraspinatus tear of the left shoulder. He was scheduled for a left shoulder arthroscopy with debridement, decompression, excision of distal clavicle, rotator cuff repair and biceps tenodesis. The patient was not able to undergo surgery due to a flare of his ulcerative colitis which did require several prednisone tapers and additional medications. He states that his ulcerative colitis is under better control now, he does have a follow-up with gastroenterology in the next 1-2 weeks. He states that over the past 3 weeks without any injury or trauma he began to experience significant pain in his left shoulder. Pain is located along the anterior lateral aspect of the shoulder and does radiate into the elbow. He denies any falls, he does have significant discomfort with sleeping on the left side. He reports that with range of motion the pain can reach severe, he does rated as a 10 out of 10 in severity. He denies any catching or locking symptoms at today's visit. He is taking Tylenol without significant relief. He presents today to discuss possible left subacromial steroid injection.  Past Medical History: . A-fib (CMS-HCC)  . Arthropathy, unspecified, site unspecified 06/07/2009  . Atrial fibrillation (CMS-HCC) 06/07/2009  . Basal cell carcinoma 06/25/2011  . BMI 40.0-44.9, adult (CMS-HCC) 10/12/2018  . Carpal tunnel syndrome, right 03/14/2019  . Cervical disc disorder with radiculopathy of cervical region 10/05/2013  . Cervical radiculitis 06/05/2020  . Cervicalgia 06/05/2020  . 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 Colon polyp  05/15/14 (tubular adenoma)  . 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 Essential hypertension 06/16/2013  . 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 Gastroesophageal reflux disease 03/07/2020  . History of migraine headaches  . Hypertension  . Nontraumatic complete tear of left rotator cuff 11/15/2018  . Olecranon bursitis 01/02/2021  . Rotator cuff tendinitis, left 12/05/2019  . Status post right rotator cuff repair 05/23/2019  . Tendinitis of upper biceps tendon of left shoulder 11/15/2018  . Ulcerative colitis with complication (CMS-HCC) 04/05/1307  . Ventricular ectopy 12/14/2015   Past Surgical History:  . COLONOSCOPY 05/15/14 (repeat 5 years per [facility])  . COLONOSCOPY 04/20/2018 (Tubular adenoma/ColitisRepeat 72yrM[facility])  . COLONOSCOPY 06/20/2019 (Hyperplastic colon polyp/Sessile serrated polyp//Colitis/Proctitis/Repeat 371yrMUS)  . Extensive arthroscopic debridement, arthroscopic repair of subscapularis tendon tear, subacromial decompression, arthroscopic distal clavicle excision, mini-open rotator cuff repair, and mini-open biceps tenodesis, right shoulder. Right 12/14/2018 (Dr. PoRoland Rack  Past Family History: History reviewed. No pertinent family history.  Medications: . acetaminophen (TYLENOL) 500 MG tablet Take 1,000 mg by mouth as needed for Pain  . ascorbic acid, vitamin C, (VITAMIN C) 500 MG tablet Take 500 mg by mouth 2 (two) times daily  . balsalazide (COLAZAL) 750 mg capsule Take 3 capsules (2,250 mg total) by mouth 3 (three) times daily 270 capsule 11  . BD LUER-LOK SYRINGE 3 mL 22 gauge x 1" Syrg USE TO INJECT B 12 INJECTION 4 each 2  . cholecalciferol (VITAMIN D3) 400 unit tablet Take 2 tablets (800 Units total) by mouth once daily 60 tablet 11  . cyanocobalamin (VITAMIN B12) 1,000 mcg/mL injection Inject 1 mL intramuscularly once  a week for 4 weeks and then once a month 1 mL 10  . diazePAM (VALIUM) 5 MG tablet Take 5 mg by mouth as needed  . diltiazem (CARDIZEM  CD) 180 MG CD capsule Take 1 capsule by mouth once daily  . flecainide (TAMBOCOR) 100 MG tablet Take 100 mg by mouth 2 (two) times daily.  Marland Kitchen HYDROcodone-acetaminophen (NORCO) 5-325 mg tablet Take 1 tablet by mouth every 6 (six) hours as needed for Pain  . losartan (COZAAR) 25 MG tablet Take 25 mg by mouth once daily  . mesalamine (ROWASA) 4 gram/60 mL enema Place 60 mLs (4 g total) rectally nightly Enema to be retained overnight, approximately 8 hours. 21 enema 1  . omeprazole (PRILOSEC) 20 MG DR capsule Take 1 capsule (20 mg total) by mouth once daily 90 capsule 1  . TURMERIC ORAL Take 1,000 mg by mouth 2 (two) times daily  . tamsulosin (FLOMAX) 0.4 mg capsule Take by mouth Take 1 capsule (0.4 mg total) by mouth daily. (Patient not taking: Reported on 02/06/2021 )   Allergies:  . Clarithromycin Unknown (Pt has history of arrhythmias)  . Penicillins Rash   Review of Systems: A comprehensive 14 point ROS was performed, reviewed, and the pertinent orthopaedic findings are documented in the HPI.  Physical Exam: BP (!) 140/80 (BP Location: Left upper arm, Patient Position: Sitting, BP Cuff Size: Large Adult)  Ht 188 cm (6' 2" )  Wt (!) 153.5 kg (338 lb 6.4 oz)  BMI 43.45 kg/m   General: Well-developed well-nourished male seen in no acute distress.   HEENT: Atraumatic,normocephalic. Pupils are equal and reactive to light. Oropharynx is clear with moist mucosa  Lungs: Clear to auscultation bilaterally   Cardiovascular: Regular rate and rhythm. Normal S1, S2. No murmurs. No appreciable gallops or rubs. Peripheral pulses are palpable.  Abdomen: Soft, non-tender, nondistended. Bowel sounds present  Extremity: Leftshoulder exam: SKIN:Normal SWELLING:None WARMTH:None LYMPH NODES:No adenopathy palpable CREPITUS:None TENDERNESS:Significantly tender over the left subacromial space ROM (active):  Forward flexion:90degrees Abduction: 80degrees Internal rotation: Left  SI joint ROM (passive):  Forward flexion: 155degrees Abduction: 150degrees ER/IR at 90 abd: 90 degrees/55degrees  All passive motion with moderate pain to the left shoulder.  He notessevere pain with forward flexion and abduction.  STRENGTH: Forward flexion: 4+/5 Abduction: 4-4+/5 External rotation: 4/5 Internal rotation: 4+/5 Pain with RC testing: Mild pain with resisted abduction more so than with resistedexternal rotation  STABILITY:Normal  SPECIAL TESTS: Luan Pulling' test:Positive Speed's test: Positive Capsulitis - pain w/ passive ER:No Crossed arm test: Negative Lift off test: Positive Crank: Not evaluated Anterior apprehension: Negative Posterior apprehension: Not evaluated  Neurological: The patient is alert and oriented Sensation to light touch appears to be intact and within normal limits Gross motor strength appeared to be equal to 5/5  Vascular : Peripheral pulses felt to be palpable. Capillary refill appears to be intact and within normal limits  X-ray: By report, the MRI scan demonstrates evidence of a bursal-sided partial-thickness tear involving the anterior insertional fibers of the supraspinatus tendon with a small full-thickness component anteriorly. There also is evidence of tendinopathic changes of the intra-articular portion of the long head of the biceps tendon, as well as significant degenerative joint disease of the [facility] joint.   Impression: 1. Nontraumatic complete tear of left rotator cuff 2. Tendinitis left upper bicep left shoulder  Plan: The treatment options, including  both surgical and nonsurgical choices, have been discussed in detail with the patient. The patient would like to proceed  with surgical intervention to include a left shoulder arthroscopy with debridement, decompression, distal clavicle excision, mini-open rotator cuff repair, and biceps tenodesis. The risks (including bleeding, infection, nerve and/or blood vessel injury, persistent or recurrent pain, loosening or failure of the components, leg length inequality, dislocation, need for further surgery, blood clots, strokes, heart attacks or arrhythmias, pneumonia, etc.) and benefits of the surgical procedure were discussed. The patient states his understanding and agrees to proceed. A formal written consent will be obtained by the nursing staff.   H&P reviewed and patient re-examined. No changes.

## 2021-02-12 NOTE — Anesthesia Preprocedure Evaluation (Addendum)
Anesthesia Evaluation  Patient identified by MRN, date of birth, ID band Patient awake    Reviewed: Allergy & Precautions, H&P , NPO status , Patient's Chart, lab work & pertinent test results  History of Anesthesia Complications (+) history of anesthetic complications (sore throat)  Airway Mallampati: III  TM Distance: <3 FB Neck ROM: limited    Dental  (+) Chipped   Pulmonary neg pulmonary ROS, neg shortness of breath,    Pulmonary exam normal        Cardiovascular Exercise Tolerance: Good hypertension, (-) angina(-) Past MI Normal cardiovascular exam     Neuro/Psych  Headaches, PSYCHIATRIC DISORDERS  Neuromuscular disease    GI/Hepatic Neg liver ROS, PUD, GERD  Medicated and Controlled,  Endo/Other  negative endocrine ROS  Renal/GU      Musculoskeletal  (+) Arthritis ,   Abdominal   Peds  Hematology negative hematology ROS (+)   Anesthesia Other Findings Past Medical History: 06/07/2009: ARTHRITIS No date: Arthritis 06/07/2009: ATRIAL FIBRILLATION, PAROXYSMAL 10/05/2013: Cervical disc disorder with radiculopathy of cervical  region 03/03/2012: Classical migraine No date: Colon polyp 06/07/2009: DEPRESSION 06/07/2009: DIVERTICULITIS, COLON No date: GERD (gastroesophageal reflux disease) No date: Hypertension 04/28/2018: Ulcerative colitis ([place])  Past Surgical History: No date: CARDIAC CATHETERIZATION No date: COLONOSCOPY 04/20/2018: COLONOSCOPY WITH PROPOFOL; N/A     Comment:  Procedure: COLONOSCOPY WITH PROPOFOL;  Surgeon:               Lollie Sails, MD;  Location: [facility] ENDOSCOPY;                Service: Endoscopy;  Laterality: N/A; 06/20/2019: COLONOSCOPY WITH PROPOFOL; N/A     Comment:  Procedure: COLONOSCOPY WITH PROPOFOL;  Surgeon:               Lollie Sails, MD;  Location: [facility] ENDOSCOPY;                Service: Endoscopy;  Laterality: N/A; No date: FOOT SURGERY     Comment:  left foot No  date: ROTATOR CUFF REPAIR; Right 12/14/2018: SHOULDER ARTHROSCOPY WITH SUBACROMIAL DECOMPRESSION AND  BICEP TENDON REPAIR; Right     Comment:  Procedure: SHOULDER ARTHROSCOPY WITH EBRIDEMENT,               DECOMPRESSION, DISTAL CLAVICLE EXCISION, BICEP TENODESIS               AND DEBRIDEMENT, AND REPAIR OF ROTATOR CUFF TEAR;                Surgeon: Corky Mull, MD;  Location: [facility] ORS;                Service: Orthopedics;  Laterality: Right; No date: TONSILLECTOMY  BMI    Body Mass Index: 43.27 kg/m      Reproductive/Obstetrics negative OB ROS                             Anesthesia Physical Anesthesia Plan  ASA: III  Anesthesia Plan: General ETT   Post-op Pain Management: GA combined w/ Regional for post-op pain   Induction: Intravenous  PONV Risk Score and Plan: Ondansetron, Dexamethasone, Midazolam and Treatment may vary due to age or medical condition  Airway Management Planned: Oral ETT  Additional Equipment:   Intra-op Plan:   Post-operative Plan: Extubation in OR  Informed Consent: I have reviewed the patients History and Physical, chart, labs and discussed the procedure including the  risks, benefits and alternatives for the proposed anesthesia with the patient or authorized representative who has indicated his/her understanding and acceptance.     Dental Advisory Given  Plan Discussed with: Anesthesiologist, CRNA and Surgeon  Anesthesia Plan Comments: (Patient consented for risks of anesthesia including but not limited to:  - adverse reactions to medications - damage to eyes, teeth, lips or other oral mucosa - nerve damage due to positioning  - sore throat or hoarseness - Damage to heart, brain, nerves, lungs, other parts of body or loss of life  Patient voiced understanding.)        Anesthesia Quick Evaluation

## 2021-02-12 NOTE — Transfer of Care (Signed)
Immediate Anesthesia Transfer of Care Note  Patient: Brandon [name]  Procedure(s) Performed: LEFT SHOULDER ARTHROSCOPY WITH DEBRIDEMENT, DECOMPRESSION, ROTATOR CUFF REPAIR, BICEPS TENODESIS, AND DISTAL CLAVICLE EXCISION (Left Shoulder)  Patient Location: PACU  Anesthesia Type:General  Level of Consciousness: drowsy  Airway & Oxygen Therapy: Patient connected to face mask oxygen  Post-op Assessment: Post -op Vital signs reviewed and stable  Post vital signs: stable  Last Vitals:  Vitals Value Taken Time  BP 118/67 02/12/21 1350  Temp 36.3 C 02/12/21 1350  Pulse 74 02/12/21 1354  Resp 14 02/12/21 1354  SpO2 97 % 02/12/21 1354  Vitals shown include unvalidated device data.  Last Pain:  Vitals:   02/12/21 1350  TempSrc:   PainSc: Asleep         Complications: No complications documented.

## 2021-02-12 NOTE — Op Note (Signed)
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  Post-Op Diagnosis:   Impingement/tendinopathy with large retracted full-thickness rotator cuff tear, degenerative labral fraying, degenerative joint disease of AC joint, and biceps tendinopathy, left shoulder.  Procedure:   Extensive arthroscopic debridement, arthroscopic subacromial decompression, arthroscopic excision of distal clavicle, mini-open rotator cuff repair utilizing a dermal allograft, and mini-open biceps tenodesis, left shoulder.  Anesthesia:   General endotracheal with interscalene block using Exparel placed preoperatively by the anesthesiologist.  Surgeon:   Pascal Lux, MD  Assistant:   Cameron Proud, PA-C; Marijean Bravo, PA-S  Findings:   As above. There was mild-moderate labral fraying anteriorly and superiorly with significant synovitis. The biceps tendon demonstrated moderate synovitis/"lip sticking" without partial or full-thickness tearing. There was a full-thickness tear involving the superior insertional fibers of the subscapularis tendon, as well as the entire supraspinatus tendon and extending into the anterior fibers of the infraspinatus tendon. The articular surfaces of the glenoid and humerus both were in satisfactory condition.  Complications:   None  Fluids:   900 cc  Estimated blood loss:   25 cc  Tourniquet time:   None  Drains:   None  Closure:   Staples      Brief clinical note:   The patient is a 55 year old male with a history of gradually worsening left shoulder pain and weakness. The patient's symptoms have progressed despite medications, activity modification, etc. The patient's history and examination are consistent with impingement/tendinopathy with a rotator cuff tear. These findings were confirmed by MRI scan. The MRI  scan also demonstrated biceps tendinitis as well as degenerative joint disease of the AC joint. The patient presents at this time for definitive management of these shoulder symptoms.  Procedure:   The patient underwent placement of an interscalene block using Exparel by the anesthesiologist in the preoperative holding area before being brought into the operating room and lain in the supine position. The patient then underwent general endotracheal intubation and anesthesia before being repositioned in the beach chair position using the beach chair positioner. The left shoulder and upper extremity were prepped with ChloraPrep solution before being draped sterilely. Preoperative antibiotics were administered. A timeout was performed to confirm the proper surgical site before the expected portal sites and incision site were injected with 0.5% Sensorcaine with epinephrine.   A posterior portal was created and the glenohumeral joint thoroughly inspected with the findings as described above. An anterior portal was created using an outside-in technique. The labrum and rotator cuff were further probed, again confirming the above-noted findings. The areas of labral fraying were debrided back to stable margins using the full-radius resector, as were several areas of synovitis. The torn margins of the rotator cuff also were debrided back to stable margins using the full-radius resector. The ArthroCare wand was inserted and used to release the biceps tendon from its labral anchor. It also was used to obtain hemostasis as well as to "anneal" the labrum superiorly and anteriorly. The instruments were removed from the joint after suctioning the excess fluid.  The camera was repositioned through the posterior portal into the subacromial space. A separate lateral portal was created using an outside-in technique. The 3.5 mm full-radius resector was introduced and used to perform a subtotal bursectomy. The ArthroCare wand was  then inserted and used to remove the periosteal tissue off the undersurface  of the anterior third of the acromion as well as to recess the coracoacromial ligament from its attachment along the anterior and lateral margins of the acromion. The 4.0 mm acromionizing bur was introduced and used to complete the decompression by removing the undersurface of the anterior third of the acromion. The full radius resector was reintroduced to remove any residual bony debris before the ArthroCare wand was reintroduced to obtain hemostasis.   The camera was repositioned in the lateral portal and the ArthroCare wand introduced through the anterior portal. The soft tissues were debrided off the undersurface of the distal clavicle sufficiently to expose the distal centimeter of clavicle. The 4 mm acromionizing bur was introduced through the anterior portal. Under arthroscopic visualization, the distal 8 to 10 mm of the distal clavicle was removed. The full-radius resector was reintroduced to remove any residual bony debris before the ArthroCare wand was reintroduced to obtain hemostasis. The adequacy of distal clavicle excision was confirmed arthroscopically from both the anterior and lateral portals. The instruments were then removed from the subacromial space after suctioning the excess fluid.  An approximately 6-7 cm incision was made over the anterolateral aspect of the shoulder beginning at the anterolateral corner of the acromion and extending distally in line with the bicipital groove. This incision was carried down through the subcutaneous tissues to expose the deltoid fascia. The raphae between the anterior and middle thirds was identified and this plane developed to provide access into the subacromial space. Additional bursal tissues were debrided sharply using Metzenbaum scissors. The rotator cuff tear was readily identified. The margins were debrided sharply with a #15 blade and the exposed greater tuberosity  roughened with a rongeur. An attempt was made to mobilize the tear by placing several tacking sutures then releasing adhesions superior and inferior to the rotator cuff. Despite extensive efforts at mobilization, the tendon could not be brought back all the way to the greater tuberosity.   Therefore, it was elected to proceed with augmenting the repair with a 4 mm dermal allograft. Numerous #2 FiberWire sutures were placed through the remaining rotator cuff tendon. These were then passed through the dermal allograft utilizing the [place] device after contouring the graft to the appropriate size. These sutures were tied securely. Laterally, the graft was repaired to the greater tuberosity using three [employer] 2.8 mm Q-Fix anchors with care taken to optimize the tension on the graft. These sutures were then brought back laterally and secured using two [place] QuatroLink anchors to create a two-layer closure. An apparent watertight closure was obtained.  The bicipital groove was identified by palpation and opened for 1-1.5 cm. The biceps tendon stump was retrieved through this defect. The floor of the bicipital groove was roughened with a curet before a single Biomet 2.9 mm JuggerKnot anchor was inserted. Both sets of sutures were passed through the biceps tendon and tied securely to effect the tenodesis. The bicipital sheath was reapproximated using two #0 Ethibond interrupted sutures, incorporating the biceps tendon to further reinforce the tenodesis.  The wound was copiously irrigated with sterile saline solution before the deltoid raphae was reapproximated using 2-0 Vicryl interrupted sutures. The subcutaneous tissues were closed in two layers using 2-0 Vicryl interrupted sutures before the skin was closed using staples. The portal sites also were closed using staples. A sterile bulky dressing was applied to the shoulder before the arm was placed into a shoulder immobilizer. The patient  was then awakened, extubated, and returned to the  recovery room in satisfactory condition after tolerating the procedure well.

## 2021-02-13 ENCOUNTER — Encounter: Payer: Self-pay | Admitting: Surgery

## 2021-06-01 ENCOUNTER — Other Ambulatory Visit: Payer: Self-pay | Admitting: Cardiovascular Disease

## 2021-06-30 ENCOUNTER — Other Ambulatory Visit: Payer: Self-pay | Admitting: Cardiovascular Disease

## 2021-07-11 ENCOUNTER — Other Ambulatory Visit: Payer: Self-pay | Admitting: Cardiovascular Disease

## 2021-07-11 NOTE — Telephone Encounter (Signed)
Please schedule 6 month F/U for 90 day refill. Thank you!

## 2021-08-05 ENCOUNTER — Other Ambulatory Visit: Payer: Self-pay | Admitting: Family Medicine

## 2021-08-05 ENCOUNTER — Other Ambulatory Visit: Payer: Self-pay | Admitting: Cardiovascular Disease

## 2021-08-05 NOTE — Telephone Encounter (Signed)
Last filled 12-20-20 #30 Last OV Acute 12-24-20, CPE 08-27-20 No Future OV [facility]

## 2021-08-07 ENCOUNTER — Encounter: Payer: Self-pay | Admitting: Cardiovascular Disease

## 2021-08-07 ENCOUNTER — Other Ambulatory Visit: Payer: Self-pay

## 2021-08-07 ENCOUNTER — Ambulatory Visit (INDEPENDENT_AMBULATORY_CARE_PROVIDER_SITE_OTHER): Payer: 59 | Admitting: Cardiovascular Disease

## 2021-08-07 VITALS — BP 134/82 | Ht 74.0 in | Wt 336.0 lb

## 2021-08-07 DIAGNOSIS — I480 Paroxysmal atrial fibrillation: Secondary | ICD-10-CM

## 2021-08-07 DIAGNOSIS — Z79899 Other long term (current) drug therapy: Secondary | ICD-10-CM

## 2021-08-07 DIAGNOSIS — I493 Ventricular premature depolarization: Secondary | ICD-10-CM

## 2021-08-07 DIAGNOSIS — I10 Essential (primary) hypertension: Secondary | ICD-10-CM

## 2021-08-07 DIAGNOSIS — I2510 Atherosclerotic heart disease of native coronary artery without angina pectoris: Secondary | ICD-10-CM | POA: Diagnosis not present

## 2021-08-07 DIAGNOSIS — Z13220 Encounter for screening for lipoid disorders: Secondary | ICD-10-CM

## 2021-08-07 MED ORDER — LOSARTAN POTASSIUM 25 MG PO TABS: 25.0000 mg | ORAL_TABLET | Freq: Every day | ORAL | 3 refills | Status: DC

## 2021-08-07 MED ORDER — DILTIAZEM HCL ER COATED BEADS 180 MG PO CP24: ORAL_CAPSULE | ORAL | 3 refills | Status: DC

## 2021-08-07 MED ORDER — FLECAINIDE ACETATE 100 MG PO TABS: ORAL_TABLET | ORAL | 3 refills | Status: DC

## 2021-08-07 NOTE — Patient Instructions (Addendum)
Medication Instructions:  No changes  If you need a refill on your cardiac medications before your next appointment, please call your pharmacy.   Lab work: Lipids today  Testing/Procedures: No new testing needed  Follow-Up: At [name], you and your health needs are our priority.  As part of our continuing mission to provide you with exceptional heart care, we have created designated Provider Care Teams.  These Care Teams include your primary Cardiologist (physician) and Advanced Practice Providers (APPs -  Physician Assistants and Nurse Practitioners) who all work together to provide you with the care you need, when you need it.  You will need a follow up appointment in 12 months  Providers on your designated Care Team:   Murray Hodgkins, NP Christell Faith, PA-C Marrianne Mood, PA-C [place], [place]  COVID-19 Vaccine Information can be found at: [email or web address] For questions related to vaccine distribution or appointments, please email [email or web address] or call [phone].

## 2021-08-07 NOTE — Progress Notes (Signed)
Cardiology Office Note  Date:  08/07/2021   ID:  Brandon [name], DOB [birth year], MRN [id number]  PCP:  Owens Loffler, MD   CC: atrial fib f/u  HPI:  Brandon [name] is a 55 -year-old gentleman with history of  hypertension,  obesity,  chronic back pain, after traumatic injury, fall atrial fibrillation , previously declined anticoagulation Atrial fibrillation likely dating back to before 2012, appreciated fluttering, does not remember circumstances Remote history of PVCs Cardiac catheterization may 2012 showed no significant CAD, normal ejection fraction greater than 55%. who presents for routine followup of his atrial fibrillation, hypertension, morbid obesity.  Last seen in clinic by myself July 2021 Prior history of diarrhea, ulcerative colitis, followed by GI  Weight continues to run high, BMI 43  Left shoulder surgery 02/2021 Long recovery, feels he will get 75% of his mobility and strength back  Coughing today, inhaling corn dust on the farm Reports he used an N95 mask Scheduled to see ENT today  Denies any tachypalpitations concerning for atrial fibrillation Not on anticoagulation secondary to Chads VASC 1 Tolerating diltiazem and flecainide  No regular exercise program  Chronic cervical and lower back pain  Back pain may stem from prior fall off a combine onto metal disc striking mid and lower lumbar spine in October 2017 Chronic pain since that time  He is a non-smoker, no diabetes, cholesterol at goal Blood pressure stable  EKG personally reviewed by myself on todays visit shows normal sinus rhythm rate 81 bpm no significant ST or T-wave changes   Other past medical history reviewed Works on a farm with heavy machinery    several years since his last episode of atrial fibrillation.   Cardiac catheterization may 2012 showed no significant CAD, normal ejection fraction greater than 55%. Cholesterol well controlled without any cluster medication  PMH:    has a past medical history of ARTHRITIS (06/07/2009), Arthritis, ATRIAL FIBRILLATION, PAROXYSMAL (06/07/2009), Cervical disc disorder with radiculopathy of cervical region (10/05/2013), Classical migraine (03/03/2012), Colon polyp, DEPRESSION (06/07/2009), DIVERTICULITIS, COLON (06/07/2009), GERD (gastroesophageal reflux disease), Hypertension, and Ulcerative colitis ([place]) (04/28/2018).  PSH:    Past Surgical History:  Procedure Laterality Date   CARDIAC CATHETERIZATION     COLONOSCOPY     COLONOSCOPY WITH PROPOFOL N/A 04/20/2018   Procedure: COLONOSCOPY WITH PROPOFOL;  Surgeon: Lollie Sails, MD;  Location: [facility] ENDOSCOPY;  Service: Endoscopy;  Laterality: N/A;   COLONOSCOPY WITH PROPOFOL N/A 06/20/2019   Procedure: COLONOSCOPY WITH PROPOFOL;  Surgeon: Lollie Sails, MD;  Location: [facility] ENDOSCOPY;  Service: Endoscopy;  Laterality: N/A;   FOOT SURGERY     left foot   ROTATOR CUFF REPAIR Right    SHOULDER ARTHROSCOPY WITH SUBACROMIAL DECOMPRESSION AND BICEP TENDON REPAIR Right 12/14/2018   Procedure: SHOULDER ARTHROSCOPY WITH EBRIDEMENT, DECOMPRESSION, DISTAL CLAVICLE EXCISION, BICEP TENODESIS AND DEBRIDEMENT, AND REPAIR OF ROTATOR CUFF TEAR;  Surgeon: Corky Mull, MD;  Location: [facility] ORS;  Service: Orthopedics;  Laterality: Right;   SHOULDER ARTHROSCOPY WITH SUBACROMIAL DECOMPRESSION, ROTATOR CUFF REPAIR AND BICEP TENDON REPAIR Left 02/12/2021   Procedure: LEFT SHOULDER ARTHROSCOPY WITH DEBRIDEMENT, DECOMPRESSION, ROTATOR CUFF REPAIR, BICEPS TENODESIS, AND DISTAL CLAVICLE EXCISION;  Surgeon: Corky Mull, MD;  Location: [facility] ORS;  Service: Orthopedics;  Laterality: Left;   TONSILLECTOMY      Current Outpatient Medications  Medication Sig Dispense Refill   acetaminophen (TYLENOL) 500 MG tablet Take 1,000 mg by mouth every 6 (six) hours as needed for moderate pain.  balsalazide (COLAZAL) 750 MG capsule Take 2,250 mg by mouth 3 (three) times daily.     cyanocobalamin (,VITAMIN B-12,) 1000  MCG/ML injection Inject 1,000 mcg into the muscle every 30 (thirty) days.     diazepam (VALIUM) 5 MG tablet TAKE 1 TABLET BY MOUTH EVERY 8 HOURS AS NEEDED FOR ANXIETY. 30 tablet 0   diltiazem (CARDIZEM CD) 180 MG 24 hr capsule TAKE 1 CAPSULE BY MOUTH EVERY DAY 90 capsule 3   flecainide (TAMBOCOR) 100 MG tablet TAKE 1 TABLET BY MOUTH 2 (TWO) TIMES DAILY. PLEASE KEEP APPOINTMENT SCHEDULED IN OCTOBER 60 tablet 0   losartan (COZAAR) 25 MG tablet Take 1 tablet (25 mg total) by mouth daily. PLEASE CALL OFFICE TO SCHEDULE FOLLOW UP APPOINTMENT 90 tablet 0   mesalamine (ROWASA) 4 g enema Place 4 g rectally daily as needed (colitis flare).     omeprazole (PRILOSEC) 20 MG capsule Take 20 mg by mouth daily.     SYRINGE-NEEDLE, DISP, 3 ML (B-D 3CC LUER-LOK SYR 22GX1") 22G X 1" 3 ML MISC USE TO INJECT B 12 INJECTION     TURMERIC PO Take 1,000 mg by mouth 2 (two) times daily.     vitamin C (ASCORBIC ACID) 500 MG tablet Take 500 mg by mouth 2 (two) times daily.     Vitamin D, Cholecalciferol, 10 MCG (400 UNIT) CAPS Take 400 Units by mouth 2 (two) times daily.     oxyCODONE (ROXICODONE) 5 MG immediate release tablet Take 1-2 tablets (5-10 mg total) by mouth every 4 (four) hours as needed for moderate pain or severe pain. (Patient not taking: Reported on 08/07/2021) 40 tablet 0   No current facility-administered medications for this visit.    Allergies:   Biaxin [clarithromycin] and Penicillins   Social History:  The patient  reports that he has never smoked. His smokeless tobacco use includes snuff. He reports that he does not currently use alcohol. He reports that he does not use drugs.   Family History:   family history includes Arthritis in an other family member; Diabetes in an other family member; Stroke in his father and another family member.    Review of Systems: Review of Systems  Constitutional: Negative.   Respiratory: Negative.    Cardiovascular: Negative.   Gastrointestinal: Negative.   Negative for nausea.  Musculoskeletal:  Positive for back pain.       Neck pain, low back pain  Neurological: Negative.   Psychiatric/Behavioral: Negative.    All other systems reviewed and are negative.  PHYSICAL EXAM: VS:  BP 134/82 (BP Location: Left Arm, Patient Position: Sitting, Cuff Size: Large)   Ht 6' 2"  (1.88 m)   Wt (!) 336 lb (152.4 kg)   SpO2 96%   BMI 43.14 kg/m  , BMI Body mass index is 43.14 kg/m.  Constitutional:  oriented to person, place, and time. No distress.  HENT:  Head: Grossly normal Eyes:  no discharge. No scleral icterus.  Neck: No JVD, no carotid bruits  Cardiovascular: Regular rate and rhythm, no murmurs appreciated Pulmonary/Chest: Clear to auscultation bilaterally, no wheezes or rails Abdominal: Soft.  no distension.  no tenderness.  Musculoskeletal: Normal range of motion Neurological:  normal muscle tone. Coordination normal. No atrophy Skin: Skin warm and dry Psychiatric: normal affect, pleasant   Recent Labs: 02/08/2021: BUN 13; Creatinine, Ser 0.69; Hemoglobin 13.9; Magnesium 2.0; Platelets 279; Potassium 3.7; Sodium 138    Lipid Panel Lab Results  Component Value Date   CHOL 131 08/02/2020  HDL 27.60 (L) 08/02/2020   LDLCALC 81 08/02/2020   TRIG 113.0 08/02/2020      Wt Readings from Last 3 Encounters:  08/07/21 (!) 336 lb (152.4 kg)  02/12/21 (!) 337 lb (152.9 kg)  02/07/21 (!) 337 lb (152.9 kg)     ASSESSMENT AND PLAN:  Atrial fibrillation, Paroxysmal - Plan: EKG 12-Lead CHADSVASC ,score of 1 Denies any symptoms of tachypalpitations concerning for arrhythmia Continue diltiazem and flecainide.   Feels comfortable staying on his current medications, previously declined anticoagulation  Ulcerative colitis Previously reported bleeding diarrhea Reports symptoms are stable, comes and goes  Essential hypertension - Plan: EKG 12-Lead Blood pressure is well controlled on today's visit. No changes made to the  medications.  Morbid obesity with BMI of 40.0-44.9, adult ([place]) Works on the farm, tries to stay active  We have encouraged continued exercise, careful diet management in an effort to lose weight. Limited after recent left shoulder surgery  Ventricular ectopy Asymptomatic from PVCs  Coughing Secondary to dust inhalation from working on the farm, and is combine with the corn dust Needs continued use of mask, N95   Total encounter time more than 25 minutes  Greater than 50% was spent in counseling and coordination of care with the patient    No orders of the defined types were placed in this encounter.    Signed, Esmond Plants, M.D., Ph.D. 08/07/2021  [place], [place]

## 2021-08-08 LAB — LIPID PANEL
Chol/HDL Ratio: 6.5 ratio — ABNORMAL HIGH (ref 0.0–5.0)
Cholesterol, Total: 149 mg/dL (ref 100–199)
HDL: 23 mg/dL — ABNORMAL LOW (ref >39)
LDL Chol Calc (NIH): 90 mg/dL (ref 0–99)
Triglycerides: 206 mg/dL — ABNORMAL HIGH (ref 0–149)
VLDL Cholesterol Cal: 36 mg/dL (ref 5–40)

## 2021-11-11 DIAGNOSIS — M7522 Bicipital tendinitis, left shoulder: Secondary | ICD-10-CM | POA: Diagnosis not present

## 2021-11-11 DIAGNOSIS — M19012 Primary osteoarthritis, left shoulder: Secondary | ICD-10-CM | POA: Diagnosis not present

## 2021-11-11 DIAGNOSIS — M7582 Other shoulder lesions, left shoulder: Secondary | ICD-10-CM | POA: Diagnosis not present

## 2021-11-11 DIAGNOSIS — M75122 Complete rotator cuff tear or rupture of left shoulder, not specified as traumatic: Secondary | ICD-10-CM | POA: Diagnosis not present

## 2021-12-04 DIAGNOSIS — H5203 Hypermetropia, bilateral: Secondary | ICD-10-CM | POA: Diagnosis not present

## 2021-12-04 DIAGNOSIS — H52223 Regular astigmatism, bilateral: Secondary | ICD-10-CM | POA: Diagnosis not present

## 2021-12-04 DIAGNOSIS — H524 Presbyopia: Secondary | ICD-10-CM | POA: Diagnosis not present

## 2021-12-04 DIAGNOSIS — H2513 Age-related nuclear cataract, bilateral: Secondary | ICD-10-CM | POA: Diagnosis not present

## 2022-01-27 DIAGNOSIS — E559 Vitamin D deficiency, unspecified: Secondary | ICD-10-CM | POA: Diagnosis not present

## 2022-01-27 DIAGNOSIS — E538 Deficiency of other specified B group vitamins: Secondary | ICD-10-CM | POA: Diagnosis not present

## 2022-01-27 DIAGNOSIS — K5130 Ulcerative (chronic) rectosigmoiditis without complications: Secondary | ICD-10-CM | POA: Diagnosis not present

## 2022-01-27 DIAGNOSIS — Z86010 Personal history of colonic polyps: Secondary | ICD-10-CM | POA: Diagnosis not present

## 2022-02-20 ENCOUNTER — Encounter: Payer: Self-pay | Admitting: Family

## 2022-02-20 ENCOUNTER — Ambulatory Visit (INDEPENDENT_AMBULATORY_CARE_PROVIDER_SITE_OTHER): Payer: 59 | Admitting: Family

## 2022-02-20 VITALS — BP 122/72 | HR 70 | Temp 98.2°F | Resp 16 | Ht 74.0 in | Wt 325.4 lb

## 2022-02-20 DIAGNOSIS — R051 Acute cough: Secondary | ICD-10-CM | POA: Insufficient documentation

## 2022-02-20 DIAGNOSIS — J40 Bronchitis, not specified as acute or chronic: Secondary | ICD-10-CM | POA: Diagnosis not present

## 2022-02-20 MED ORDER — PREDNISONE 20 MG PO TABS: ORAL_TABLET | ORAL | 0 refills | Status: DC

## 2022-02-20 MED ORDER — BENZONATATE 200 MG PO CAPS: 200.0000 mg | ORAL_CAPSULE | Freq: Two times a day (BID) | ORAL | 0 refills | Status: DC | PRN

## 2022-02-20 MED ORDER — FLUTICASONE PROPIONATE 50 MCG/ACT NA SUSP: 2.0000 | Freq: Every day | NASAL | 6 refills | Status: DC

## 2022-02-20 MED ORDER — DOXYCYCLINE HYCLATE 100 MG PO TABS: 100.0000 mg | ORAL_TABLET | Freq: Two times a day (BID) | ORAL | 0 refills | Status: AC

## 2022-02-20 NOTE — Assessment & Plan Note (Signed)
rx doxycyline however advised pt give 1-2 days if no improvement then to start doxycycline 100 mg ?Not given zpack due to risk of QT with afib ?rx prednisone 20 mg taper  ?Antibiotic sent to preferred pharmacy.  ? ?Take antibiotic as prescribed. Increase oral fluids. Pt to f/u if sx worsen and or fail to improve in 2-3 days. ? ?

## 2022-02-20 NOTE — Patient Instructions (Addendum)
Recommend daily flonase to start for allergies.  ?Antibiotic sent to preferred pharmacy.  ?Sending in steroid to help with your cough as well as cough capsule  ? ?Please increase oral fluids, steamy hot shower/humidifier prn. ? ?Please follow up if no improvement in 2-3 days.  ? ?It was a pleasure seeing you today! Please do not hesitate to reach out with any questions and or concerns. ? ?Regards,  ? ?Coree Brame ? ?

## 2022-02-20 NOTE — Progress Notes (Signed)
? ?Established Patient Office Visit ? ?Subjective:  ?Patient ID: Brandon [name], male    DOB: [birth year]  Age: 56 y.o. MRN: [id number] ? ?CC:  ?Chief Complaint  ?Patient presents with  ? Cough  ? Wheezing  ?  X 4 days  ? ? ?HPI ?Brandon [name] is here today with concerns.  ? ?With some chest congestion, cough that is dry non productive, and can hear himself wheezing when he coughs only. He also hears it some when he is walking around. Denies sob.  ? ?No sore throat, no ear pain, no nasal congestion.  ?No fever or chills.  ?Sx for the last six days.  ? ?Nothing taken otc.  ? ?Past Medical History:  ?Diagnosis Date  ? ARTHRITIS 06/07/2009  ? Arthritis   ? ATRIAL FIBRILLATION, PAROXYSMAL 06/07/2009  ? Cervical disc disorder with radiculopathy of cervical region 10/05/2013  ? Classical migraine 03/03/2012  ? Colon polyp   ? DEPRESSION 06/07/2009  ? DIVERTICULITIS, COLON 06/07/2009  ? GERD (gastroesophageal reflux disease)   ? Hypertension   ? Ulcerative colitis ([place]) 04/28/2018  ? ? ?Past Surgical History:  ?Procedure Laterality Date  ? CARDIAC CATHETERIZATION    ? COLONOSCOPY    ? COLONOSCOPY WITH PROPOFOL N/A 04/20/2018  ? Procedure: COLONOSCOPY WITH PROPOFOL;  Surgeon: Lollie Sails, MD;  Location: [facility] ENDOSCOPY;  Service: Endoscopy;  Laterality: N/A;  ? COLONOSCOPY WITH PROPOFOL N/A 06/20/2019  ? Procedure: COLONOSCOPY WITH PROPOFOL;  Surgeon: Lollie Sails, MD;  Location: [facility] ENDOSCOPY;  Service: Endoscopy;  Laterality: N/A;  ? FOOT SURGERY    ? left foot  ? ROTATOR CUFF REPAIR Right   ? SHOULDER ARTHROSCOPY WITH SUBACROMIAL DECOMPRESSION AND BICEP TENDON REPAIR Right 12/14/2018  ? Procedure: SHOULDER ARTHROSCOPY WITH EBRIDEMENT, DECOMPRESSION, DISTAL CLAVICLE EXCISION, BICEP TENODESIS AND DEBRIDEMENT, AND REPAIR OF ROTATOR CUFF TEAR;  Surgeon: Corky Mull, MD;  Location: [facility] ORS;  Service: Orthopedics;  Laterality: Right;  ? SHOULDER ARTHROSCOPY WITH SUBACROMIAL DECOMPRESSION, ROTATOR CUFF REPAIR AND BICEP  TENDON REPAIR Left 02/12/2021  ? Procedure: LEFT SHOULDER ARTHROSCOPY WITH DEBRIDEMENT, DECOMPRESSION, ROTATOR CUFF REPAIR, BICEPS TENODESIS, AND DISTAL CLAVICLE EXCISION;  Surgeon: Corky Mull, MD;  Location: [facility] ORS;  Service: Orthopedics;  Laterality: Left;  ? TONSILLECTOMY    ? ? ?Family History  ?Problem Relation Age of Onset  ? Stroke Father   ? Arthritis Other   ? Diabetes Other   ?     1st degree relative  ? Stroke Other   ?     F 1st degree relative <60; M 1st degree relative <50  ? ? ?Social History  ? ?Socioeconomic History  ? Marital status: Single  ?  Spouse name: Not on file  ? Number of children: Not on file  ? Years of education: Not on file  ? Highest education level: Not on file  ?Occupational History  ? Occupation: self employed  ?  Employer: [employer]  ?Tobacco Use  ? Smoking status: Never  ? Smokeless tobacco: Current  ?  Types: Snuff  ? Tobacco comments:  ?  occasionally  ?Vaping Use  ? Vaping Use: Never used  ?Substance and Sexual Activity  ? Alcohol use: Not Currently  ?  Alcohol/week: 0.0 standard drinks  ? Drug use: Never  ? Sexual activity: Not on file  ?Other Topics Concern  ? Not on file  ?Social History Narrative  ? Does not do regular exercise.   ? ?Social Determinants of Health  ? ?  Financial Resource Strain: Not on file  ?Food Insecurity: Not on file  ?Transportation Needs: Not on file  ?Physical Activity: Not on file  ?Stress: Not on file  ?Social Connections: Not on file  ?Intimate Partner Violence: Not on file  ? ? ?Outpatient Medications Prior to Visit  ?Medication Sig Dispense Refill  ? acetaminophen (TYLENOL) 500 MG tablet Take 1,000 mg by mouth every 6 (six) hours as needed for moderate pain.     ? cyanocobalamin (,VITAMIN B-12,) 1000 MCG/ML injection Inject 1,000 mcg into the muscle every 30 (thirty) days.    ? diazepam (VALIUM) 5 MG tablet TAKE 1 TABLET BY MOUTH EVERY 8 HOURS AS NEEDED FOR ANXIETY. 30 tablet 0  ? diltiazem (CARDIZEM CD) 180 MG 24 hr capsule TAKE 1 CAPSULE  BY MOUTH EVERY DAY 90 capsule 3  ? flecainide (TAMBOCOR) 100 MG tablet TAKE 1 TABLET BY MOUTH 2 (TWO) TIMES DAILY. PLEASE KEEP APPOINTMENT SCHEDULED IN OCTOBER 180 tablet 3  ? losartan (COZAAR) 25 MG tablet Take 1 tablet (25 mg total) by mouth daily. 90 tablet 3  ? mesalamine (LIALDA) 1.2 g EC tablet Take by mouth.    ? mesalamine (ROWASA) 4 g enema Place 4 g rectally daily as needed (colitis flare).    ? omeprazole (PRILOSEC) 20 MG capsule Take 20 mg by mouth daily.    ? SYRINGE-NEEDLE, DISP, 3 ML (B-D 3CC LUER-LOK SYR 22GX1") 22G X 1" 3 ML MISC USE TO INJECT B 12 INJECTION    ? TURMERIC PO Take 1,000 mg by mouth 2 (two) times daily.    ? vitamin C (ASCORBIC ACID) 500 MG tablet Take 500 mg by mouth 2 (two) times daily.    ? Vitamin D, Cholecalciferol, 10 MCG (400 UNIT) CAPS Take 400 Units by mouth 2 (two) times daily.    ? balsalazide (COLAZAL) 750 MG capsule Take 2,250 mg by mouth 3 (three) times daily.    ? oxyCODONE (ROXICODONE) 5 MG immediate release tablet Take 1-2 tablets (5-10 mg total) by mouth every 4 (four) hours as needed for moderate pain or severe pain. 40 tablet 0  ? ?No facility-administered medications prior to visit.  ? ? ?Allergies  ?Allergen Reactions  ? Biaxin [Clarithromycin] Other (See Comments)  ?  Pt has history of arrhythmias  ? Penicillins Rash  ?  Did it involve swelling of the face/tongue/throat, SOB, or low BP? Unknown ?Did it involve sudden or severe rash/hives, skin peeling, or any reaction on the inside of your mouth or nose? Unknown ?Did you need to seek medical attention at a hospital or doctor's office? Unknown ?When did it last happen?      childhood allergy ?If all above answers are "NO", may proceed with cephalosporin use. ?  ? ? ?ROS ?Review of Systems  ?Constitutional:  Positive for fatigue. Negative for chills and fever.  ?HENT:  Negative for congestion, ear pain, sinus pain and sore throat.   ?Respiratory:  Positive for cough and wheezing. Negative for shortness of breath.    ?Cardiovascular:  Negative for chest pain and palpitations.  ? ?  ?Objective:  ?  ?Physical Exam ?Constitutional:   ?   General: He is awake. He is not in acute distress. ?   Appearance: Normal appearance. He is obese. He is not ill-appearing, toxic-appearing or diaphoretic.  ?HENT:  ?   Head: Normocephalic.  ?   Right Ear: A middle ear effusion (clear) is present.  ?   Left Ear: Tympanic membrane normal.  ?  Nose: Nose normal.  ?   Right Turbinates: Not enlarged or swollen.  ?   Left Turbinates: Not enlarged or swollen.  ?   Right Sinus: No maxillary sinus tenderness or frontal sinus tenderness.  ?   Left Sinus: No maxillary sinus tenderness or frontal sinus tenderness.  ?   Mouth/Throat:  ?   Mouth: Mucous membranes are moist.  ?   Pharynx: No pharyngeal swelling, oropharyngeal exudate or posterior oropharyngeal erythema.  ?Eyes:  ?   Extraocular Movements: Extraocular movements intact.  ?   Pupils: Pupils are equal, round, and reactive to light.  ?Cardiovascular:  ?   Rate and Rhythm: Normal rate and regular rhythm.  ?Pulmonary:  ?   Effort: Pulmonary effort is normal.  ?   Breath sounds: Normal breath sounds. No wheezing.  ?Neurological:  ?   Mental Status: He is alert.  ? ? ?BP 122/72   Pulse 70   Temp 98.2 ?F (36.8 ?C)   Resp 16   Ht 6' 2"  (1.88 m)   Wt (!) 325 lb 7 oz (147.6 kg)   SpO2 97%   BMI 41.78 kg/m?  ?Wt Readings from Last 3 Encounters:  ?02/20/22 (!) 325 lb 7 oz (147.6 kg)  ?08/07/21 (!) 336 lb (152.4 kg)  ?02/12/21 (!) 337 lb (152.9 kg)  ? ? ? ?Health Maintenance Due  ?Topic Date Due  ? Zoster Vaccines- Shingrix (1 of 2) Never done  ? COVID-19 Vaccine (5 - Booster) 04/18/2020  ? TETANUS/TDAP  12/04/2020  ? ? ?There are no preventive care reminders to display for this patient. ? ?Lab Results  ?Component Value Date  ? TSH 3.55 08/02/2020  ? ?Lab Results  ?Component Value Date  ? WBC 8.2 02/08/2021  ? HGB 13.9 02/08/2021  ? HCT 42.7 02/08/2021  ? MCV 82.6 02/08/2021  ? PLT 279 02/08/2021   ? ?Lab Results  ?Component Value Date  ? NA 138 02/08/2021  ? K 3.7 02/08/2021  ? CO2 24 02/08/2021  ? GLUCOSE 94 02/08/2021  ? BUN 13 02/08/2021  ? CREATININE 0.69 02/08/2021  ? BILITOT 0.4 08/02/2020  ? ALKP

## 2022-02-20 NOTE — Assessment & Plan Note (Signed)
Tessalon perrles  ?rx prednisone  ?

## 2022-03-11 DIAGNOSIS — K5130 Ulcerative (chronic) rectosigmoiditis without complications: Secondary | ICD-10-CM | POA: Diagnosis not present

## 2022-03-12 DIAGNOSIS — K5130 Ulcerative (chronic) rectosigmoiditis without complications: Secondary | ICD-10-CM | POA: Diagnosis not present

## 2022-03-17 ENCOUNTER — Encounter: Payer: Self-pay | Admitting: Gastroenterology

## 2022-03-17 ENCOUNTER — Telehealth: Payer: Self-pay | Admitting: Gastroenterology

## 2022-03-17 NOTE — Telephone Encounter (Signed)
Good Morning Dr. Havery Moros, ? ? ?Patient called requesting to get an appointment with you. Stated he had heard very good things about you and was wanting you to become his provider. Patient was last seen by [facility] in March of 2023. Patient stated he is not happy with his care there and feels like he is not getting the care he needs. Patient is needing to be seen for Ulcerative Colitis and C-  Diff A and B. Patients records from [facility] are in [place]. Will you please review and advise on scheduling? ? ? ?Thank you.  ?

## 2022-03-17 NOTE — Telephone Encounter (Signed)
Yes, can see me in the office for a routine office visit / consultation. Thanks ?

## 2022-03-18 NOTE — Telephone Encounter (Signed)
Patient was scheduled for 6/9 at 8:30 ?

## 2022-04-03 ENCOUNTER — Other Ambulatory Visit: Payer: Self-pay

## 2022-04-03 NOTE — Telephone Encounter (Signed)
Called and spoke to patient; he is still seeing [place] and is scheduled for colonoscopy in July.  He claims he is still very interested in seeing Dr. Havery Moros since he is unhappy with the care he is getting there and will cancel his procedure with them once he sees [place] and is confident in establishing care.

## 2022-04-11 ENCOUNTER — Encounter: Payer: Self-pay | Admitting: Gastroenterology

## 2022-04-11 ENCOUNTER — Ambulatory Visit: Payer: 59 | Admitting: Gastroenterology

## 2022-04-11 VITALS — BP 140/76 | HR 68 | Ht 74.0 in | Wt 314.0 lb

## 2022-04-11 DIAGNOSIS — K51318 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: Secondary | ICD-10-CM

## 2022-04-11 DIAGNOSIS — A0472 Enterocolitis due to Clostridium difficile, not specified as recurrent: Secondary | ICD-10-CM | POA: Diagnosis not present

## 2022-04-11 MED ORDER — PREDNISONE 5 MG PO TABS: ORAL_TABLET | ORAL | 0 refills | Status: DC

## 2022-04-11 MED ORDER — MESALAMINE 4 G RE ENEM: 4.0000 g | ENEMA | Freq: Every day | RECTAL | 2 refills | Status: DC

## 2022-04-11 MED ORDER — NA SULFATE-K SULFATE-MG SULF 17.5-3.13-1.6 GM/177ML PO SOLN: 1.0000 | ORAL | 0 refills | Status: DC

## 2022-04-11 MED ORDER — MESALAMINE 1.2 G PO TBEC: 4.8000 g | DELAYED_RELEASE_TABLET | Freq: Every day | ORAL | 3 refills | Status: DC

## 2022-04-11 NOTE — Patient Instructions (Addendum)
We have sent the following medications to your pharmacy for you to pick up at your convenience: Rowasa every night  Continue Lialda 4 tablets daily.  Dr Havery Moros has advised that you be on a prednisone taper. The taper instructions are as follows: Prednisone 20 mg daily x 7 days Prednisone 15 mg daily x 7 days Prednisone 10 mg daily x 7 days Prednisone 5 mg daily x 7 days Then, discontinue.    You have been scheduled for a colonoscopy. Please follow written instructions given to you at your visit today.  Please pick up your prep supplies at the pharmacy within the next 1-3 days. If you use inhalers (even only as needed), please bring them with you on the day of your procedure.  If you are age 58 or older, your body mass index should be between 23-30. Your Body mass index is 40.32 kg/m. If this is out of the aforementioned range listed, please consider follow up with your Primary Care Provider.  If you are age 71 or younger, your body mass index should be between 19-25. Your Body mass index is 40.32 kg/m. If this is out of the aformentioned range listed, please consider follow up with your Primary Care Provider.   ________________________________________________________  The [place] providers would like to encourage you to use [facility] to communicate with providers for non-urgent requests or questions.  Due to long hold times on the telephone, sending your provider a message by [facility] may be a faster and more efficient way to get a response.  Please allow 48 business hours for a response.  Please remember that this is for non-urgent requests.  _______________________________________________________  Due to recent changes in healthcare laws, you may see the results of your imaging and laboratory studies on MyChart before your provider has had a chance to review them.  We understand that in some cases there may be results that are confusing or concerning to you. Not all laboratory  results come back in the same time frame and the provider may be waiting for multiple results in order to interpret others.  Please give [place] 48 hours in order for your provider to thoroughly review all the results before contacting the office for clarification of your results.

## 2022-04-11 NOTE — Progress Notes (Signed)
HPI :  56 year old male with a history of ulcerative colitis, atrial fibrillation (not on anticoagulation), diverticulitis, history of colon polyps, GERD, here for new patient visit to establish for ulcerative colitis.  History provided by the patient and I reviewed records from his previous GI practice. 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  Over the past 4 years she has been on a variety of regimens including sulfasalazine, Asacol, balsalazide, Lialda, and Rowasa.  He has not had any hospitalizations for his colitis over time but he has had numerous flares that have bothered him.  Most recently in recent months he was switched from balsalazide to Lialda 4 tabs daily, and taking Rowasa every other day.  He states he had antibiotics for sinus infection in recent months.  He had a flare of his symptoms earlier this month, with tested positive for C. difficile on May 10 and was treated with antibiotics.  He states that helped, was also given a prednisone taper, starting at 40 mg/week and tapering down by 10 mg/week until done.  He is currently taking 20 mg of prednisone (week 3).  He thinks he has had multiple flares of his colitis over the past few years.  He has had fecal calprotectin last October of 253.  More recently his fecal calprotectin increased to 627 on May 10.  Looking back in 2021 his fecal calprotectin was 352.  His last colonoscopy was in August 2020 and he had active left-sided colitis with a fair prep.  He is becoming frustrated with the lack of good control and wanted to seek an additional evaluation to discuss other options.  He has never had a biologic therapy.  He is only been on mesalamine based regimens.  He seems to tolerate the enemas okay.  He is currently feeling much better after having steroids and being treated for C. difficile.  He denies any family history of colitis or Crohn's.  No NSAID use.  He is using Tylenol as needed.  He does have a  history of A-fib, currently not on any blood thinners.  He states this is well controlled with flecainide.  He is due to see his cardiologist in the near future for follow-up.     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   Foraminal stenosis of cervical region    GERD (gastroesophageal reflux disease)    Hypertension    Ulcerative colitis ([place]) 04/28/2018   Ventricular ectopy      Past Surgical History:  Procedure Laterality Date   CARDIAC CATHETERIZATION     COLONOSCOPY  05/2014   COLONOSCOPY WITH PROPOFOL N/A 04/20/2018   Procedure: COLONOSCOPY WITH PROPOFOL;  Surgeon: Lollie Sails, MD;  Location: [facility] ENDOSCOPY;  Service: Endoscopy;  Laterality: N/A;   COLONOSCOPY WITH PROPOFOL N/A 06/20/2019   Procedure: COLONOSCOPY WITH PROPOFOL;  Surgeon: Lollie Sails, MD;  Location: [facility] ENDOSCOPY;  Service: Endoscopy;  Laterality: N/A;   FOOT SURGERY     left foot   ROTATOR CUFF REPAIR Right    SHOULDER ARTHROSCOPY WITH SUBACROMIAL DECOMPRESSION AND BICEP TENDON REPAIR Right 12/14/2018  Procedure: SHOULDER ARTHROSCOPY WITH EBRIDEMENT, DECOMPRESSION, DISTAL CLAVICLE EXCISION, BICEP TENODESIS AND DEBRIDEMENT, AND REPAIR OF ROTATOR CUFF TEAR;  Surgeon: Corky Mull, MD;  Location: [facility] ORS;  Service: Orthopedics;  Laterality: Right;   SHOULDER ARTHROSCOPY WITH SUBACROMIAL DECOMPRESSION, ROTATOR CUFF REPAIR AND BICEP TENDON REPAIR Left 02/12/2021   Procedure: LEFT SHOULDER ARTHROSCOPY WITH DEBRIDEMENT, DECOMPRESSION, ROTATOR CUFF REPAIR, BICEPS TENODESIS, AND DISTAL CLAVICLE EXCISION;  Surgeon: Corky Mull, MD;  Location: [facility] ORS;  Service: Orthopedics;  Laterality: Left;   TONSILLECTOMY     Family History  Problem Relation Age of Onset   Colon polyps Mother    Stroke Father    Arthritis Other    Diabetes Other        1st degree relative   Stroke Other        F 1st degree relative <60; M 1st degree relative <50   Social History   Tobacco Use   Smoking status: Never   Smokeless tobacco: Former    Types: Snuff   Tobacco comments:    occasionally  Vaping Use   Vaping Use: Never used  Substance Use Topics   Alcohol use: Not Currently    Alcohol/week: 0.0 standard drinks of alcohol   Drug use: Never   Current Outpatient Medications  Medication Sig Dispense Refill   acetaminophen (TYLENOL) 500 MG tablet Take 1,000 mg by mouth every 6 (six) hours as needed for moderate pain.      cyanocobalamin (,VITAMIN B-12,) 1000 MCG/ML injection Inject 1,000 mcg into the muscle every 30 (thirty) days.     diazepam (VALIUM) 5 MG tablet TAKE 1 TABLET BY MOUTH EVERY 8 HOURS AS NEEDED FOR ANXIETY. 30 tablet 0   diltiazem (CARDIZEM CD) 180 MG 24 hr capsule TAKE 1 CAPSULE BY MOUTH EVERY DAY 90 capsule 3   flecainide (TAMBOCOR) 100 MG tablet TAKE 1 TABLET BY MOUTH 2 (TWO) TIMES DAILY. PLEASE KEEP APPOINTMENT SCHEDULED IN OCTOBER 180 tablet 3   losartan (COZAAR) 25 MG tablet Take 1 tablet (25 mg total) by mouth daily. 90 tablet 3   mesalamine (LIALDA) 1.2 g EC tablet Take 4.8 g by mouth daily with breakfast.     mesalamine (ROWASA) 4 g enema Place 4 g rectally daily as needed (colitis flare).     predniSONE (DELTASONE) 20 MG tablet Take two tablets daily for 3 days followed by one tablet daily for 4 days 10 tablet 0   SYRINGE-NEEDLE, DISP, 3 ML (B-D 3CC LUER-LOK SYR 22GX1") 22G X 1" 3 ML MISC USE TO INJECT B 12 INJECTION     vitamin C (ASCORBIC ACID) 500 MG tablet Take 500 mg by mouth 2 (two) times daily.     Vitamin D, Cholecalciferol, 10 MCG (400 UNIT) CAPS Take 400 Units by mouth 2 (two) times  daily.     TURMERIC PO Take 1,000 mg by mouth 2 (two) times daily. (Patient not taking: Reported on 04/11/2022)     No current facility-administered medications for this visit.   Allergies  Allergen Reactions   Biaxin [Clarithromycin] Other (See Comments)    Pt has history of arrhythmias   Penicillins Rash    Did it involve swelling of the face/tongue/throat, SOB, or low BP? Unknown Did it involve sudden or severe rash/hives, skin peeling, or any reaction on the inside of your mouth or nose? Unknown Did you need to seek medical attention at a hospital or doctor's office? Unknown When did it last happen?      childhood  allergy If all above answers are "NO", may proceed with cephalosporin use.      Review of Systems: All systems reviewed and negative except where noted in HPI.    Labs reviewed in Care everywhere and Epic   Physical Exam: BP 140/76   Pulse 68   Ht 6' 2"  (1.88 m)   Wt (!) 314 lb (142.4 kg)   BMI 40.32 kg/m  Constitutional: Pleasant,well-developed, male in no acute distress. HEENT: Normocephalic and atraumatic. Conjunctivae are normal. No scleral icterus. Neck supple.  Cardiovascular: Normal rate, regular rhythm.  Pulmonary/chest: Effort normal and breath sounds normal.  Abdominal: Soft, nondistended, nontender. . There are no masses palpable.  Extremities: no edema Lymphadenopathy: No cervical adenopathy noted. Neurological: Alert and oriented to person place and time. Skin: Skin is warm and dry. No rashes noted. Psychiatric: Normal mood and affect. Behavior is normal.   ASSESSMENT AND PLAN: 56 year old male here to establish care for the following:  Left-sided ulcerative colitis History of C. Difficile  As above, diagnosed with colitis in 2019, has been on sulfasalazine and a variety of mesalamine based regimens which have not appeared to provide good control over the years with multiple flares.  He has had 3 fecal calprotectins in recent years that  have all been abnormal.  His last colonoscopy showed active disease.  I reviewed his ulcerative colitis with him, goals of therapy are for symptomatic control and to reduce his risk for colon cancer.  Based on his history over time I do not think mesalamine is likely to control his disease very well.  He is not using Rowasa routinely, recommend that every night and that may provide some additional benefit.  I recommend we taper his prednisone a bit slower by 5 mg/week until done, and continue Lialda as presently dosed and Rowasa every night.  I agree with doing a colonoscopy for surveillance, would plan on doing this in the next 2 months or so to see how nightly Rowasa helps.  If on colonoscopy he has active disease despite high-dose Lialda and daily Rowasa, he is going to warrant escalation of therapy to a biologic regimen.  We discussed options in general in case that is needed, which I think is likely.  He agrees with the plan.  I discussed risk benefits of colonoscopy and anesthesia and he wants to proceed.  He is going to follow-up with his cardiologist in the near future as well before his colonoscopy.  If he has recurrent diarrhea while tapering prednisone or despite Rowasa daily along with higher dose Lialda, he will contact me in interim.  Plan: - continue Lialda 4.8gm / day - increase Rowasa to q HS dosing - taper prednisone by 30m / week until done - colonoscopy to be scheduled for August - if recurrent flare in the interim he will contact me - I suspect he will likely warrant transition to biologic therapy but will await his course - follow up with cardiology as scheduled  All questions answered, he agreed with the plan.  SJolly Mango MD [facility]  CC: COwens Loffler MD

## 2022-05-12 ENCOUNTER — Ambulatory Visit: Admit: 2022-05-12 | Payer: 59 | Admitting: Gastroenterology

## 2022-05-12 SURGERY — COLONOSCOPY
Anesthesia: General

## 2022-05-23 ENCOUNTER — Other Ambulatory Visit: Payer: Self-pay

## 2022-05-23 ENCOUNTER — Emergency Department: Payer: 59

## 2022-05-23 ENCOUNTER — Emergency Department
Admission: EM | Admit: 2022-05-23 | Discharge: 2022-05-24 | Disposition: A | Discharge status: Home / Self Care | Payer: 59 | Attending: Student in an Organized Health Care Education/Training Program | Admitting: Student in an Organized Health Care Education/Training Program

## 2022-05-23 DIAGNOSIS — E278 Other specified disorders of adrenal gland: Secondary | ICD-10-CM | POA: Diagnosis not present

## 2022-05-23 DIAGNOSIS — Q8909 Congenital malformations of spleen: Secondary | ICD-10-CM | POA: Diagnosis not present

## 2022-05-23 DIAGNOSIS — N132 Hydronephrosis with renal and ureteral calculous obstruction: Secondary | ICD-10-CM | POA: Diagnosis not present

## 2022-05-23 DIAGNOSIS — N200 Calculus of kidney: Secondary | ICD-10-CM | POA: Diagnosis not present

## 2022-05-23 DIAGNOSIS — R109 Unspecified abdominal pain: Secondary | ICD-10-CM | POA: Diagnosis not present

## 2022-05-23 DIAGNOSIS — K5730 Diverticulosis of large intestine without perforation or abscess without bleeding: Secondary | ICD-10-CM | POA: Diagnosis not present

## 2022-05-23 LAB — BASIC METABOLIC PANEL
Anion gap: 8 (ref 5–15)
BUN: 14 mg/dL (ref 6–20)
CO2: 25 mmol/L (ref 22–32)
Calcium: 9.3 mg/dL (ref 8.9–10.3)
Chloride: 106 mmol/L (ref 98–111)
Creatinine, Ser: 1.21 mg/dL (ref 0.61–1.24)
GFR, Estimated: >60 mL/min (ref >60)
Glucose, Bld: 125 mg/dL — ABNORMAL HIGH (ref 70–99)
Potassium: 4.2 mmol/L (ref 3.5–5.1)
Sodium: 139 mmol/L (ref 135–145)

## 2022-05-23 LAB — URINALYSIS, ROUTINE W REFLEX MICROSCOPIC
Bacteria, UA: NONE SEEN
Bilirubin Urine: NEGATIVE
Glucose, UA: NEGATIVE mg/dL
Ketones, ur: NEGATIVE mg/dL
Leukocytes,Ua: NEGATIVE
Nitrite: NEGATIVE
Protein, ur: NEGATIVE mg/dL
Specific Gravity, Urine: 1.021 (ref 1.005–1.030)
pH: 5 (ref 5.0–8.0)

## 2022-05-23 LAB — CBC
HCT: 42.5 % (ref 39.0–52.0)
Hemoglobin: 13.3 g/dL (ref 13.0–17.0)
MCH: 27 pg (ref 26.0–34.0)
MCHC: 31.3 g/dL (ref 30.0–36.0)
MCV: 86.2 fL (ref 80.0–100.0)
Platelets: 280 10*3/uL (ref 150–400)
RBC: 4.93 MIL/uL (ref 4.22–5.81)
RDW: 14.5 % (ref 11.5–15.5)
WBC: 8 10*3/uL (ref 4.0–10.5)
nRBC: 0 % (ref 0.0–0.2)

## 2022-05-23 MED ORDER — SODIUM CHLORIDE 0.9 % IV BOLUS
500.0000 mL | Freq: Once | INTRAVENOUS | Status: AC
  Administered 2022-05-23: 500 mL via INTRAVENOUS

## 2022-05-23 MED ORDER — OXYCODONE HCL 5 MG PO TABS: 5.0000 mg | ORAL_TABLET | Freq: Three times a day (TID) | ORAL | 0 refills | Status: DC | PRN

## 2022-05-23 MED ORDER — OXYCODONE HCL 5 MG PO TABS
5.0000 mg | ORAL_TABLET | ORAL | Status: DC | PRN
  Administered 2022-05-24: 5 mg via ORAL
  Filled 2022-05-23: qty 1

## 2022-05-23 MED ORDER — TAMSULOSIN HCL 0.4 MG PO CAPS: 0.4000 mg | ORAL_CAPSULE | Freq: Every day | ORAL | 0 refills | Status: DC

## 2022-05-23 MED ORDER — HYDROMORPHONE HCL 1 MG/ML IJ SOLN
1.0000 mg | Freq: Once | INTRAMUSCULAR | Status: AC
  Administered 2022-05-23: 1 mg via INTRAMUSCULAR
  Filled 2022-05-23: qty 1

## 2022-05-23 MED ORDER — KETOROLAC TROMETHAMINE 30 MG/ML IJ SOLN
30.0000 mg | Freq: Once | INTRAMUSCULAR | Status: AC
  Administered 2022-05-23: 30 mg via INTRAMUSCULAR
  Filled 2022-05-23: qty 1

## 2022-05-23 MED ORDER — TAMSULOSIN HCL 0.4 MG PO CAPS: 0.4000 mg | ORAL_CAPSULE | Freq: Every day | ORAL | Status: DC

## 2022-05-23 MED ORDER — OXYCODONE-ACETAMINOPHEN 5-325 MG PO TABS
1.0000 | ORAL_TABLET | ORAL | Status: DC | PRN
  Administered 2022-05-23: 1 via ORAL
  Filled 2022-05-23: qty 1

## 2022-05-23 MED ORDER — ONDANSETRON 4 MG PO TBDP: 4.0000 mg | ORAL_TABLET | Freq: Three times a day (TID) | ORAL | 0 refills | Status: DC | PRN

## 2022-05-23 NOTE — ED Triage Notes (Signed)
Pt states sudden onset of right flank pain an hour ago. Pt denies nausea. Pt denies nausea, known fever, states no known hematuria. Pt with history of a fib.

## 2022-05-23 NOTE — Discharge Instructions (Addendum)
IMPRESSION: 1. Obstructive 5 mm right ureterovesicular junction stone. 2. Interval increase in size of a 4.8 cm left adrenal mass, consistent with lipid-rich benign adenoma. No follow-up imaging is recommended. JACR 2017 Aug; 14(8):1038-44, JCAT 2016 Mar-Apr; 40(2):194-200, Urol J 2006 Spring; 3(2):71-4. 3. 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 Surgical consultation is recommended as myelolipomas > 4 cm are at an increased risk for hemorrhage and rupture. Consider biochemical lab evaluation for functional status and pheochromocytoma prior to resection. JACR 2017 Aug; 14(8):1038-44, JCAT 2016 Mar-Apr; 40(2):194-200, Urol J 2006 Spring; 3(2):71-4. 4. Colonic diverticulosis with no acute diverticulitis.

## 2022-05-23 NOTE — ED Provider Notes (Signed)
[facility] Provider Note    Event Date/Time   First MD Initiated Contact with Patient 05/23/22 2202     (approximate)   History   Flank Pain   HPI  Brandon [name] is a 56 y.o. male who presents to the ER for evaluation of flank pain and right groin pain that occurred around 5:00 this afternoon.  Rates pain is moderate to severe.  No history of kidney stones.  Has had some darker urine.  Does have a brother with kidney stones.  Denies any trauma no history of AAA.     Physical Exam   Triage Vital Signs: ED Triage Vitals [05/23/22 1917]  Enc Vitals Group     BP (!) 153/85     Pulse Rate 73     Resp 16     Temp 98.3 F (36.8 C)     Temp Source Oral     SpO2 100 %     Weight (!) 327 lb (148.3 kg)     Height 6' (1.829 m)     Head Circumference      Peak Flow      Pain Score 10     Pain Loc      Pain Edu?      Excl. in [place]?     Most recent vital signs: Vitals:   05/23/22 1917  BP: (!) 153/85  Pulse: 73  Resp: 16  Temp: 98.3 F (36.8 C)  SpO2: 100%     Constitutional: Alert  Eyes: Conjunctivae are normal.  Head: Atraumatic. Nose: No congestion/rhinnorhea. Mouth/Throat: Mucous membranes are moist.   Neck: Painless ROM.  Cardiovascular:   Good peripheral circulation. Respiratory: Normal respiratory effort.  No retractions.  Gastrointestinal: Soft and nontender.  Musculoskeletal:  no deformity Neurologic:  MAE spontaneously. No gross focal neurologic deficits are appreciated.  Skin:  Skin is warm, dry and intact. No rash noted. Psychiatric: Mood and affect are normal. Speech and behavior are normal.    ED Results / Procedures / Treatments   Labs (all labs ordered are listed, but only abnormal results are displayed) Labs Reviewed  URINALYSIS, ROUTINE W REFLEX MICROSCOPIC - Abnormal; Notable for the following components:      Result Value   Color, Urine YELLOW (*)    APPearance HAZY (*)    Hgb urine dipstick MODERATE (*)     All other components within normal limits  BASIC METABOLIC PANEL - Abnormal; Notable for the following components:   Glucose, Bld 125 (*)    All other components within normal limits  CBC     EKG     RADIOLOGY Please see ED Course for my review and interpretation.  I personally reviewed all radiographic images ordered to evaluate for the above acute complaints and reviewed radiology reports and findings.  These findings were personally discussed with the patient.  Please see medical record for radiology report.    PROCEDURES:  Critical Care performed: No  Procedures   MEDICATIONS ORDERED IN ED: Medications  oxyCODONE-acetaminophen (PERCOCET/ROXICET) 5-325 MG per tablet 1 tablet (1 tablet Oral Given 05/23/22 1922)  tamsulosin (FLOMAX) capsule 0.4 mg (has no administration in time range)  oxyCODONE (Oxy IR/ROXICODONE) immediate release tablet 5 mg (has no administration in time range)  ketorolac (TORADOL) 30 MG/ML injection 30 mg (30 mg Intramuscular Given 05/23/22 2257)  HYDROmorphone (DILAUDID) injection 1 mg (1 mg Intramuscular Given 05/23/22 2256)  sodium chloride 0.9 % bolus 500 mL (500 mLs Intravenous New Bag/Given  05/23/22 2257)     IMPRESSION / MDM / ASSESSMENT AND PLAN / ED COURSE  I reviewed the triage vital signs and the nursing notes.                              Differential diagnosis includes, but is not limited to, stone, pyelonephritis, AAA, musculoskeletal strain, colitis  Patient presented to the ER for evaluation of symptoms as described above.  This presenting complaint could reflect a potentially life-threatening illness therefore the patient will be placed on continuous pulse oximetry and telemetry for monitoring.  Laboratory evaluation will be sent to evaluate for the above complaints.  CT imaging ordered out of triage on my review and interpretation does show evidence of right-sided ureterolithiasis no AAA.  Urinalysis with hematuria no sign of  infection.  Patient given IM Dilaudid as well as IV Toradol and IV fluids with significant improvement in his pain.  Went from 10 out of 10 to 2 out of 10.  This point does appear stable and appropriate for outpatient follow-up.  Will be given referral to urology.  We discussed strict return precautions.    FINAL CLINICAL IMPRESSION(S) / ED DIAGNOSES   Final diagnoses:  Kidney stone     Rx / DC Orders   ED Discharge Orders          Ordered    oxyCODONE (ROXICODONE) 5 MG immediate release tablet  Every 8 hours PRN        05/23/22 2335    tamsulosin (FLOMAX) 0.4 MG CAPS capsule  Daily after supper        05/23/22 2335    ondansetron (ZOFRAN-ODT) 4 MG disintegrating tablet  Every 8 hours PRN        05/23/22 2335             Note:  This document was prepared using Dragon voice recognition software and may include unintentional dictation errors.    Merlyn Lot, MD 05/23/22 2337

## 2022-05-24 ENCOUNTER — Telehealth: Payer: Self-pay | Admitting: Emergency Medicine

## 2022-05-24 MED ORDER — TAMSULOSIN HCL 0.4 MG PO CAPS: 0.4000 mg | ORAL_CAPSULE | Freq: Every day | ORAL | 0 refills | Status: DC

## 2022-05-24 MED ORDER — OXYCODONE HCL 5 MG PO TABS: 5.0000 mg | ORAL_TABLET | Freq: Three times a day (TID) | ORAL | 0 refills | Status: DC | PRN

## 2022-05-24 MED ORDER — TAMSULOSIN HCL 0.4 MG PO CAPS
0.4000 mg | ORAL_CAPSULE | Freq: Once | ORAL | Status: AC
  Administered 2022-05-24: 0.4 mg via ORAL
  Filled 2022-05-24 (×2): qty 1

## 2022-05-24 MED ORDER — ONDANSETRON 8 MG PO TBDP: 8.0000 mg | ORAL_TABLET | Freq: Three times a day (TID) | ORAL | 0 refills | Status: DC | PRN

## 2022-05-24 NOTE — Telephone Encounter (Signed)
Original pharmacy closed. Reordered

## 2022-05-28 ENCOUNTER — Encounter: Payer: Self-pay | Admitting: Urology

## 2022-05-28 ENCOUNTER — Telehealth: Payer: Self-pay | Admitting: Gastroenterology

## 2022-05-28 ENCOUNTER — Ambulatory Visit: Payer: 59 | Admitting: Urology

## 2022-05-28 ENCOUNTER — Ambulatory Visit
Admission: RE | Admit: 2022-05-28 | Discharge: 2022-05-28 | Disposition: A | Discharge status: Home / Self Care | Payer: 59 | Source: Ambulatory Visit | Attending: Urology | Admitting: Urology

## 2022-05-28 VITALS — BP 151/84 | HR 80 | Ht 71.0 in | Wt 327.0 lb

## 2022-05-28 DIAGNOSIS — D1779 Benign lipomatous neoplasm of other sites: Secondary | ICD-10-CM

## 2022-05-28 DIAGNOSIS — N132 Hydronephrosis with renal and ureteral calculous obstruction: Secondary | ICD-10-CM | POA: Diagnosis not present

## 2022-05-28 DIAGNOSIS — I878 Other specified disorders of veins: Secondary | ICD-10-CM | POA: Diagnosis not present

## 2022-05-28 DIAGNOSIS — N200 Calculus of kidney: Secondary | ICD-10-CM | POA: Insufficient documentation

## 2022-05-28 DIAGNOSIS — Z87442 Personal history of urinary calculi: Secondary | ICD-10-CM | POA: Diagnosis not present

## 2022-05-28 DIAGNOSIS — N201 Calculus of ureter: Secondary | ICD-10-CM

## 2022-05-28 LAB — URINALYSIS, COMPLETE
Bilirubin, UA: NEGATIVE
Glucose, UA: NEGATIVE
Ketones, UA: NEGATIVE
Leukocytes,UA: NEGATIVE
Nitrite, UA: NEGATIVE
Protein,UA: NEGATIVE
Specific Gravity, UA: 1.01 (ref 1.005–1.030)
Urobilinogen, Ur: 0.2 mg/dL (ref 0.2–1.0)
pH, UA: 6 (ref 5.0–7.5)

## 2022-05-28 LAB — MICROSCOPIC EXAMINATION: Bacteria, UA: NONE SEEN

## 2022-05-28 MED ORDER — TAMSULOSIN HCL 0.4 MG PO CAPS: 0.4000 mg | ORAL_CAPSULE | Freq: Every day | ORAL | 0 refills | Status: DC

## 2022-05-28 NOTE — Progress Notes (Unsigned)
05/28/2022 10:52 AM   [name] 06-14-1966 [id number]  Referring provider: Owens Loffler, MD [address] [place],  [place] [address]  Chief Complaint  Patient presents with   Nephrolithiasis    HPI: Brandon [name] is a 56 y.o. male presents in follow-up for a recent ED visit for renal colic.  [facility] ED visit 05/23/2022 for moderate to severe right flank pain Radiation to RLQ/right testis No nausea, vomiting, fever, chills No prior history of stone disease UA 21-50 RBCs with calcium oxalate crystals CT with a 5 mm right UVJ calculus with mild hydronephrosis/hydroureter Incidentally noted 4.8 cm left adrenal mass consistent with a benign myelolipoma.  This it is increased from 4.3 cm on a prior scan in 2009 Since ED visit no severe pain.  Has not taken oxycodone in 2 days.  Mild discomfort at times.   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   Foraminal stenosis of cervical region    GERD (gastroesophageal reflux disease)    Hypertension    Ulcerative colitis ([place]) 04/28/2018   Ventricular ectopy     Surgical History: Past Surgical History:  Procedure Laterality Date   CARDIAC CATHETERIZATION     COLONOSCOPY  05/2014   COLONOSCOPY WITH PROPOFOL N/A 04/20/2018   Procedure: COLONOSCOPY WITH PROPOFOL;  Surgeon: Lollie Sails, MD;  Location: [facility] ENDOSCOPY;  Service: Endoscopy;  Laterality: N/A;   COLONOSCOPY WITH PROPOFOL N/A 06/20/2019   Procedure: COLONOSCOPY WITH PROPOFOL;  Surgeon: Lollie Sails, MD;  Location: [facility] ENDOSCOPY;  Service: Endoscopy;   Laterality: N/A;   FOOT SURGERY     left foot   ROTATOR CUFF REPAIR Right    SHOULDER ARTHROSCOPY WITH SUBACROMIAL DECOMPRESSION AND BICEP TENDON REPAIR Right 12/14/2018   Procedure: SHOULDER ARTHROSCOPY WITH EBRIDEMENT, DECOMPRESSION, DISTAL CLAVICLE EXCISION, BICEP TENODESIS AND DEBRIDEMENT, AND REPAIR OF ROTATOR CUFF TEAR;  Surgeon: Corky Mull, MD;  Location: [facility] ORS;  Service: Orthopedics;  Laterality: Right;   SHOULDER ARTHROSCOPY WITH SUBACROMIAL DECOMPRESSION, ROTATOR CUFF REPAIR AND BICEP TENDON REPAIR Left 02/12/2021   Procedure: LEFT SHOULDER ARTHROSCOPY WITH DEBRIDEMENT, DECOMPRESSION, ROTATOR CUFF REPAIR, BICEPS TENODESIS, AND DISTAL CLAVICLE EXCISION;  Surgeon: Corky Mull, MD;  Location: [facility] ORS;  Service: Orthopedics;  Laterality: Left;   TONSILLECTOMY      Home Medications:  Allergies as of 05/28/2022       Reactions   Biaxin [clarithromycin] Other (See Comments)   Pt has history of arrhythmias   Penicillins Rash   Did it involve swelling of the face/tongue/throat, SOB, or low BP? Unknown Did it involve sudden or severe rash/hives, skin peeling, or any reaction on the inside of your mouth or nose? Unknown Did you need to seek medical attention at a hospital or doctor's office? Unknown When did it last happen?      childhood allergy If all above answers are "NO", may proceed with cephalosporin use.        Medication List        Accurate as of May 28, 2022 10:52 AM. If you have any questions, ask your nurse or doctor.  STOP taking these medications    predniSONE 20 MG tablet Commonly known as: DELTASONE Stopped by: Abbie Sons, MD   predniSONE 5 MG tablet Commonly known as: DELTASONE Stopped by: Abbie Sons, MD       TAKE these medications    acetaminophen 500 MG tablet Commonly known as: TYLENOL Take 1,000 mg by mouth every 6 (six) hours as needed for moderate pain.   B-D 3CC LUER-LOK SYR 22GX1" 22G X 1" 3 ML Misc Generic  drug: SYRINGE-NEEDLE (DISP) 3 ML USE TO INJECT B 12 INJECTION   cyanocobalamin 1000 MCG/ML injection Commonly known as: (VITAMIN B-12) Inject 1,000 mcg into the muscle every 30 (thirty) days.   diazepam 5 MG tablet Commonly known as: VALIUM TAKE 1 TABLET BY MOUTH EVERY 8 HOURS AS NEEDED FOR ANXIETY.   diltiazem 180 MG 24 hr capsule Commonly known as: CARDIZEM CD TAKE 1 CAPSULE BY MOUTH EVERY DAY   flecainide 100 MG tablet Commonly known as: TAMBOCOR TAKE 1 TABLET BY MOUTH 2 (TWO) TIMES DAILY. PLEASE KEEP APPOINTMENT SCHEDULED IN OCTOBER   losartan 25 MG tablet Commonly known as: COZAAR Take 1 tablet (25 mg total) by mouth daily.   mesalamine 4 g enema Commonly known as: ROWASA Place 60 mLs (4 g total) rectally at bedtime.   mesalamine 1.2 g EC tablet Commonly known as: LIALDA Take 4 tablets (4.8 g total) by mouth daily with breakfast.   Na Sulfate-K Sulfate-Mg Sulf 17.5-3.13-1.6 GM/177ML Soln Commonly known as: Suprep Bowel Prep Kit Take 1 kit by mouth as directed. For colonoscopy prep   ondansetron 8 MG disintegrating tablet Commonly known as: ZOFRAN-ODT Take 1 tablet (8 mg total) by mouth every 8 (eight) hours as needed for nausea or vomiting.   oxyCODONE 5 MG immediate release tablet Commonly known as: Roxicodone Take 1 tablet (5 mg total) by mouth every 8 (eight) hours as needed.   tamsulosin 0.4 MG Caps capsule Commonly known as: FLOMAX Take 1 capsule (0.4 mg total) by mouth daily for 7 days.   TURMERIC PO Take 1,000 mg by mouth 2 (two) times daily.   vitamin C 500 MG tablet Commonly known as: ASCORBIC ACID Take 500 mg by mouth 2 (two) times daily.   Vitamin D (Cholecalciferol) 10 MCG (400 UNIT) Caps Take 400 Units by mouth 2 (two) times daily.        Allergies:  Allergies  Allergen Reactions   Biaxin [Clarithromycin] Other (See Comments)    Pt has history of arrhythmias   Penicillins Rash    Did it involve swelling of the face/tongue/throat,  SOB, or low BP? Unknown Did it involve sudden or severe rash/hives, skin peeling, or any reaction on the inside of your mouth or nose? Unknown Did you need to seek medical attention at a hospital or doctor's office? Unknown When did it last happen?      childhood allergy If all above answers are "NO", may proceed with cephalosporin use.     Family History: Family History  Problem Relation Age of Onset   Colon polyps Mother    Stroke Father    Arthritis Other    Diabetes Other        1st degree relative   Stroke Other        F 1st degree relative <60; M 1st degree relative <50    Social History:  reports that he has never smoked. He has quit using smokeless tobacco.  His smokeless tobacco use included snuff. He reports  that he does not currently use alcohol. He reports that he does not use drugs.   Physical Exam: BP (!) 151/84   Pulse 80   Ht _0  (1.803 m)   Wt (!) 327 lb (148.3 kg)   BMI 45.61 kg/m   Constitutional:  Alert and oriented, No acute distress. HEENT: [place] AT Respiratory: Normal respiratory effort, no increased work of breathing. Psychiatric: Normal mood and affect.   Pertinent Imaging: CT images were personally reviewed and interpreted  CT Renal Stone Study  Narrative CLINICAL DATA:  Flank pain, kidney stone suspected. Pt states sudden onset of right flank pain an hour ago. Pt denies nausea. Pt denies nausea, known fever, states no known hematuria. Pt with history of a fib  EXAM: CT ABDOMEN AND PELVIS WITHOUT CONTRAST  TECHNIQUE: Multidetector CT imaging of the abdomen and pelvis was performed following the standard protocol without IV contrast.  RADIATION DOSE REDUCTION: This exam was performed according to the departmental dose-optimization program which includes automated exposure control, adjustment of the mA and/or kV according to patient size and/or use of iterative reconstruction technique.  COMPARISON:  CT abdomen pelvis  05/29/2008  FINDINGS: Lower chest: No acute abnormality.  Hepatobiliary: No focal liver abnormality. No gallstones, gallbladder wall thickening, or pericholecystic fluid. No biliary dilatation.  Pancreas: No focal lesion. Normal pancreatic contour. No surrounding inflammatory changes. No main pancreatic ductal dilatation.  Spleen: Normal in size without focal abnormality. Splenule is noted.  Adrenals/Urinary Tract:  Interval increase in size of a heterogeneous 4.8 x 4.3 cm left adrenal gland nodule with associated fat density (-45 Hounsfield units). Interval increase in size of a fat density (-94 Hounsfield units) 3.7 x 4.3 cm right adrenal gland nodule suggestive myelolipoma.  Bilateral kidneys enhance symmetrically. A 5 mm calcified stone at the right ureterovesicular junction with associated proximal mild hydroureteronephrosis.  No left hydronephrosis. No left hydroureter. Left kidney subcentimeter hypodensity too small to characterize.  The urinary bladder is unremarkable.  Stomach/Bowel: Stomach is within normal limits. No evidence of bowel wall thickening or dilatation. Scattered colonic diverticulosis. Appendix appears normal.  Vascular/Lymphatic: No abdominal aorta or iliac aneurysm. No abdominal, pelvic, or inguinal lymphadenopathy.  Reproductive: Prostate is unremarkable.  Other: No intraperitoneal free fluid. No intraperitoneal free gas. No organized fluid collection.  Musculoskeletal:  No abdominal wall hernia or abnormality.  No suspicious lytic or blastic osseous lesions. No acute displaced fracture.  IMPRESSION: 1. Obstructive 5 mm right ureterovesicular junction stone. 2. Interval increase in size of a 4.8 cm left adrenal mass, consistent with lipid-rich benign adenoma. No follow-up imaging is recommended. JACR 2017 Aug; 14(8):1038-44, JCAT 2016 Mar-Apr; 40(2):194-200, Urol J 2006 Spring; 3(2):71-4. 3. Interval increase in size of a 4.3 cm right  adrenal mass, consistent with benign myelolipoma. Surgical consultation is recommended as myelolipomas > 4 cm are at an increased risk for hemorrhage and rupture. Consider biochemical lab evaluation for functional status and pheochromocytoma prior to resection. JACR 2017 Aug; 14(8):1038-44, JCAT 2016 Mar-Apr; 40(2):194-200, Urol J 2006 Spring; 3(2):71-4. 4. Colonic diverticulosis with no acute diverticulitis.   Electronically Signed By: Iven Finn M.D. On: 05/23/2022 20:06   Assessment & Plan:    1.  Right ureteral calculus We discussed various treatment options for urolithiasis including observation with or without medical expulsive therapy, shockwave lithotripsy (SWL), ureteroscopy and laser lithotripsy with stent placement. We discussed that management is based on stone size, location, density, patient co-morbidities, and patient preference.  Stones <77m in size have a >80% spontaneous  passage rate. Data surrounding the use of tamsulosin for medical expulsive therapy is controversial, but meta analyses suggests it is most efficacious for distal stones between 5-31m in size.  SWL has a lower stone free rate in a single procedure, but also a lower complication rate compared to ureteroscopy and avoids a stent and associated stent related symptoms. Possible complications include renal hematoma, steinstrasse, and need for additional treatment. Ureteroscopy with laser lithotripsy and stent placement has a higher stone free rate than SWL in a single procedure, however increased complication rate including possible infection, ureteral injury, bleeding, and stent related morbidity. Common stent related symptoms include dysuria, urgency/frequency, and flank pain. After a discussion of the risks and benefits of the above treatment options, the patient would like to initially proceed with MET Tamsulosin refilled KUB ordered today to see if calculus visualized on plain imaging.  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

## 2022-05-28 NOTE — Telephone Encounter (Signed)
Inbound call from patient stating that he is scheduled for a colonoscopy on 8/4 at 9:00. Patient stated that he has Kidney stones and is wanting to know if he needs to reschedule his procedure. Please advise.

## 2022-05-28 NOTE — Telephone Encounter (Signed)
Called patient. He saw Urology today. He has one stone that is 53m. Not in the bladder yet.  He said the pain is off and on, sometimes worse but usually bearable. The urologist said that if he has not passed it in the next few days they would take it out.  I encouraged the patient to give it over the weekend to see if it will pass and call [place] Monday morning if he needs to reschedule his colonoscopy.  He was agreeable to plan.

## 2022-05-29 ENCOUNTER — Encounter: Payer: Self-pay | Admitting: Urology

## 2022-06-02 ENCOUNTER — Encounter: Payer: Self-pay | Admitting: Gastroenterology

## 2022-06-03 HISTORY — PX: COLONOSCOPY WITH PROPOFOL: SHX5780

## 2022-06-06 ENCOUNTER — Encounter: Payer: Self-pay | Admitting: Gastroenterology

## 2022-06-06 ENCOUNTER — Ambulatory Visit (AMBULATORY_SURGERY_CENTER): Payer: 59 | Admitting: Gastroenterology

## 2022-06-06 ENCOUNTER — Other Ambulatory Visit: Payer: 59

## 2022-06-06 VITALS — BP 134/82 | HR 72 | Temp 98.4°F | Resp 16 | Ht 74.0 in | Wt 314.0 lb

## 2022-06-06 DIAGNOSIS — K51318 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: Secondary | ICD-10-CM

## 2022-06-06 DIAGNOSIS — K529 Noninfective gastroenteritis and colitis, unspecified: Secondary | ICD-10-CM

## 2022-06-06 DIAGNOSIS — K5150 Left sided colitis without complications: Secondary | ICD-10-CM | POA: Diagnosis not present

## 2022-06-06 MED ORDER — SODIUM CHLORIDE 0.9 % IV SOLN: 500.0000 mL | Freq: Once | INTRAVENOUS | Status: DC

## 2022-06-06 NOTE — Patient Instructions (Addendum)
YOU HAD AN ENDOSCOPIC PROCEDURE TODAY AT [facility]:   Refer to the procedure report that was given to you for any specific questions about what was found during the examination.  If the procedure report does not answer your questions, please call your gastroenterologist to clarify.  If you requested that your care partner not be given the details of your procedure findings, then the procedure report has been included in a sealed envelope for you to review at your convenience later.  YOU SHOULD EXPECT: Some feelings of bloating in the abdomen. Passage of more gas than usual.  Walking can help get rid of the air that was put into your GI tract during the procedure and reduce the bloating. If you had a lower endoscopy (such as a colonoscopy or flexible sigmoidoscopy) you may notice spotting of blood in your stool or on the toilet paper. If you underwent a bowel prep for your procedure, you may not have a normal bowel movement for a few days.  Please Note:  You might notice some irritation and congestion in your nose or some drainage.  This is from the oxygen used during your procedure.  There is no need for concern and it should clear up in a day or so.  SYMPTOMS TO REPORT IMMEDIATELY:  Following lower endoscopy (colonoscopy or flexible sigmoidoscopy):  Excessive amounts of blood in the stool  Significant tenderness or worsening of abdominal pains  Swelling of the abdomen that is new, acute  Fever of 100F or higher   For urgent or emergent issues, a gastroenterologist can be reached at any hour by calling [phone]. Do not use MyChart messaging for urgent concerns.    DIET:  We do recommend a small meal at first, but then you may proceed to your regular diet.  Drink plenty of fluids but you should avoid alcoholic beverages for 24 hours.  MEDICATIONS: Continue present medications.  Please see handouts given to you by your recovery nurse.  We will be taking you to the lab  on your way out today prior to discharge for TB and hepatitis testing  to ensure these are negative prior to starting biologic therapy.  Thank you for allowing [place] to provide for your healthcare needs today.  ACTIVITY:  You should plan to take it easy for the rest of today and you should NOT DRIVE or use heavy machinery until tomorrow (because of the sedation medicines used during the test).    FOLLOW UP: Our staff will call the number listed on your records the next business day following your procedure.  We will call around 7:15- 8:00 am to check on you and address any questions or concerns that you may have regarding the information given to you following your procedure. If we do not reach you, we will leave a message.  If you develop any symptoms (ie: fever, flu-like symptoms, shortness of breath, cough etc.) before then, please call [phone].  If you test positive for Covid 19 in the 2 weeks post procedure, please call and report this information to [place].    If any biopsies were taken you will be contacted by phone or by letter within the next 1-3 weeks.  Please call us at [phone] if you have not heard about the biopsies in 3 weeks.    SIGNATURES/CONFIDENTIALITY: You and/or your care partner have signed paperwork which will be entered into your electronic medical record.  These signatures attest to the fact that that the  information above on your After Visit Summary has been reviewed and is understood.  Full responsibility of the confidentiality of this discharge information lies with you and/or your care-partner.

## 2022-06-06 NOTE — Progress Notes (Signed)
PT taken to PACU. Monitors in place. VSS. Report given to RN. 

## 2022-06-06 NOTE — Progress Notes (Signed)
Called to room to assist during endoscopic procedure.  Patient ID and intended procedure confirmed with present staff. Received instructions for my participation in the procedure from the performing physician.  

## 2022-06-06 NOTE — Progress Notes (Signed)
[facility] History and Physical   Primary Care Physician:  Owens Loffler, MD   Reason for Procedure:   Left sided colitis surveillance  Plan:    colonoscopy     HPI: Brandon [name] is a 55 y.o. male  here for colonoscopy surveillance of left sided UC. On Rowasa daily and Lialda daily - prior colonoscopies have shown active disease and elevated fecal calprotectin. Doing better with regimen now. Patient denies any bowel symptoms at this time. No family history of colon cancer known. Otherwise feels well without any cardiopulmonary symptoms.   I have discussed risks / benefits of anesthesia and endoscopic procedure with [name] and they wish to proceed with the exams as outlined today.    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   Foraminal stenosis of cervical region    GERD (gastroesophageal reflux disease)    Hypertension    Ulcerative colitis ([place]) 04/28/2018   Ventricular ectopy     Past Surgical History:  Procedure Laterality Date   CARDIAC CATHETERIZATION     COLONOSCOPY  05/2014   COLONOSCOPY WITH PROPOFOL N/A 04/20/2018   Procedure: COLONOSCOPY WITH PROPOFOL;  Surgeon: Lollie Sails, MD;  Location: [facility] ENDOSCOPY;  Service: Endoscopy;  Laterality: N/A;   COLONOSCOPY WITH PROPOFOL N/A 06/20/2019   Procedure: COLONOSCOPY WITH PROPOFOL;  Surgeon: Lollie Sails, MD;  Location: [facility] ENDOSCOPY;  Service: Endoscopy;  Laterality: N/A;   FOOT SURGERY     left foot   ROTATOR CUFF REPAIR Right    SHOULDER ARTHROSCOPY WITH SUBACROMIAL DECOMPRESSION  AND BICEP TENDON REPAIR Right 12/14/2018   Procedure: SHOULDER ARTHROSCOPY WITH EBRIDEMENT, DECOMPRESSION, DISTAL CLAVICLE EXCISION, BICEP TENODESIS AND DEBRIDEMENT, AND REPAIR OF ROTATOR CUFF TEAR;  Surgeon: Corky Mull, MD;  Location: [facility] ORS;  Service: Orthopedics;  Laterality: Right;   SHOULDER ARTHROSCOPY WITH SUBACROMIAL DECOMPRESSION, ROTATOR CUFF REPAIR AND BICEP TENDON REPAIR Left 02/12/2021   Procedure: LEFT SHOULDER ARTHROSCOPY WITH DEBRIDEMENT, DECOMPRESSION, ROTATOR CUFF REPAIR, BICEPS TENODESIS, AND DISTAL CLAVICLE EXCISION;  Surgeon: Corky Mull, MD;  Location: [facility] ORS;  Service: Orthopedics;  Laterality: Left;   TONSILLECTOMY      Prior to Admission medications   Medication Sig Start Date End Date Taking? Authorizing Provider  diltiazem (CARDIZEM CD) 180 MG 24 hr capsule TAKE 1 CAPSULE BY MOUTH EVERY DAY 08/07/21  Yes Gollan, Kathlene November, MD  flecainide (TAMBOCOR) 100 MG tablet TAKE 1 TABLET BY MOUTH 2 (TWO) TIMES DAILY. PLEASE KEEP APPOINTMENT SCHEDULED IN OCTOBER 08/07/21  Yes Gollan, Kathlene November, MD  losartan (COZAAR) 25 MG tablet Take 1 tablet (25 mg total) by mouth daily. 08/07/21  Yes Minna Merritts, MD  mesalamine (LIALDA) 1.2 g EC tablet Take 4 tablets (4.8 g total) by mouth daily with breakfast. 04/11/22 04/11/23 Yes Brelan Hannen, Carlota Raspberry, MD  acetaminophen (TYLENOL) 500 MG tablet Take 1,000 mg by mouth every 6 (six) hours as needed for moderate pain.     [provider]  cyanocobalamin (,VITAMIN B-12,) 1000 MCG/ML injection Inject 1,000 mcg into the muscle every 30 (thirty) days.  [provider]  diazepam (VALIUM) 5 MG tablet TAKE 1 TABLET BY MOUTH EVERY 8 HOURS AS NEEDED FOR ANXIETY. 08/05/21   Copland, Frederico Hamman, MD  mesalamine (ROWASA) 4 g enema Place 60 mLs (4 g total) rectally at bedtime. 04/11/22 05/11/22  Samayra Hebel, Carlota Raspberry, MD  oxyCODONE (ROXICODONE) 5 MG immediate release tablet Take 1 tablet (5 mg total) by mouth every 8 (eight) hours as needed.  05/24/22 05/24/23  Naaman Plummer, MD  SYRINGE-NEEDLE, DISP, 3 ML (B-D 3CC LUER-LOK SYR 22GX1") 22G X 1" 3 ML MISC USE TO INJECT B 12 INJECTION 12/23/20   [provider]  TURMERIC PO Take 1,000 mg by mouth 2 (two) times daily.    [provider]  vitamin C (ASCORBIC ACID) 500 MG tablet Take 500 mg by mouth 2 (two) times daily.    [provider]  Vitamin D, Cholecalciferol, 10 MCG (400 UNIT) CAPS Take 400 Units by mouth 2 (two) times daily.    [provider]    Current Outpatient Medications  Medication Sig Dispense Refill   diltiazem (CARDIZEM CD) 180 MG 24 hr capsule TAKE 1 CAPSULE BY MOUTH EVERY DAY 90 capsule 3   flecainide (TAMBOCOR) 100 MG tablet TAKE 1 TABLET BY MOUTH 2 (TWO) TIMES DAILY. PLEASE KEEP APPOINTMENT SCHEDULED IN OCTOBER 180 tablet 3   losartan (COZAAR) 25 MG tablet Take 1 tablet (25 mg total) by mouth daily. 90 tablet 3   mesalamine (LIALDA) 1.2 g EC tablet Take 4 tablets (4.8 g total) by mouth daily with breakfast. 120 tablet 3   acetaminophen (TYLENOL) 500 MG tablet Take 1,000 mg by mouth every 6 (six) hours as needed for moderate pain.      cyanocobalamin (,VITAMIN B-12,) 1000 MCG/ML injection Inject 1,000 mcg into the muscle every 30 (thirty) days.     diazepam (VALIUM) 5 MG tablet TAKE 1 TABLET BY MOUTH EVERY 8 HOURS AS NEEDED FOR ANXIETY. 30 tablet 0   mesalamine (ROWASA) 4 g enema Place 60 mLs (4 g total) rectally at bedtime. 1800 mL 2   oxyCODONE (ROXICODONE) 5 MG immediate release tablet Take 1 tablet (5 mg total) by mouth every 8 (eight) hours as needed. 20 tablet 0   SYRINGE-NEEDLE, DISP, 3 ML (B-D 3CC LUER-LOK SYR 22GX1") 22G X 1" 3 ML MISC USE TO INJECT B 12 INJECTION     TURMERIC PO Take 1,000 mg by mouth 2 (two) times daily.     vitamin C (ASCORBIC ACID) 500 MG tablet Take 500 mg by mouth 2 (two) times daily.     Vitamin D, Cholecalciferol, 10 MCG (400 UNIT) CAPS Take 400 Units by mouth 2 (two) times daily.     Current  Facility-Administered Medications  Medication Dose Route Frequency Provider Last Rate Last Admin   0.9 %  sodium chloride infusion  500 mL Intravenous Once Lively Haberman, Carlota Raspberry, MD        Allergies as of 06/06/2022 - Review Complete 06/06/2022  Allergen Reaction Noted   Biaxin [clarithromycin] Other (See Comments) 01/31/2013   Penicillins Rash 06/07/2009    Family History  Problem Relation Age of Onset   Colon polyps Mother    Stroke Father    Arthritis Other    Diabetes Other        1st degree relative   Stroke Other        F 1st degree relative <60; M 1st degree relative <50   Colon cancer Neg Hx    Stomach cancer Neg  Hx    Esophageal cancer Neg Hx     Social History   Socioeconomic History   Marital status: Single    Spouse name: Not on file   Number of children: Not on file   Years of education: Not on file   Highest education level: Not on file  Occupational History   Occupation: self employed    Employer: [employer]  Tobacco Use   Smoking status: Never   Smokeless tobacco: Former    Types: Snuff   Tobacco comments:    occasionally  Vaping Use   Vaping Use: Never used  Substance and Sexual Activity   Alcohol use: Not Currently    Alcohol/week: 0.0 standard drinks of alcohol   Drug use: Never   Sexual activity: Not on file  Other Topics Concern   Not on file  Social History Narrative   Does not do regular exercise.    Social Determinants of Health   Financial Resource Strain: Not on file  Food Insecurity: Not on file  Transportation Needs: Not on file  Physical Activity: Not on file  Stress: Not on file  Social Connections: Not on file  Intimate Partner Violence: Not on file    Review of Systems: All other review of systems negative except as mentioned in the HPI.  Physical Exam: Vital signs BP 132/79   Pulse 74   Temp 98.4 F (36.9 C)   Ht 6' 2"  (1.88 m)   Wt (!) 314 lb (142.4 kg)   SpO2 97%   BMI 40.32 kg/m   General:   Alert,   Well-developed, pleasant and cooperative in NAD Lungs:  Clear throughout to auscultation.   Heart:  Regular rate and rhythm Abdomen:  Soft, nontender and nondistended.   Neuro/Psych:  Alert and cooperative. Normal mood and affect. A and O x 3  Jolly Mango, MD [facility]

## 2022-06-06 NOTE — Op Note (Signed)
[facility] Patient Name: Brandon [name] Procedure Date: 06/06/2022 9:19 AM MRN: [id number] Endoscopist: Remo Lipps P. Havery Moros , MD Age: 56 Referring MD:  Date of Birth: 01-12-1966 Gender: Male Account #: [id number] Procedure:                Colonoscopy Indications:              Disease activity assessment of left-sided chronic                            ulcerative colitis - historically has been on                            numerous regimens of meslamine - currently on                            Lialda and Rowasa daily, last exam 2020 Medicines:                Monitored Anesthesia Care Procedure:                Pre-Anesthesia Assessment:                           - Prior to the procedure, a History and Physical                            was performed, and patient medications and                            allergies were reviewed. The patient's tolerance of                            previous anesthesia was also reviewed. The risks                            and benefits of the procedure and the sedation                            options and risks were discussed with the patient.                            All questions were answered, and informed consent                            was obtained. Prior Anticoagulants: The patient has                            taken no previous anticoagulant or antiplatelet                            agents. ASA Grade Assessment: III - A patient with                            severe systemic disease. After reviewing the risks  and benefits, the patient was deemed in                            satisfactory condition to undergo the procedure.                           After obtaining informed consent, the colonoscope                            was passed under direct vision. Throughout the                            procedure, the patient's blood pressure, pulse, and                            oxygen saturations were  monitored continuously. The                            CF [id number] #[id number] was introduced through the anus                            and advanced to the the cecum, identified by                            appendiceal orifice and ileocecal valve. The                            colonoscopy was performed without difficulty. The                            patient tolerated the procedure well. The quality                            of the bowel preparation was adequate. The                            ileocecal valve, appendiceal orifice, and rectum                            were photographed. Scope In: 9:25:24 AM Scope Out: 9:47:65 AM Scope Withdrawal Time: 0 hours 15 minutes 12 seconds  Total Procedure Duration: 0 hours 20 minutes 53 seconds  Findings:                 The perianal and digital rectal examinations were                            normal.                           Diffuse mild to moderate inflammation characterized                            by erythema, friability, granularity and loss of  vascularity was found in the rectum, in the                            recto-sigmoid colon, in the sigmoid colon, in the                            descending colon, at the splenic flexure, in the                            transverse colon, at the hepatic flexure and in the                            mid ascending colon. Mostly normal rectum, very                            mild changes, and lower sigmoid, but from mid                            sigmoid to mid ascending there was mild to moderate                            inflammation. Biopsies were taken with a cold                            forceps for histology.                           Multiple small-mouthed diverticula were found in                            the sigmoid colon and transverse colon.                           Internal hemorrhoids were found during retroflexion.                           The  exam was otherwise without abnormality. Due to                            looping, ileal intubation not possible Complications:            No immediate complications. Estimated blood loss:                            Minimal. Estimated Blood Loss:     Estimated blood loss was minimal. Impression:               - Diffuse mild inflammation was found in the                            rectum, in the recto-sigmoid colon, in the sigmoid                            colon, in the descending colon, at the splenic  flexure, in the transverse colon, at the hepatic                            flexure and in the mid ascending colon secondary to                            pancolitis ulcerative colitis. Biopsied.                           - Diverticulosis in the sigmoid colon and in the                            transverse colon.                           - Internal hemorrhoids.                           - The examination was otherwise normal.                           Unfortunately colitis is not controlled with oral /                            rectal mesalamine and disease has progressed                            involve most of the colon. He warrants biologic                            therapy at this point in time, will discuss options                            with him. Consider steroid taper if symtomatic                            while we transition therapy. Recommendation:           - Patient has a contact number available for                            emergencies. The signs and symptoms of potential                            delayed complications were discussed with the                            patient. Return to normal activities tomorrow.                            Written discharge instructions were provided to the                            patient.                           -  Resume previous diet.                           - Continue present medications.                            - Await pathology results.                           - Go to the lab for TB and hepatitis testing to                            ensure negative as we prepare for biologic therapy Remo Lipps P. Thelbert Gartin, MD 06/06/2022 9:59:39 AM This report has been signed electronically.

## 2022-06-09 ENCOUNTER — Telehealth: Payer: Self-pay | Admitting: *Deleted

## 2022-06-09 NOTE — Telephone Encounter (Signed)
  Follow up Call-     06/06/2022    8:03 AM  Call back number  Post procedure Call Back phone  # [phone]  Permission to leave phone message Yes     Patient questions:  Do you have a fever, pain , or abdominal swelling? No. Pain Score  0 *  Have you tolerated food without any problems? Yes.    Have you been able to return to your normal activities? Yes.    Do you have any questions about your discharge instructions: Diet   No. Medications  No. Follow up visit  No.  Do you have questions or concerns about your Care? No.  Actions: * If pain score is 4 or above: No action needed, pain <4.

## 2022-06-10 DIAGNOSIS — H2000 Unspecified acute and subacute iridocyclitis: Secondary | ICD-10-CM | POA: Diagnosis not present

## 2022-06-10 LAB — QUANTIFERON-TB GOLD PLUS
Mitogen-NIL: >10 IU/mL
NIL: 0.02 IU/mL
QuantiFERON-TB Gold Plus: NEGATIVE
TB1-NIL: 0.01 IU/mL
TB2-NIL: 0 IU/mL

## 2022-06-10 LAB — HEPATITIS B SURFACE ANTIGEN: Hepatitis B Surface Ag: NONREACTIVE

## 2022-06-10 LAB — HEPATITIS C ANTIBODY: Hepatitis C Ab: NONREACTIVE

## 2022-06-10 LAB — HEPATITIS B CORE ANTIBODY, TOTAL: Hep B Core Total Ab: NONREACTIVE

## 2022-06-12 ENCOUNTER — Telehealth: Payer: Self-pay | Admitting: Pharmacy Technician

## 2022-06-12 ENCOUNTER — Encounter: Payer: Self-pay | Admitting: Gastroenterology

## 2022-06-12 DIAGNOSIS — K51318 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: Secondary | ICD-10-CM | POA: Insufficient documentation

## 2022-06-12 NOTE — Addendum Note (Signed)
Addended by: Yevette Edwards on: 06/12/2022 01:21 PM   Modules accepted: Orders

## 2022-06-12 NOTE — Telephone Encounter (Addendum)
Auth Submission: APPROVED Payer: [employer] Medication & CPT/J Code(s) submitted: Entyvio (Vedolizumab) [id number] Route of submission (phone, fax, portal): COVER MY MEDS Auth type: Buy/Bill Units/visits requested: 300MG per visit Reference number: [id number] Approval from: 06/12/22 to 06/12/23 at [place] as Buy/Bill    Entyvio co-opay card approved:  Id: [id number] Gr: [id number] BIN: [id number] PCN: [id number] FAX: [phone]

## 2022-06-13 ENCOUNTER — Other Ambulatory Visit: Payer: Self-pay | Admitting: Urology

## 2022-06-13 ENCOUNTER — Ambulatory Visit: Payer: 59 | Admitting: Urology

## 2022-06-13 DIAGNOSIS — N201 Calculus of ureter: Secondary | ICD-10-CM

## 2022-06-13 NOTE — Telephone Encounter (Signed)
Patient came in this morning and thought that he was here to have surgery. I spoke with Dr. Bernardo Heater and he was to call in to speak with Melissa to schedule surgery. After speaking with the patient he said that he would think about it and cal back. I asked if he had passed his stone and he said he didn't think so. I advised him to call the office back once he had made a decision and speak with Melissa.    Sharyn Lull

## 2022-06-13 NOTE — Progress Notes (Signed)
Surgical Physician Order Form [facility] [place]  * Scheduling expectation : Next Available  *Length of Case: 30 minutes  *Clearance needed: no  *Anticoagulation Instructions: N/A  *Aspirin Instructions: N/A  *Post-op visit Date/Instructions:  1 week cysto stent removal  *Diagnosis: Right Ureteral Stone  *Procedure:  Right  Ureteroscopy w/laser lithotripsy & stent placement (18984)   Additional orders: N/A  -Admit type: OUTpatient  -Anesthesia: Choice  -VTE Prophylaxis Standing Order SCD's       Other:   -Standing Lab Orders Per Anesthesia    Lab other: UA&Urine Culture  -Standing Test orders EKG/Chest x-ray per Anesthesia       Test other:   - Medications:  Cipro 492m IV  -Other orders:   If patient is completely asymptomatic we can get a follow-up CT of the pelvis to see if stone is still present.  It was not seen on [facility]

## 2022-06-17 DIAGNOSIS — H2000 Unspecified acute and subacute iridocyclitis: Secondary | ICD-10-CM | POA: Diagnosis not present

## 2022-06-18 ENCOUNTER — Encounter: Payer: Self-pay | Admitting: Gastroenterology

## 2022-06-18 MED ORDER — CYANOCOBALAMIN 1000 MCG/ML IJ SOLN: 1000.0000 ug | INTRAMUSCULAR | 2 refills | Status: DC

## 2022-06-19 NOTE — Telephone Encounter (Signed)
Noted, thanks!

## 2022-06-19 NOTE — Telephone Encounter (Signed)
Wonderful, thank you for your help with this.

## 2022-06-25 ENCOUNTER — Telehealth: Payer: Self-pay

## 2022-06-25 NOTE — Telephone Encounter (Signed)
I have called patient several times and sent mychart messages which have been read to schedule surgery without answer. Patient will call back to schedule surgery if having symptoms. Removing order from Surgery Coordinator InBasket.

## 2022-06-26 ENCOUNTER — Ambulatory Visit (INDEPENDENT_AMBULATORY_CARE_PROVIDER_SITE_OTHER): Payer: 59

## 2022-06-26 VITALS — BP 143/85 | HR 61 | Temp 98.3°F | Resp 18 | Ht 74.0 in | Wt 322.0 lb

## 2022-06-26 DIAGNOSIS — K51318 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: Secondary | ICD-10-CM

## 2022-06-26 MED ORDER — VEDOLIZUMAB 300 MG IV SOLR
300.0000 mg | Freq: Once | INTRAVENOUS | Status: AC
  Administered 2022-06-26: 300 mg via INTRAVENOUS
  Filled 2022-06-26: qty 5

## 2022-06-26 NOTE — Patient Instructions (Signed)
Vedolizumab Injection What is this medication? VEDOLIZUMAB (ve doe LIZ you mab) treats inflammatory bowel diseases. It works by decreasing inflammation in the digestive tract. This medicine may be used for other purposes; ask your health care provider or pharmacist if you have questions. COMMON BRAND NAME(S): Entyvio What should I tell my care team before I take this medication? They need to know if you have any of these conditions: Immune system problems Infection, such a tuberculosis (TB) or other bacterial, fungal, or viral infections Liver disease Recent or upcoming vaccine An unusual or allergic reaction to vedolizumab, other medications, foods, dyes, or preservatives Pregnant or trying to get pregnant Breast-feeding How should I use this medication? This medication is injected into a vein. It is given by your care team in a hospital or clinic setting. A special MedGuide will be given to you by the pharmacist with each prescription and refill. Be sure to read this information carefully each time. Talk to your care team about the use of this medication in children. It is not approved for use in children. Overdosage: If you think you have taken too much of this medicine contact a poison control center or emergency room at once. NOTE: This medicine is only for you. Do not share this medicine with others. What if I miss a dose? It is important not to miss your dose. Call your care team if you are unable to keep an appointment. What may interact with this medication? Steroid medications, such as prednisone or cortisone TNF-alpha inhibitors, such as natalizumab, adalimumab, and infliximab Vaccines This list may not describe all possible interactions. Give your health care provider a list of all the medicines, herbs, non-prescription drugs, or dietary supplements you use. Also tell them if you smoke, drink alcohol, or use illegal drugs. Some items may interact with your medicine. What should  I watch for while using this medication? Visit your care team for regular checks on your progress. Tell your care team if your symptoms do not start to get better or if they get worse. Stay away from people who are sick. Call your care team for advice if you get a fever, chills or sore throat, or other symptoms of a cold or flu. Do not treat yourself. In some patients, this medication may cause a serious brain infection that may cause death. If you have any problems seeing, thinking, speaking, walking, or standing, tell your care team right away. If you cannot reach your care team, get urgent medical care. What side effects may I notice from receiving this medication? Side effects that you should report to your care team as soon as possible: Allergic reactions--skin rash, itching, hives, swelling of the face, lips, tongue, or throat Dizziness, loss of balance or coordination, confusion or trouble speaking Infection--fever, chills, cough, or sore throat Infusion reactions--chest pain, shortness of breath or trouble breathing, feeling faint or lightheaded Liver injury--right upper belly pain, loss of appetite, nausea, light-colored stool, dark yellow or brown urine, yellowing skin or eyes, unusual weakness or fatigue Side effects that usually do not require medical attention (report to your care team if they continue or are bothersome): Headache Fatigue Joint pain Nausea This list may not describe all possible side effects. Call your doctor for medical advice about side effects. You may report side effects to FDA at 1-800-FDA-1088. Where should I keep my medication? This medication is given in a hospital or clinic and will not be stored at home. NOTE: This sheet is a summary. It  may not cover all possible information. If you have questions about this medicine, talk to your doctor, pharmacist, or health care provider.  2023 Elsevier/Gold Standard (2013-03-23 00:00:00)

## 2022-06-26 NOTE — Progress Notes (Signed)
Diagnosis: Ulcerative Rectosigmoiditis  Provider:  Marshell Garfinkel MD  Procedure: Infusion  IV Type: Peripheral, IV Location: R Antecubital  Entyvio (Vedolizumab), Dose: 300 mg  Infusion Start Time: 6148  Infusion Stop Time: 1011  Post Infusion IV Care: Observation period completed and Peripheral IV Discontinued  Discharge: Condition: Good, Destination: Home . AVS provided to patient.   Performed by:  Cleophus Molt, RN

## 2022-07-10 ENCOUNTER — Telehealth: Payer: Self-pay | Admitting: Gastroenterology

## 2022-07-10 ENCOUNTER — Ambulatory Visit (INDEPENDENT_AMBULATORY_CARE_PROVIDER_SITE_OTHER): Payer: 59

## 2022-07-10 VITALS — BP 138/84 | HR 65 | Temp 97.5°F | Resp 20 | Ht 71.0 in | Wt 321.8 lb

## 2022-07-10 DIAGNOSIS — K51318 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: Secondary | ICD-10-CM | POA: Diagnosis not present

## 2022-07-10 MED ORDER — VEDOLIZUMAB 300 MG IV SOLR: 300.0000 mg | INTRAVENOUS | 0 refills | Status: AC

## 2022-07-10 MED ORDER — VEDOLIZUMAB 300 MG IV SOLR
300.0000 mg | Freq: Once | INTRAVENOUS | Status: AC
  Administered 2022-07-10: 300 mg via INTRAVENOUS
  Filled 2022-07-10: qty 5

## 2022-07-10 NOTE — Telephone Encounter (Signed)
Called and spoke with patient regarding Dr. Doyne Keel recommendations as outlined below. Pt verbalized understanding and had no concerns at the end of the call.

## 2022-07-10 NOTE — Telephone Encounter (Signed)
Returned call to patient. He states that he received his 2nd Entyvio infusion today and wanted to know if he needed to continue oral mesalamine and if so, for how long. I told pt that I would check with you. He also wanted to know if it was OK for him to get flu vaccine, pneumococcal vaccine, and possible RSV vaccine. I told pt that it is fine that he gets vaccines since biologics can potentially suppress his immune system. Please advise on medications. Thanks

## 2022-07-10 NOTE — Telephone Encounter (Signed)
Thanks [employer]. If he is feeling well on Entyvio, okay to stop mesalamine. Generally, for vaccines, okay to do non-live virus vaccines on biologic therapy but live virus vaccines are generally not recommended for those on systemic immunosuppression. Flu shot, pneumococcal vaccine are both okay and recommended. COVID booster okay and recommended. Recommend Shingrix to prevent shingles if he has not had it yet. In regards to RSV, if it is not a live virus vaccine then okay to get it but he should double check with his pharmacist. Marland Mcalpine does not do much systemic immunosuppression otherwise. Thanks

## 2022-07-10 NOTE — Progress Notes (Signed)
Diagnosis: Ulcerative Rectosigmoiditis  Provider:  Marshell Garfinkel MD  Procedure: Infusion  IV Type: Peripheral, IV Location: R Antecubital  Entyvio (Vedolizumab), Dose: 300 mg  Infusion Start Time: 7076  Infusion Stop Time: 1518  Post Infusion IV Care: Peripheral IV Discontinued  Discharge: Condition: Good, Destination: Home . AVS provided to patient.   Performed by:  Arnoldo Morale, RN

## 2022-07-10 NOTE — Telephone Encounter (Signed)
Patient is requesting a call regarding medication.

## 2022-07-15 ENCOUNTER — Ambulatory Visit: Payer: 59 | Admitting: Gastroenterology

## 2022-07-16 ENCOUNTER — Other Ambulatory Visit: Payer: 59

## 2022-07-16 DIAGNOSIS — N201 Calculus of ureter: Secondary | ICD-10-CM

## 2022-07-16 LAB — URINALYSIS, COMPLETE
Bilirubin, UA: NEGATIVE
Glucose, UA: NEGATIVE
Ketones, UA: NEGATIVE
Leukocytes,UA: NEGATIVE
Nitrite, UA: NEGATIVE
Protein,UA: NEGATIVE
Specific Gravity, UA: 1.03 (ref 1.005–1.030)
Urobilinogen, Ur: 0.2 mg/dL (ref 0.2–1.0)
pH, UA: 5 (ref 5.0–7.5)

## 2022-07-16 LAB — MICROSCOPIC EXAMINATION

## 2022-07-16 NOTE — Addendum Note (Signed)
Addended by: Gerald Leitz A on: 07/16/2022 12:05 PM   Modules accepted: Orders

## 2022-07-16 NOTE — Progress Notes (Signed)
[facility] Posting Form   Surgery Date/Time: Date: 07/22/2022  Surgeon: Dr. John Giovanni, MD  Surgery Location: Day Surgery  Inpt ( No  )   Outpt (Yes)   Obs ( No  )   Diagnosis: N20.1 Right Ureteral Stone  -CPT: [phone]  Surgery: Right Ureteroscopy with laser lithotripsy and stent placement  Stop Anticoagulations: N/A  Cardiac/Medical/Pulmonary Clearance needed: no  *Orders entered into EPIC  Date: 07/16/22   *Case booked in EPIC  Date: 07/16/22  *Notified pt of Surgery: Date: 07/16/22  PRE-OP UA & CX: yes, will obtain in clinic today 07/16/2022  *Placed into Prior Authorization Work Fabio Bering Date: 07/16/22   Assistant/laser/rep:No

## 2022-07-16 NOTE — Telephone Encounter (Signed)
I spoke with Brandon [name]. We have discussed possible surgery dates and Tuesday September 19th, 2023 was agreed upon by all parties. Patient given information about surgery date, what to expect pre-operatively and post operatively.  We discussed that a Pre-Admission Testing office will be calling to set up the pre-op visit that will take place prior to surgery, and that these appointments are typically done over the phone with a Pre-Admissions RN.  Informed patient that our office will communicate any additional care to be provided after surgery. Patients questions or concerns were discussed during our call. Advised to call our office should there be any additional information, questions or concerns that arise. Patient verbalized understanding.

## 2022-07-18 ENCOUNTER — Encounter
Admission: RE | Admit: 2022-07-18 | Discharge: 2022-07-18 | Disposition: A | Discharge status: Home / Self Care | Payer: 59 | Source: Ambulatory Visit | Attending: Urology | Admitting: Urology

## 2022-07-18 VITALS — Ht 71.0 in | Wt 322.0 lb

## 2022-07-18 DIAGNOSIS — I480 Paroxysmal atrial fibrillation: Secondary | ICD-10-CM

## 2022-07-18 DIAGNOSIS — I10 Essential (primary) hypertension: Secondary | ICD-10-CM

## 2022-07-18 DIAGNOSIS — Z01818 Encounter for other preprocedural examination: Secondary | ICD-10-CM | POA: Diagnosis not present

## 2022-07-18 DIAGNOSIS — Z01810 Encounter for preprocedural cardiovascular examination: Secondary | ICD-10-CM | POA: Insufficient documentation

## 2022-07-18 DIAGNOSIS — Z01812 Encounter for preprocedural laboratory examination: Secondary | ICD-10-CM

## 2022-07-18 HISTORY — DX: Morbid (severe) obesity due to excess calories: E66.01

## 2022-07-18 HISTORY — DX: Personal history of urinary calculi: Z87.442

## 2022-07-18 NOTE — Patient Instructions (Addendum)
Your procedure is scheduled on: Tuesday, September 19 Report to the Registration Desk on the 1st floor of the [facility]. To find out your arrival time, please call [phone] between 1PM - 3PM on: Monday, September 18 If your arrival time is 6:00 am, do not arrive prior to that time as the [place] entrance doors do not open until 6:00 am.  REMEMBER: Instructions that are not followed completely may result in serious medical risk, up to and including death; or upon the discretion of your surgeon and anesthesiologist your surgery may need to be rescheduled.  Do not eat or drink after midnight the night before surgery.  No gum chewing, lozengers or hard candies.  TAKE THESE MEDICATIONS THE MORNING OF SURGERY WITH A SIP OF WATER:  Diltiazem Flecainide Oxycodone if needed for pain  One week prior to surgery: starting today, September 15 Stop Anti-inflammatories (NSAIDS) such as Advil, Aleve, Ibuprofen, Motrin, Naproxen, Naprosyn and Aspirin based products such as Excedrin, Goodys Powder, BC Powder. Stop ANY OVER THE COUNTER supplements until after surgery. Stop vitamin C, vitamin D, turmeric You may however, continue to take Tylenol if needed for pain up until the day of surgery.  No Alcohol for 24 hours before or after surgery.  No Smoking including e-cigarettes for 24 hours prior to surgery.  No chewable tobacco products for at least 6 hours prior to surgery.  No nicotine patches on the day of surgery.  Do not use any "recreational" drugs for at least a week prior to your surgery.  Please be advised that the combination of cocaine and anesthesia may have negative outcomes, up to and including death. If you test positive for cocaine, your surgery will be cancelled.  On the morning of surgery brush your teeth with toothpaste and water, you may rinse your mouth with mouthwash if you wish. Do not swallow any toothpaste or mouthwash.  Do not wear jewelry.  Do not wear  lotions, powders, or perfumes.   Do not shave body from the neck down 48 hours prior to surgery just in case you cut yourself which could leave a site for infection.   Contact lenses, hearing aids and dentures may not be worn into surgery.  Do not bring valuables to the hospital. [facility] is not responsible for any missing/lost belongings or valuables.   Notify your doctor if there is any change in your medical condition (cold, fever, infection).  Wear comfortable clothing (specific to your surgery type) to the hospital.  After surgery, you can help prevent lung complications by doing breathing exercises.  Take deep breaths and cough every 1-2 hours. Your doctor may order a device called an Incentive Spirometer to help you take deep breaths.  If you are being discharged the day of surgery, you will not be allowed to drive home. You will need a responsible adult (18 years or older) to drive you home and stay with you that night.   If you are taking public transportation, you will need to have a responsible adult (18 years or older) with you. Please confirm with your physician that it is acceptable to use public transportation.   Please call the [place] [facility] at [phone] if you have any questions about these instructions.  Surgery Visitation Policy:  Patients undergoing a surgery or procedure may have two family members or support persons with them as long as the person is not COVID-19 positive or experiencing its symptoms.

## 2022-07-19 LAB — CULTURE, URINE COMPREHENSIVE

## 2022-07-22 ENCOUNTER — Ambulatory Visit: Payer: 59

## 2022-07-22 ENCOUNTER — Ambulatory Visit: Payer: 59 | Admitting: Urgent Care

## 2022-07-22 ENCOUNTER — Encounter
Admission: RE | Disposition: A | Discharge status: Home / Self Care | Payer: Self-pay | Source: Home / Self Care | Attending: Urology

## 2022-07-22 ENCOUNTER — Encounter: Payer: Self-pay | Admitting: Urology

## 2022-07-22 ENCOUNTER — Ambulatory Visit
Admission: RE | Admit: 2022-07-22 | Discharge: 2022-07-22 | Disposition: A | Discharge status: Home / Self Care | Payer: 59 | Attending: Urology | Admitting: Urology

## 2022-07-22 DIAGNOSIS — I493 Ventricular premature depolarization: Secondary | ICD-10-CM | POA: Diagnosis not present

## 2022-07-22 DIAGNOSIS — M1990 Unspecified osteoarthritis, unspecified site: Secondary | ICD-10-CM | POA: Diagnosis not present

## 2022-07-22 DIAGNOSIS — N400 Enlarged prostate without lower urinary tract symptoms: Secondary | ICD-10-CM | POA: Insufficient documentation

## 2022-07-22 DIAGNOSIS — I10 Essential (primary) hypertension: Secondary | ICD-10-CM | POA: Diagnosis not present

## 2022-07-22 DIAGNOSIS — I2510 Atherosclerotic heart disease of native coronary artery without angina pectoris: Secondary | ICD-10-CM | POA: Insufficient documentation

## 2022-07-22 DIAGNOSIS — Z6841 Body Mass Index (BMI) 40.0 and over, adult: Secondary | ICD-10-CM | POA: Diagnosis not present

## 2022-07-22 DIAGNOSIS — E669 Obesity, unspecified: Secondary | ICD-10-CM | POA: Insufficient documentation

## 2022-07-22 DIAGNOSIS — N201 Calculus of ureter: Secondary | ICD-10-CM | POA: Insufficient documentation

## 2022-07-22 DIAGNOSIS — I480 Paroxysmal atrial fibrillation: Secondary | ICD-10-CM | POA: Insufficient documentation

## 2022-07-22 DIAGNOSIS — N3289 Other specified disorders of bladder: Secondary | ICD-10-CM | POA: Diagnosis not present

## 2022-07-22 HISTORY — PX: CYSTOSCOPY/URETEROSCOPY/HOLMIUM LASER/STENT PLACEMENT: SHX6546

## 2022-07-22 SURGERY — CYSTOSCOPY/URETEROSCOPY/HOLMIUM LASER/STENT PLACEMENT
Anesthesia: General | Site: Ureter | Laterality: Right

## 2022-07-22 MED ORDER — OXYCODONE HCL 5 MG PO TABS: 5.0000 mg | ORAL_TABLET | Freq: Three times a day (TID) | ORAL | 0 refills | Status: DC | PRN

## 2022-07-22 MED ORDER — CHLORHEXIDINE GLUCONATE 0.12 % MT SOLN: 15.0000 mL | Freq: Once | OROMUCOSAL | Status: AC

## 2022-07-22 MED ORDER — MIDAZOLAM HCL 2 MG/2ML IJ SOLN
INTRAMUSCULAR | Status: DC | PRN
  Administered 2022-07-22: 2 mg via INTRAVENOUS

## 2022-07-22 MED ORDER — ONDANSETRON HCL 4 MG/2ML IJ SOLN: 4.0000 mg | Freq: Once | INTRAMUSCULAR | Status: DC | PRN

## 2022-07-22 MED ORDER — ACETAMINOPHEN 10 MG/ML IV SOLN
INTRAVENOUS | Status: AC
  Filled 2022-07-22: qty 100

## 2022-07-22 MED ORDER — MIDAZOLAM HCL 2 MG/2ML IJ SOLN
INTRAMUSCULAR | Status: AC
  Filled 2022-07-22: qty 2

## 2022-07-22 MED ORDER — FAMOTIDINE 20 MG PO TABS
ORAL_TABLET | ORAL | Status: AC
  Filled 2022-07-22: qty 1

## 2022-07-22 MED ORDER — OXYCODONE HCL 5 MG/5ML PO SOLN: 5.0000 mg | Freq: Once | ORAL | Status: DC | PRN

## 2022-07-22 MED ORDER — ORAL CARE MOUTH RINSE: 15.0000 mL | Freq: Once | OROMUCOSAL | Status: AC

## 2022-07-22 MED ORDER — PROPOFOL 10 MG/ML IV BOLUS
INTRAVENOUS | Status: DC | PRN
  Administered 2022-07-22: 30 mg via INTRAVENOUS
  Administered 2022-07-22: 200 mg via INTRAVENOUS
  Administered 2022-07-22: 30 mg via INTRAVENOUS

## 2022-07-22 MED ORDER — CEFAZOLIN SODIUM-DEXTROSE 2-4 GM/100ML-% IV SOLN
INTRAVENOUS | Status: AC
  Filled 2022-07-22: qty 100

## 2022-07-22 MED ORDER — DEXAMETHASONE SODIUM PHOSPHATE 10 MG/ML IJ SOLN
INTRAMUSCULAR | Status: DC | PRN
  Administered 2022-07-22: 8 mg via INTRAVENOUS

## 2022-07-22 MED ORDER — FENTANYL CITRATE (PF) 100 MCG/2ML IJ SOLN: 25.0000 ug | INTRAMUSCULAR | Status: DC | PRN

## 2022-07-22 MED ORDER — FENTANYL CITRATE (PF) 100 MCG/2ML IJ SOLN
INTRAMUSCULAR | Status: AC
  Filled 2022-07-22: qty 2

## 2022-07-22 MED ORDER — EPHEDRINE SULFATE (PRESSORS) 50 MG/ML IJ SOLN
INTRAMUSCULAR | Status: DC | PRN
  Administered 2022-07-22 (×2): 10 mg via INTRAVENOUS
  Administered 2022-07-22: 5 mg via INTRAVENOUS

## 2022-07-22 MED ORDER — OXYCODONE HCL 5 MG PO TABS: 5.0000 mg | ORAL_TABLET | Freq: Once | ORAL | Status: DC | PRN

## 2022-07-22 MED ORDER — ONDANSETRON HCL 4 MG/2ML IJ SOLN
INTRAMUSCULAR | Status: DC | PRN
  Administered 2022-07-22: 4 mg via INTRAVENOUS

## 2022-07-22 MED ORDER — OXYBUTYNIN CHLORIDE 5 MG PO TABS: ORAL_TABLET | ORAL | 0 refills | Status: DC

## 2022-07-22 MED ORDER — PHENYLEPHRINE HCL (PRESSORS) 10 MG/ML IV SOLN
INTRAVENOUS | Status: DC | PRN
  Administered 2022-07-22 (×8): 160 ug via INTRAVENOUS

## 2022-07-22 MED ORDER — SODIUM CHLORIDE 0.9 % IR SOLN
Status: DC | PRN
  Administered 2022-07-22: 3000 mL via INTRAVESICAL

## 2022-07-22 MED ORDER — CHLORHEXIDINE GLUCONATE 0.12 % MT SOLN
OROMUCOSAL | Status: AC
  Administered 2022-07-22: 15 mL via OROMUCOSAL
  Filled 2022-07-22: qty 15

## 2022-07-22 MED ORDER — FENTANYL CITRATE (PF) 100 MCG/2ML IJ SOLN
INTRAMUSCULAR | Status: DC | PRN
  Administered 2022-07-22 (×2): 50 ug via INTRAVENOUS

## 2022-07-22 MED ORDER — LACTATED RINGERS IV SOLN: INTRAVENOUS | Status: DC

## 2022-07-22 MED ORDER — KETOROLAC TROMETHAMINE 30 MG/ML IJ SOLN
INTRAMUSCULAR | Status: DC | PRN
  Administered 2022-07-22: 15 mg via INTRAVENOUS

## 2022-07-22 MED ORDER — ACETAMINOPHEN 10 MG/ML IV SOLN
INTRAVENOUS | Status: DC | PRN
  Administered 2022-07-22: 1000 mg via INTRAVENOUS

## 2022-07-22 MED ORDER — CIPROFLOXACIN IN D5W 400 MG/200ML IV SOLN
400.0000 mg | INTRAVENOUS | Status: AC
  Administered 2022-07-22: 400 mg via INTRAVENOUS

## 2022-07-22 MED ORDER — FAMOTIDINE 20 MG PO TABS
20.0000 mg | ORAL_TABLET | Freq: Once | ORAL | Status: AC
  Administered 2022-07-22: 20 mg via ORAL

## 2022-07-22 MED ORDER — CIPROFLOXACIN IN D5W 400 MG/200ML IV SOLN
INTRAVENOUS | Status: AC
  Filled 2022-07-22: qty 200

## 2022-07-22 MED ORDER — LIDOCAINE HCL (CARDIAC) PF 100 MG/5ML IV SOSY
PREFILLED_SYRINGE | INTRAVENOUS | Status: DC | PRN
  Administered 2022-07-22: 100 mg via INTRAVENOUS

## 2022-07-22 MED ORDER — SUCCINYLCHOLINE CHLORIDE 200 MG/10ML IV SOSY
PREFILLED_SYRINGE | INTRAVENOUS | Status: DC | PRN
  Administered 2022-07-22: 100 mg via INTRAVENOUS

## 2022-07-22 MED ORDER — PROPOFOL 10 MG/ML IV BOLUS
INTRAVENOUS | Status: AC
  Filled 2022-07-22: qty 40

## 2022-07-22 MED ORDER — IOHEXOL 180 MG/ML  SOLN
INTRAMUSCULAR | Status: DC | PRN
  Administered 2022-07-22: 10 mL

## 2022-07-22 MED ORDER — ACETAMINOPHEN 10 MG/ML IV SOLN: 1000.0000 mg | Freq: Once | INTRAVENOUS | Status: DC | PRN

## 2022-07-22 MED ORDER — TAMSULOSIN HCL 0.4 MG PO CAPS: 0.4000 mg | ORAL_CAPSULE | Freq: Every day | ORAL | 0 refills | Status: DC

## 2022-07-22 SURGICAL SUPPLY — 33 items
ADH LQ OCL WTPRF AMP STRL LF (MISCELLANEOUS) ×1
ADHESIVE MASTISOL STRL (MISCELLANEOUS) IMPLANT
BAG DRAIN SIEMENS DORNER NS (MISCELLANEOUS) ×1 IMPLANT
BAG DRN NS LF (MISCELLANEOUS) ×1
BASKET ZERO TIP 1.9FR (BASKET) IMPLANT
BRUSH SCRUB EZ 1% IODOPHOR (MISCELLANEOUS) ×1 IMPLANT
BSKT STON RTRVL ZERO TP 1.9FR (BASKET) ×1
CATH URET FLEX-TIP 2 LUMEN 10F (CATHETERS) IMPLANT
CATH URETL OPEN END 6X70 (CATHETERS) IMPLANT
CNTNR SPEC 2.5X3XGRAD LEK (MISCELLANEOUS)
CONT SPEC 4OZ STER OR WHT (MISCELLANEOUS)
CONT SPEC 4OZ STRL OR WHT (MISCELLANEOUS)
CONTAINER SPEC 2.5X3XGRAD LEK (MISCELLANEOUS) IMPLANT
DRAPE UTILITY 15X26 TOWEL STRL (DRAPES) ×1 IMPLANT
DRSG TEGADERM 2-3/8X2-3/4 SM (GAUZE/BANDAGES/DRESSINGS) IMPLANT
FIBER LASER MOSES 200 DFL (Laser) IMPLANT
GLOVE SURG UNDER POLY LF SZ7.5 (GLOVE) ×1 IMPLANT
GOWN STRL REUS W/ TWL LRG LVL3 (GOWN DISPOSABLE) ×1 IMPLANT
GOWN STRL REUS W/ TWL XL LVL3 (GOWN DISPOSABLE) ×1 IMPLANT
GOWN STRL REUS W/TWL LRG LVL3 (GOWN DISPOSABLE) ×1
GOWN STRL REUS W/TWL XL LVL3 (GOWN DISPOSABLE) ×1
GUIDEWIRE STR DUAL SENSOR (WIRE) ×1 IMPLANT
IV NS IRRIG 3000ML ARTHROMATIC (IV SOLUTION) ×1 IMPLANT
KIT TURNOVER CYSTO (KITS) ×1 IMPLANT
PACK CYSTO AR (MISCELLANEOUS) ×1 IMPLANT
SET CYSTO W/LG BORE CLAMP LF (SET/KITS/TRAYS/PACK) ×1 IMPLANT
SHEATH NAVIGATOR HD 12/14X36 (SHEATH) IMPLANT
STENT URET 6FRX24 CONTOUR (STENTS) IMPLANT
STENT URET 6FRX26 CONTOUR (STENTS) IMPLANT
SURGILUBE 2OZ TUBE FLIPTOP (MISCELLANEOUS) ×1 IMPLANT
TRAP FLUID SMOKE EVACUATOR (MISCELLANEOUS) ×1 IMPLANT
VALVE UROSEAL ADJ ENDO (VALVE) IMPLANT
WATER STERILE IRR 500ML POUR (IV SOLUTION) ×1 IMPLANT

## 2022-07-22 NOTE — Op Note (Signed)
Preoperative diagnosis:  Right distal ureteral calculus  Postoperative diagnosis:  Right distal ureteral calculus  Procedure:  Cystoscopy Right ureteroscopy and stone removal Ureteroscopic laser lithotripsy Right ureteral stent placement (35F/26 cm)  Right retrograde pyelography with interpretation  Surgeon: Nicki Reaper C. Armenta Erskin, M.D.  Anesthesia: General  Complications: None  Intraoperative findings:  Cystoscopy-urethra normal in caliber without stricture.  Prostate with mild-moderate lateral lobe enlargement and mild bladder neck elevation.  Mild bladder trabeculation present.  No solid or papillary lesions.  UOs normal-appearing bilaterally Right ureteroscopy-free-floating calculus right distal ureter Right retrograde pyelography post procedure showed no filling defects, stone fragments or contrast extravasation  EBL: Minimal  Specimens: Calculus fragments for analysis   Indication: Brandon [name] is a 56 y.o. with a 5 mm right distal ureteral calculus who initially elected medical expulsion therapy.  He has had none progression of his calculus and requested ureteroscopic removal. After reviewing the management options for treatment, the patient elected to proceed with the above surgical procedure(s). We have discussed the potential benefits and risks of the procedure, side effects of the proposed treatment, the likelihood of the patient achieving the goals of the procedure, and any potential problems that might occur during the procedure or recuperation. Informed consent has been obtained.  Description of procedure:  The patient was taken to the operating room and general anesthesia was induced.  The patient was placed in the dorsal lithotomy position, prepped and draped in the usual sterile fashion, and preoperative antibiotics were administered. A preoperative time-out was performed.   A 21 French cystoscope was lubricated, passed per urethra and advanced proximally under  direct vision into the bladder with findings as described above.    Attention was directed to the right ureteral orifice and a 0.038 Sensor wire was then advanced up the ureter into the renal pelvis under fluoroscopic guidance.  A 4.5 Fr semirigid ureteroscope was then advanced into the ureter next to the guidewire and the calculus was identified as described above.  The stone was then dusted with a 200 micron holmium laser fiber on a setting of 0.3/40 Hz.  All fragments >1 mm in size were then removed from the ureter with a zero tip nitinol basket.  Reinspection of the ureter revealed no remaining visible stones or fragments.   Retrograde pyelogram was performed with findings as described above.  A 35F/26 cm Contour ureteral stent was placed under fluoroscopic guidance.  The wire was then removed with an adequate stent curl noted in the renal pelvis as well as in the bladder.  The bladder was then emptied and the procedure ended.  The patient appeared to tolerate the procedure well and without complications.  After anesthetic reversal the patient was transported to the PACU in stable condition.   Plan: Patient was instructed to remove his stent via the tether on Friday, 07/25/2022 Postop f/u 1 month   John Giovanni, MD

## 2022-07-22 NOTE — H&P (Signed)
Urology H&P  Chief Complaint: Kidney stone  History of Present Illness: Brandon [name] is a 56 y.o. male with a 5 mm right UVJ calculus.  Continues with intermittent symptoms and has requested ureteroscopic removal.  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) 04/28/2018   Ventricular ectopy     Past Surgical History:  Procedure Laterality Date   CARDIAC CATHETERIZATION     COLONOSCOPY  05/2014   COLONOSCOPY WITH PROPOFOL N/A 04/20/2018   Procedure: COLONOSCOPY WITH PROPOFOL;  Surgeon: Lollie Sails, MD;  Location: [facility] ENDOSCOPY;  Service: Endoscopy;  Laterality: N/A;   COLONOSCOPY WITH PROPOFOL N/A 06/20/2019   Procedure: COLONOSCOPY WITH PROPOFOL;  Surgeon: Lollie Sails, MD;  Location: [facility] ENDOSCOPY;  Service: Endoscopy;  Laterality: N/A;   FOOT SURGERY     left foot   SHOULDER ARTHROSCOPY WITH SUBACROMIAL DECOMPRESSION AND BICEP TENDON REPAIR Right 12/14/2018   Procedure: SHOULDER ARTHROSCOPY WITH EBRIDEMENT, DECOMPRESSION, DISTAL CLAVICLE EXCISION, BICEP TENODESIS AND DEBRIDEMENT, AND REPAIR OF ROTATOR CUFF TEAR;  Surgeon: Corky Mull, MD;  Location: [facility] ORS;  Service: Orthopedics;  Laterality: Right;   SHOULDER ARTHROSCOPY WITH SUBACROMIAL DECOMPRESSION, ROTATOR CUFF REPAIR AND BICEP TENDON REPAIR Left 02/12/2021   Procedure: LEFT SHOULDER ARTHROSCOPY WITH  DEBRIDEMENT, DECOMPRESSION, ROTATOR CUFF REPAIR, BICEPS TENODESIS, AND DISTAL CLAVICLE EXCISION;  Surgeon: Corky Mull, MD;  Location: [facility] ORS;  Service: Orthopedics;  Laterality: Left;   TONSILLECTOMY      Home Medications:  Current Facility-Administered Medications for the 07/22/22 encounter [facility] Encounter)  Medication   0.9 %  sodium chloride infusion   Current Meds  Medication Sig   acetaminophen (TYLENOL) 500 MG tablet Take 1,000 mg by mouth every 6 (six) hours as needed for moderate pain.    cyanocobalamin (VITAMIN B12) 1000 MCG/ML injection Inject 1 mL (1,000 mcg total) into the muscle every 30 (thirty) days.   diazepam (VALIUM) 5 MG tablet TAKE 1 TABLET BY MOUTH EVERY 8 HOURS AS NEEDED FOR ANXIETY.   diltiazem (CARDIZEM CD) 180 MG 24 hr capsule TAKE 1 CAPSULE BY MOUTH EVERY DAY   flecainide (TAMBOCOR) 100 MG tablet TAKE 1 TABLET BY MOUTH 2 (TWO) TIMES DAILY. PLEASE KEEP APPOINTMENT SCHEDULED IN OCTOBER   losartan (COZAAR) 25 MG tablet Take 1 tablet (25 mg total) by mouth daily.   TURMERIC PO Take 1,000 mg by mouth 2 (two) times daily.   vitamin C (ASCORBIC ACID) 500 MG tablet Take 500 mg by mouth 2 (two) times daily.   Vitamin D, Cholecalciferol, 10 MCG (400 UNIT) CAPS Take 400 Units by mouth 2 (two) times daily.    Allergies:  Allergies  Allergen Reactions   Biaxin [Clarithromycin] Other (See Comments)    Pt has history of arrhythmias   Penicillins Rash    Did it involve swelling of the face/tongue/throat, SOB, or low BP?  Unknown Did it involve sudden or severe rash/hives, skin peeling, or any reaction on the inside of your mouth or nose? Unknown Did you need to seek medical attention at a hospital or doctor's office? Unknown When did it last happen?      childhood allergy If all above answers are "NO", may proceed with cephalosporin use.     Family History  Problem Relation Age of Onset   Colon polyps Mother    Stroke Father    Arthritis Other    Diabetes  Other        1st degree relative   Stroke Other        F 1st degree relative <60; M 1st degree relative <50   Colon cancer Neg Hx    Stomach cancer Neg Hx    Esophageal cancer Neg Hx     Social History:  reports that he has never smoked. He has quit using smokeless tobacco.  His smokeless tobacco use included snuff. He reports that he does not currently use alcohol. He reports that he does not use drugs.  ROS: A complete review of systems was performed.  All systems are negative except for pertinent findings as noted.  Physical Exam:  Vital signs in last 24 hours: Temp:  [97.7 F (36.5 C)] 97.7 F (36.5 C) (09/19 1145) Pulse Rate:  [71] 71 (09/19 1145) Resp:  [20] 20 (09/19 1145) BP: (151)/(91) 151/91 (09/19 1145) SpO2:  [99 %] 99 % (09/19 1145) Weight:  [146.1 kg] 146.1 kg (09/19 1145) Constitutional:  Alert and oriented, No acute distress HEENT: [place] AT Cardiovascular: Regular rate and rhythm Respiratory: Normal respiratory effort, lungs clear bilaterally Psychiatric: Normal mood and affect   Laboratory Data:  No results for input(s): "WBC", "HGB", "HCT" in the last 72 hours. No results for input(s): "NA", "K", "CL", "CO2", "GLUCOSE", "BUN", "CREATININE", "CALCIUM" in the last 72 hours. No results for input(s): "LABPT", "INR" in the last 72 hours. No results for input(s): "LABURIN" in the last 72 hours. Results for orders placed or performed in visit on 07/16/22  CULTURE, URINE COMPREHENSIVE     Status: None   Collection Time: 07/16/22  3:01 PM   Specimen: Urine   UR  Result Value Ref Range Status   Urine Culture, Comprehensive Final report  Final   Organism ID, Bacteria Comment  Final    Comment: Mixed urogenital flora 2,000 Colonies/mL   Microscopic Examination     Status: Abnormal   Collection Time: 07/16/22  3:01 PM   Urine  Result Value Ref Range Status   WBC, UA 0-5 0 - 5 /hpf Final   RBC, Urine 0-2 0 - 2 /hpf Final   Epithelial Cells (non renal) 0-10 0 - 10  /hpf Final   Casts Present (A) None seen /lpf Final   Cast Type Hyaline casts N/A Final    Comment: Granular casts   Mucus, UA Present (A) Not Estab. Final   Bacteria, UA Few None seen/Few Final     Impression/Plan: 56 y.o. male with a 5 mm right UVJ calculus which has failed to progress.  He has requested ureteroscopic removal.  The procedure has been discussed in detail including potential risks which have been outlined in prior office notes.  The need for a ureteral stent postoperatively was discussed.  All questions were answered and he desires to proceed.   07/22/2022, 12:14 PM  John Giovanni,  MD

## 2022-07-22 NOTE — Interval H&P Note (Signed)
History and Physical Interval Note:  07/22/2022 12:16 PM  Brandon [name]  has presented today for surgery, with the diagnosis of Right Ureteral Stone.  The various methods of treatment have been discussed with the patient and family. After consideration of risks, benefits and other options for treatment, the patient has consented to  Procedure(s): CYSTOSCOPY/URETEROSCOPY/HOLMIUM LASER/STENT PLACEMENT (Right) as a surgical intervention.  The patient's history has been reviewed, patient examined, no change in status, stable for surgery.  I have reviewed the patient's chart and labs.  Questions were answered to the patient's satisfaction.     [place]

## 2022-07-22 NOTE — Transfer of Care (Signed)
Immediate Anesthesia Transfer of Care Note  Patient: Draedyn Weidinger  Procedure(s) Performed: CYSTOSCOPY/URETEROSCOPY/HOLMIUM LASER/STENT PLACEMENT (Right: Ureter)  Patient Location: PACU  Anesthesia Type:General  Level of Consciousness: awake, drowsy and patient cooperative  Airway & Oxygen Therapy: Patient Spontanous Breathing and Patient connected to face mask oxygen  Post-op Assessment: Report given to RN and Post -op Vital signs reviewed and stable  Post vital signs: Reviewed and stable  Last Vitals:  Vitals Value Taken Time  BP 115/60 07/22/22 1401  Temp 35.8 C 07/22/22 1401  Pulse 74 07/22/22 1405  Resp 12 07/22/22 1405  SpO2 98 % 07/22/22 1405  Vitals shown include unvalidated device data.  Last Pain:  Vitals:   07/22/22 1401  TempSrc:   PainSc: Asleep         Complications: No notable events documented.

## 2022-07-22 NOTE — Anesthesia Procedure Notes (Signed)
Procedure Name: Intubation Date/Time: 07/22/2022 1:05 PM  Performed by: Lowry Bowl, CRNAPre-anesthesia Checklist: Patient identified, Emergency Drugs available, Suction available and Patient being monitored Patient Re-evaluated:Patient Re-evaluated prior to induction Oxygen Delivery Method: Circle system utilized Preoxygenation: Pre-oxygenation with 100% oxygen Induction Type: IV induction, Cricoid Pressure applied and Rapid sequence Laryngoscope Size: McGraph and 4 Grade View: Grade I Tube type: Oral Tube size: 7.5 mm Number of attempts: 1 Airway Equipment and Method: Stylet and Video-laryngoscopy Placement Confirmation: ETT inserted through vocal cords under direct vision, positive ETCO2 and breath sounds checked- equal and bilateral Secured at: 22 cm Tube secured with: Tape Dental Injury: Teeth and Oropharynx as per pre-operative assessment

## 2022-07-22 NOTE — Discharge Instructions (Addendum)
AMBULATORY SURGERY  DISCHARGE INSTRUCTIONS   The drugs that you were given will stay in your system until tomorrow so for the next 24 hours you should not:  Drive an automobile Make any legal decisions Drink any alcoholic beverage   You may resume regular meals tomorrow.  Today it is better to start with liquids and gradually work up to solid foods.  You may eat anything you prefer, but it is better to start with liquids, then soup and crackers, and gradually work up to solid foods.   Please notify your doctor immediately if you have any unusual bleeding, trouble breathing, redness and pain at the surgery site, drainage, fever, or pain not relieved by medication.    Your post-operative visit with Dr.                                       is: Date:                        Time:    Please call to schedule your post-operative visit.  Additional Instructions:   DISCHARGE INSTRUCTIONS FOR KIDNEY STONE/URETERAL STENT   MEDICATIONS:  1. Resume all your other meds from home.  2.  AZO (over-the-counter) can help with the burning/stinging when you urinate. 3.  Oxycodone is for moderate/severe pain, Rx was sent to your pharmacy. 4.  Tamsulosin and oxybutynin are for bladder/stent irritation.  Rxs were sent to your pharmacy  ACTIVITY:  1. May resume regular activities in 24 hours. 2. No driving while on narcotic pain medications  3. Drink plenty of water  4. Continue to walk at home - you can still get blood clots when you are at home, so keep active, but don't over do it.  5. May return to work/school tomorrow or when you feel ready   BATHING:  1. You can shower. 2. You have a string coming from your urethra: The stent string is attached to your ureteral stent. Do not pull on this.   SIGNS/SYMPTOMS TO CALL:  Common postoperative symptoms include urinary frequency, urgency, bladder spasm and blood in the urine  Please call us if you have a fever greater than 101.5, uncontrolled  nausea/vomiting, uncontrolled pain, dizziness, unable to urinate, excessively bloody urine, chest pain, shortness of breath, leg swelling, leg pain, or any other concerns or questions.   You can reach [place] at [phone].   FOLLOW-UP:  1. You have a follow-up appointment scheduled 07/31/2022 2. You have a string attached to your stent, you may remove it on Friday morning 07/25/2022. To do this, pull the string until the stent is completely removed. You may feel an odd sensation in your back.

## 2022-07-22 NOTE — Anesthesia Preprocedure Evaluation (Addendum)
Anesthesia Evaluation  Patient identified by MRN, date of birth, ID band Patient awake    Reviewed: Allergy & Precautions, NPO status , Patient's Chart, lab work & pertinent test results  History of Anesthesia Complications Negative for: history of anesthetic complications  Airway Mallampati: III   Neck ROM: Full    Dental  (+) Missing, Chipped   Pulmonary neg pulmonary ROS,    Pulmonary exam normal breath sounds clear to auscultation       Cardiovascular hypertension, Normal cardiovascular exam+ dysrhythmias (hx a fib)  Rhythm:Regular Rate:Normal  ECG 07/18/22:  Normal sinus rhythm Low voltage QRS Borderline ECG When compared with ECG of 03-Dec-2018 13:37, No significant change was found   Neuro/Psych  Headaches, PSYCHIATRIC DISORDERS Depression    GI/Hepatic PUD (ulcerative colitis), GERD  ,  Endo/Other  Class 3 obesity  Renal/GU Renal disease (nephrolithiasis)     Musculoskeletal  (+) Arthritis ,   Abdominal   Peds  Hematology negative hematology ROS (+)   Anesthesia Other Findings Cardiology note 02/07/21:  1. Preoperative cardiac risk stratification: He is scheduled for a left shoulder arthroscopy on 02/12/2021.  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  Per Revised Cardiac Risk Index, he is low risk for noncardiac surgery.  Per [facility] Activity Status Index, he is able to achieve greater than 4 METs without cardiac limitation.  He may proceed with noncardiac surgery at an overall low risk without further cardiac evaluation or testing.  2. 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  No longer on aspirin secondary to ulcerative colitis flares.  Otherwise, he will continue current medical therapy.  No indication for ischemic testing at this time.  3. PAF: Maintaining sinus rhythm on diltiazem and flecainide. CHA2DS2-VASc at least 1  (HTN).  Historically he has declined [place] and continues to do so.  He will be having blood work drawn on 4/8 as part of the preoperative process.  It looks like this will include a CBC and BMP.  I will place a future order for a magnesium level to be drawn at that same time.  Please forward the results to me.    4. HTN: Blood pressure is well controlled in the office.  He remains on Cardizem CD and losartan.  5. PVCs: Quiescent.  He remains on Cardizem CD.  6. Obesity: Weight loss advised.  This was not discussed in today's visit.  7. Ulcerative colitis: Stable without recent exacerbation.  Followed by GI.  Reproductive/Obstetrics                            Anesthesia Physical Anesthesia Plan  ASA: 3  Anesthesia Plan: General   Post-op Pain Management:    Induction: Intravenous  PONV Risk Score and Plan: 2 and Ondansetron, Dexamethasone and Treatment may vary due to age or medical condition  Airway Management Planned: Oral ETT  Additional Equipment:   Intra-op Plan:   Post-operative Plan: Extubation in OR  Informed Consent: I have reviewed the patients History and Physical, chart, labs and discussed the procedure including the risks, benefits and alternatives for the proposed anesthesia with the patient or authorized representative who has indicated his/her understanding and acceptance.     Dental advisory given  Plan Discussed with: CRNA  Anesthesia Plan Comments: (Patient consented for risks of anesthesia including but not limited to:  - adverse reactions to medications - damage to eyes,  teeth, lips or other oral mucosa - nerve damage due to positioning  - sore throat or hoarseness - damage to heart, brain, nerves, lungs, other parts of body or loss of life  Informed patient about role of CRNA in peri- and intra-operative care.  Patient voiced understanding.)        Anesthesia Quick Evaluation

## 2022-07-23 ENCOUNTER — Encounter: Payer: Self-pay | Admitting: Urology

## 2022-07-23 NOTE — Anesthesia Postprocedure Evaluation (Signed)
Anesthesia Post Note  Patient: Makell Cyr  Procedure(s) Performed: CYSTOSCOPY/URETEROSCOPY/HOLMIUM LASER/STENT PLACEMENT (Right: Ureter)  Patient location during evaluation: PACU Anesthesia Type: General Level of consciousness: awake and alert, oriented and patient cooperative Pain management: pain level controlled Vital Signs Assessment: post-procedure vital signs reviewed and stable Respiratory status: spontaneous breathing, nonlabored ventilation and respiratory function stable Cardiovascular status: blood pressure returned to baseline and stable Postop Assessment: adequate PO intake Anesthetic complications: no   No notable events documented.   Last Vitals:  Vitals:   07/22/22 1430 07/22/22 1441  BP: 120/68 138/69  Pulse: 68 68  Resp: 15 16  Temp:  (!) 36.1 C  SpO2: 95% 100%    Last Pain:  Vitals:   07/23/22 0845  TempSrc:   PainSc: 0-No pain                 Darrin Nipper

## 2022-07-30 LAB — CALCULI, WITH PHOTOGRAPH (CLINICAL LAB)
Calcium Oxalate Monohydrate: 100 %
Weight Calculi: 10 mg

## 2022-07-31 ENCOUNTER — Ambulatory Visit (INDEPENDENT_AMBULATORY_CARE_PROVIDER_SITE_OTHER): Payer: 59 | Admitting: Urology

## 2022-07-31 ENCOUNTER — Telehealth: Payer: Self-pay | Admitting: *Deleted

## 2022-07-31 ENCOUNTER — Encounter: Payer: Self-pay | Admitting: Urology

## 2022-07-31 VITALS — BP 140/70 | HR 92 | Ht 74.0 in | Wt 300.0 lb

## 2022-07-31 DIAGNOSIS — N201 Calculus of ureter: Secondary | ICD-10-CM

## 2022-07-31 DIAGNOSIS — E278 Other specified disorders of adrenal gland: Secondary | ICD-10-CM

## 2022-07-31 DIAGNOSIS — N2889 Other specified disorders of kidney and ureter: Secondary | ICD-10-CM

## 2022-07-31 DIAGNOSIS — Z87442 Personal history of urinary calculi: Secondary | ICD-10-CM

## 2022-07-31 NOTE — Telephone Encounter (Signed)
-----   Message from Abbie Sons, MD sent at 07/31/2022 12:02 PM EDT ----- Regarding: Referral Please let patient know I reviewed CT after he left the office this morning.  He has a different type of adrenal mass on the left kidney and recommend an evaluation by endocrinology to see if this mass is producing hormones.  The endocrinologists in [place] are at [facility].  There are [facility] endocrinologist in [place].  I went ahead and put in a referral to [place] clinic.  If he prefers a different practice can change.  Thanks

## 2022-07-31 NOTE — Progress Notes (Signed)
07/31/2022 11:46 AM   Brandon [name] 12/25/1965 [id number]  Referring provider: Owens Loffler, MD [place],  [place] [address]  Chief Complaint  Patient presents with   Routine Post Op    HPI: 56 y.o. male presents for postop follow-up.  s/p ureteroscopic removal of 5 mm right distal ureteral calculus 07/22/2022 Stent removed via tether 07/25/2022.  He had mild flank pain for 24 hours after stent removal which subsequently resolved.  He has no complaints today No prior history of stone disease.  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  No functional evaluation has been performed Stone analysis 100% CaOxMono   PMH: 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   Foraminal stenosis of cervical region    GERD (gastroesophageal reflux disease)    History of kidney stones    Hypertension    Morbid obesity with BMI of 40.0-44.9, adult ([place])    Ulcerative colitis ([place]) 04/28/2018   Ventricular ectopy     Surgical History: Past Surgical History:  Procedure Laterality Date   CARDIAC CATHETERIZATION     COLONOSCOPY  05/2014   COLONOSCOPY WITH PROPOFOL N/A 04/20/2018   Procedure: COLONOSCOPY WITH PROPOFOL;  Surgeon: Lollie Sails, MD;  Location: [facility] ENDOSCOPY;  Service: Endoscopy;  Laterality: N/A;   COLONOSCOPY WITH PROPOFOL N/A 06/20/2019   Procedure: COLONOSCOPY WITH PROPOFOL;  Surgeon: Lollie Sails, MD;  Location: [facility] ENDOSCOPY;  Service:  Endoscopy;  Laterality: N/A;   CYSTOSCOPY/URETEROSCOPY/HOLMIUM LASER/STENT PLACEMENT Right 07/22/2022   Procedure: CYSTOSCOPY/URETEROSCOPY/HOLMIUM LASER/STENT PLACEMENT;  Surgeon: Abbie Sons, MD;  Location: [facility] ORS;  Service: Urology;  Laterality: Right;   FOOT SURGERY     left foot   SHOULDER ARTHROSCOPY WITH SUBACROMIAL DECOMPRESSION AND BICEP TENDON REPAIR Right 12/14/2018   Procedure: SHOULDER ARTHROSCOPY WITH EBRIDEMENT, DECOMPRESSION, DISTAL CLAVICLE EXCISION, BICEP TENODESIS AND DEBRIDEMENT, AND REPAIR OF ROTATOR CUFF TEAR;  Surgeon: Corky Mull, MD;  Location: [facility] ORS;  Service: Orthopedics;  Laterality: Right;   SHOULDER ARTHROSCOPY WITH SUBACROMIAL DECOMPRESSION, ROTATOR CUFF REPAIR AND BICEP TENDON REPAIR Left 02/12/2021   Procedure: LEFT SHOULDER ARTHROSCOPY WITH DEBRIDEMENT, DECOMPRESSION, ROTATOR CUFF REPAIR, BICEPS TENODESIS, AND DISTAL CLAVICLE EXCISION;  Surgeon: Corky Mull, MD;  Location: [facility] ORS;  Service: Orthopedics;  Laterality: Left;   TONSILLECTOMY      Home Medications:  Allergies as of 07/31/2022       Reactions   Biaxin [clarithromycin] Other (See Comments)   Pt has history of arrhythmias   Penicillins Rash   Did it involve swelling of the face/tongue/throat, SOB, or low BP? Unknown Did it involve sudden or severe rash/hives, skin peeling, or any reaction on the inside of your mouth or nose? Unknown Did you need to seek medical attention at a hospital or doctor's office? Unknown When did it last happen?      childhood allergy If all above answers are "NO", may proceed with cephalosporin use.  Medication List        Accurate as of July 31, 2022 11:46 AM. If you have any questions, ask your nurse or doctor.          acetaminophen 500 MG tablet Commonly known as: TYLENOL Take 1,000 mg by mouth every 6 (six) hours as needed for moderate pain.   ascorbic acid 500 MG tablet Commonly known as: VITAMIN C Take 500 mg by mouth 2  (two) times daily.   B-D 3CC LUER-LOK SYR 22GX1" 22G X 1" 3 ML Misc Generic drug: SYRINGE-NEEDLE (DISP) 3 ML USE TO INJECT B 12 INJECTION   cyanocobalamin 1000 MCG/ML injection Commonly known as: VITAMIN B12 Inject 1 mL (1,000 mcg total) into the muscle every 30 (thirty) days.   diazepam 5 MG tablet Commonly known as: VALIUM TAKE 1 TABLET BY MOUTH EVERY 8 HOURS AS NEEDED FOR ANXIETY.   diltiazem 180 MG 24 hr capsule Commonly known as: CARDIZEM CD TAKE 1 CAPSULE BY MOUTH EVERY DAY   flecainide 100 MG tablet Commonly known as: TAMBOCOR TAKE 1 TABLET BY MOUTH 2 (TWO) TIMES DAILY. PLEASE KEEP APPOINTMENT SCHEDULED IN OCTOBER   losartan 25 MG tablet Commonly known as: COZAAR Take 1 tablet (25 mg total) by mouth daily.   oxybutynin 5 MG tablet Commonly known as: DITROPAN 1 tab tid prn frequency,urgency, bladder spasm   oxyCODONE 5 MG immediate release tablet Commonly known as: Roxicodone Take 1 tablet (5 mg total) by mouth every 8 (eight) hours as needed.   tamsulosin 0.4 MG Caps capsule Commonly known as: FLOMAX Take 1 capsule (0.4 mg total) by mouth daily after breakfast.   TURMERIC PO Take 1,000 mg by mouth 2 (two) times daily.   vedolizumab 300 MG injection Commonly known as: ENTYVIO Inject 300 mg into the vein every 8 (eight) weeks.   Vitamin D (Cholecalciferol) 10 MCG (400 UNIT) Caps Take 400 Units by mouth 2 (two) times daily.        Allergies:  Allergies  Allergen Reactions   Biaxin [Clarithromycin] Other (See Comments)    Pt has history of arrhythmias   Penicillins Rash    Did it involve swelling of the face/tongue/throat, SOB, or low BP? Unknown Did it involve sudden or severe rash/hives, skin peeling, or any reaction on the inside of your mouth or nose? Unknown Did you need to seek medical attention at a hospital or doctor's office? Unknown When did it last happen?      childhood allergy If all above answers are "NO", may proceed with cephalosporin  use.     Family History: Family History  Problem Relation Age of Onset   Colon polyps Mother    Stroke Father    Arthritis Other    Diabetes Other        1st degree relative   Stroke Other        F 1st degree relative <60; M 1st degree relative <50   Colon cancer Neg Hx    Stomach cancer Neg Hx    Esophageal cancer Neg Hx     Social History:  reports that he has never smoked. He has quit using smokeless tobacco.  His smokeless tobacco use included snuff. He reports that he does not currently use alcohol. He reports that he does not use drugs.   Physical Exam: BP (!) 140/70   Pulse 92   Ht 6' 2"  (1.88 m)   Wt 300 lb (136.1 kg)   BMI 38.52 kg/m   Constitutional:  Alert and oriented, No acute distress. HEENT: [place] AT Respiratory: Normal respiratory effort, no increased work of breathing. Psychiatric: Normal mood and affect.   Assessment & Plan:    1.  Status post right ureteroscopic stone removal Doing well We discussed general stone prevention guidelines and he was provided literature.  We discussed increasing water intake to keep urine output >2.5 L/day Follow-up with [facility]  2.  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  Endocrinology referral placed With size >4 cm management options will be discussed after functional evaluation   Abbie Sons, MD  [facility] [address], [place], [place] [address] [phone]

## 2022-08-02 NOTE — Progress Notes (Unsigned)
Cardiology Office Note  Date:  08/02/2022   ID:  Brandon [name], DOB 1966/09/26, MRN [id number]  PCP:  Owens Loffler, MD   CC: atrial fib f/u  HPI:  Brandon [name] is a 56 -year-old gentleman with history of  hypertension,  obesity,  chronic back pain, after traumatic injury, fall atrial fibrillation , previously declined anticoagulation Atrial fibrillation likely dating back to before 2012, appreciated fluttering, does not remember circumstances Remote history of PVCs Cardiac catheterization may 2012 showed no significant CAD, normal ejection fraction greater than 55%. who presents for routine followup of his atrial fibrillation, hypertension, morbid obesity.  Last seen in clinic by myself October 2022 Prior history of diarrhea, ulcerative colitis, followed by GI  Weight continues to run high, BMI 43  Left shoulder surgery 02/2021 Long recovery, feels he will get 75% of his mobility and strength back  Coughing today, inhaling corn dust on the farm Reports he used an N95 mask Scheduled to see ENT today  Denies any tachypalpitations concerning for atrial fibrillation Not on anticoagulation secondary to Chads VASC 1 Tolerating diltiazem and flecainide  No regular exercise program  Chronic cervical and lower back pain  Back pain may stem from prior fall off a combine onto metal disc striking mid and lower lumbar spine in October 2017 Chronic pain since that time  He is a non-smoker, no diabetes, cholesterol at goal Blood pressure stable  EKG personally reviewed by myself on todays visit shows normal sinus rhythm rate 81 bpm no significant ST or T-wave changes   Other past medical history reviewed Works on a farm with heavy machinery    several years since his last episode of atrial fibrillation.   Cardiac catheterization may 2012 showed no significant CAD, normal ejection fraction greater than 55%. Cholesterol well controlled without any cluster medication  PMH:    has a past medical history of Allergy, ARTHRITIS (06/07/2009), Arthritis, ATRIAL FIBRILLATION, PAROXYSMAL (06/07/2009), Basal cell carcinoma, Carpal tunnel syndrome of right wrist (03/2019), Cervical disc disorder with radiculopathy of cervical region (10/05/2013), Classical migraine (03/03/2012), Colon polyp, DEPRESSION (06/07/2009), DIVERTICULITIS, COLON (06/07/2009), DJD of AC (acromioclavicular) joint (07/2020), Foraminal stenosis of cervical region, GERD (gastroesophageal reflux disease), History of kidney stones, Hypertension, Morbid obesity with BMI of 40.0-44.9, adult ([place]), Ulcerative colitis ([place]) (04/28/2018), and Ventricular ectopy.  PSH:    Past Surgical History:  Procedure Laterality Date   CARDIAC CATHETERIZATION     COLONOSCOPY  05/2014   COLONOSCOPY WITH PROPOFOL N/A 04/20/2018   Procedure: COLONOSCOPY WITH PROPOFOL;  Surgeon: Lollie Sails, MD;  Location: [facility] ENDOSCOPY;  Service: Endoscopy;  Laterality: N/A;   COLONOSCOPY WITH PROPOFOL N/A 06/20/2019   Procedure: COLONOSCOPY WITH PROPOFOL;  Surgeon: Lollie Sails, MD;  Location: [facility] ENDOSCOPY;  Service: Endoscopy;  Laterality: N/A;   CYSTOSCOPY/URETEROSCOPY/HOLMIUM LASER/STENT PLACEMENT Right 07/22/2022   Procedure: CYSTOSCOPY/URETEROSCOPY/HOLMIUM LASER/STENT PLACEMENT;  Surgeon: Abbie Sons, MD;  Location: [facility] ORS;  Service: Urology;  Laterality: Right;   FOOT SURGERY     left foot   SHOULDER ARTHROSCOPY WITH SUBACROMIAL DECOMPRESSION AND BICEP TENDON REPAIR Right 12/14/2018   Procedure: SHOULDER ARTHROSCOPY WITH EBRIDEMENT, DECOMPRESSION, DISTAL CLAVICLE EXCISION, BICEP TENODESIS AND DEBRIDEMENT, AND REPAIR OF ROTATOR CUFF TEAR;  Surgeon: Corky Mull, MD;  Location: [facility] ORS;  Service: Orthopedics;  Laterality: Right;   SHOULDER ARTHROSCOPY WITH SUBACROMIAL DECOMPRESSION, ROTATOR CUFF REPAIR AND BICEP TENDON REPAIR Left 02/12/2021   Procedure: LEFT SHOULDER ARTHROSCOPY WITH DEBRIDEMENT, DECOMPRESSION, ROTATOR  CUFF REPAIR, BICEPS TENODESIS, AND DISTAL CLAVICLE  EXCISION;  Surgeon: Corky Mull, MD;  Location: [facility] ORS;  Service: Orthopedics;  Laterality: Left;   TONSILLECTOMY      Current Outpatient Medications  Medication Sig Dispense Refill   acetaminophen (TYLENOL) 500 MG tablet Take 1,000 mg by mouth every 6 (six) hours as needed for moderate pain.      cyanocobalamin (VITAMIN B12) 1000 MCG/ML injection Inject 1 mL (1,000 mcg total) into the muscle every 30 (thirty) days. 1 mL 2   diazepam (VALIUM) 5 MG tablet TAKE 1 TABLET BY MOUTH EVERY 8 HOURS AS NEEDED FOR ANXIETY. 30 tablet 0   diltiazem (CARDIZEM CD) 180 MG 24 hr capsule TAKE 1 CAPSULE BY MOUTH EVERY DAY 90 capsule 3   flecainide (TAMBOCOR) 100 MG tablet TAKE 1 TABLET BY MOUTH 2 (TWO) TIMES DAILY. PLEASE KEEP APPOINTMENT SCHEDULED IN OCTOBER 180 tablet 3   losartan (COZAAR) 25 MG tablet Take 1 tablet (25 mg total) by mouth daily. 90 tablet 3   oxybutynin (DITROPAN) 5 MG tablet 1 tab tid prn frequency,urgency, bladder spasm 30 tablet 0   oxyCODONE (ROXICODONE) 5 MG immediate release tablet Take 1 tablet (5 mg total) by mouth every 8 (eight) hours as needed. 8 tablet 0   SYRINGE-NEEDLE, DISP, 3 ML (B-D 3CC LUER-LOK SYR 22GX1") 22G X 1" 3 ML MISC USE TO INJECT B 12 INJECTION     tamsulosin (FLOMAX) 0.4 MG CAPS capsule Take 1 capsule (0.4 mg total) by mouth daily after breakfast. 30 capsule 0   TURMERIC PO Take 1,000 mg by mouth 2 (two) times daily.     vedolizumab (ENTYVIO) 300 MG injection Inject 300 mg into the vein every 8 (eight) weeks. 1 each 0   vitamin C (ASCORBIC ACID) 500 MG tablet Take 500 mg by mouth 2 (two) times daily.     Vitamin D, Cholecalciferol, 10 MCG (400 UNIT) CAPS Take 400 Units by mouth 2 (two) times daily.     No current facility-administered medications for this visit.    Allergies:   Biaxin [clarithromycin] and Penicillins   Social History:  The patient  reports that he has never smoked. He has quit using  smokeless tobacco.  His smokeless tobacco use included snuff. He reports that he does not currently use alcohol. He reports that he does not use drugs.   Family History:   family history includes Arthritis in an other family member; Colon polyps in his mother; Diabetes in an other family member; Stroke in his father and another family member.    Review of Systems: Review of Systems  Constitutional: Negative.   Respiratory: Negative.    Cardiovascular: Negative.   Gastrointestinal: Negative.  Negative for nausea.  Musculoskeletal:  Positive for back pain.       Neck pain, low back pain  Neurological: Negative.   Psychiatric/Behavioral: Negative.    All other systems reviewed and are negative.   PHYSICAL EXAM: VS:  There were no vitals taken for this visit. , BMI There is no height or weight on file to calculate BMI.  Constitutional:  oriented to person, place, and time. No distress.  HENT:  Head: Grossly normal Eyes:  no discharge. No scleral icterus.  Neck: No JVD, no carotid bruits  Cardiovascular: Regular rate and rhythm, no murmurs appreciated Pulmonary/Chest: Clear to auscultation bilaterally, no wheezes or rails Abdominal: Soft.  no distension.  no tenderness.  Musculoskeletal: Normal range of motion Neurological:  normal muscle tone. Coordination normal. No atrophy Skin: Skin warm and dry Psychiatric:  normal affect, pleasant   Recent Labs: 05/23/2022: BUN 14; Creatinine, Ser 1.21; Hemoglobin 13.3; Platelets 280; Potassium 4.2; Sodium 139    Lipid Panel Lab Results  Component Value Date   CHOL 149 08/07/2021   HDL 23 (L) 08/07/2021   LDLCALC 90 08/07/2021   TRIG 206 (H) 08/07/2021      Wt Readings from Last 3 Encounters:  07/31/22 300 lb (136.1 kg)  07/22/22 (!) 322 lb (146.1 kg)  07/18/22 (!) 322 lb (146.1 kg)     ASSESSMENT AND PLAN:  Atrial fibrillation, Paroxysmal - Plan: EKG 12-Lead CHADSVASC ,score of 1 Denies any symptoms of tachypalpitations  concerning for arrhythmia Continue diltiazem and flecainide.   Feels comfortable staying on his current medications, previously declined anticoagulation  Ulcerative colitis Previously reported bleeding diarrhea Reports symptoms are stable, comes and goes  Essential hypertension - Plan: EKG 12-Lead Blood pressure is well controlled on today's visit. No changes made to the medications.  Morbid obesity with BMI of 40.0-44.9, adult ([place]) Works on the farm, tries to stay active  We have encouraged continued exercise, careful diet management in an effort to lose weight. Limited after recent left shoulder surgery  Ventricular ectopy Asymptomatic from PVCs  Coughing Secondary to dust inhalation from working on the farm, and is combine with the corn dust Needs continued use of mask, N95   Total encounter time more than 25 minutes  Greater than 50% was spent in counseling and coordination of care with the patient    No orders of the defined types were placed in this encounter.    Signed, Esmond Plants, M.D., Ph.D. 08/02/2022  [place], [place]

## 2022-08-04 ENCOUNTER — Encounter: Payer: Self-pay | Admitting: Cardiovascular Disease

## 2022-08-04 ENCOUNTER — Ambulatory Visit: Payer: 59 | Attending: Cardiovascular Disease | Admitting: Cardiovascular Disease

## 2022-08-04 VITALS — BP 120/80 | HR 79 | Ht 73.0 in | Wt 323.5 lb

## 2022-08-04 DIAGNOSIS — I10 Essential (primary) hypertension: Secondary | ICD-10-CM | POA: Diagnosis not present

## 2022-08-04 DIAGNOSIS — I480 Paroxysmal atrial fibrillation: Secondary | ICD-10-CM | POA: Diagnosis not present

## 2022-08-04 DIAGNOSIS — I2510 Atherosclerotic heart disease of native coronary artery without angina pectoris: Secondary | ICD-10-CM | POA: Diagnosis not present

## 2022-08-04 DIAGNOSIS — I493 Ventricular premature depolarization: Secondary | ICD-10-CM | POA: Diagnosis not present

## 2022-08-04 NOTE — Patient Instructions (Signed)
Medication Instructions:  No changes  If you need a refill on your cardiac medications before your next appointment, please call your pharmacy.   Lab work: No new labs needed  Testing/Procedures: No new testing needed  Follow-Up: At [facility], you and your health needs are our priority.  As part of our continuing mission to provide you with exceptional heart care, we have created designated Provider Care Teams.  These Care Teams include your primary Cardiologist (physician) and Advanced Practice Providers (APPs -  Physician Assistants and Nurse Practitioners) who all work together to provide you with the care you need, when you need it.  You will need a follow up appointment in 12 months  Providers on your designated Care Team:   Christopher Berge, NP Ryan Dunn, PA-C Cadence Furth, PA-C  COVID-19 Vaccine Information can be found at: [email or web address] For questions related to vaccine distribution or appointments, please email [email or web address] or call [phone].   

## 2022-08-07 ENCOUNTER — Ambulatory Visit (INDEPENDENT_AMBULATORY_CARE_PROVIDER_SITE_OTHER): Payer: 59

## 2022-08-07 VITALS — BP 141/84 | HR 61 | Temp 97.9°F | Resp 18 | Ht 74.0 in | Wt 323.6 lb

## 2022-08-07 DIAGNOSIS — K51318 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: Secondary | ICD-10-CM | POA: Diagnosis not present

## 2022-08-07 MED ORDER — VEDOLIZUMAB 300 MG IV SOLR
300.0000 mg | Freq: Once | INTRAVENOUS | Status: AC
  Administered 2022-08-07: 300 mg via INTRAVENOUS
  Filled 2022-08-07: qty 5

## 2022-08-07 NOTE — Progress Notes (Signed)
Diagnosis: Crohn's Disease  Provider:  Marshell Garfinkel MD  Procedure: Infusion  IV Type: Peripheral, IV Location: R Antecubital  Entyvio (Vedolizumab), Dose: 300 mg  Infusion Start Time: 0940  Infusion Stop Time: 9409  Post Infusion IV Care: Peripheral IV Discontinued  Discharge: Condition: Good, Destination: Home . AVS provided to patient.   Performed by:  Arnoldo Morale, RN

## 2022-08-13 ENCOUNTER — Other Ambulatory Visit: Payer: Self-pay | Admitting: Urology

## 2022-08-13 ENCOUNTER — Other Ambulatory Visit: Payer: Self-pay | Admitting: Cardiovascular Disease

## 2022-08-24 ENCOUNTER — Other Ambulatory Visit: Payer: Self-pay | Admitting: Cardiovascular Disease

## 2022-08-25 ENCOUNTER — Encounter: Payer: 59 | Admitting: Family Medicine

## 2022-08-25 ENCOUNTER — Other Ambulatory Visit (INDEPENDENT_AMBULATORY_CARE_PROVIDER_SITE_OTHER): Payer: 59

## 2022-08-25 DIAGNOSIS — Z13220 Encounter for screening for lipoid disorders: Secondary | ICD-10-CM | POA: Diagnosis not present

## 2022-08-25 DIAGNOSIS — Z131 Encounter for screening for diabetes mellitus: Secondary | ICD-10-CM

## 2022-08-25 DIAGNOSIS — Z125 Encounter for screening for malignant neoplasm of prostate: Secondary | ICD-10-CM | POA: Diagnosis not present

## 2022-08-25 DIAGNOSIS — E039 Hypothyroidism, unspecified: Secondary | ICD-10-CM

## 2022-08-25 DIAGNOSIS — Z79899 Other long term (current) drug therapy: Secondary | ICD-10-CM | POA: Diagnosis not present

## 2022-08-25 LAB — CBC WITH DIFFERENTIAL/PLATELET
Basophils Absolute: 0 10*3/uL (ref 0.0–0.1)
Basophils Relative: 0.5 % (ref 0.0–3.0)
Eosinophils Absolute: 0.2 10*3/uL (ref 0.0–0.7)
Eosinophils Relative: 2.6 % (ref 0.0–5.0)
HCT: 41.5 % (ref 39.0–52.0)
Hemoglobin: 13.4 g/dL (ref 13.0–17.0)
Lymphocytes Relative: 21.3 % (ref 12.0–46.0)
Lymphs Abs: 1.5 10*3/uL (ref 0.7–4.0)
MCHC: 32.4 g/dL (ref 30.0–36.0)
MCV: 82.2 fl (ref 78.0–100.0)
Monocytes Absolute: 0.5 10*3/uL (ref 0.1–1.0)
Monocytes Relative: 7.4 % (ref 3.0–12.0)
Neutro Abs: 4.8 10*3/uL (ref 1.4–7.7)
Neutrophils Relative %: 68.2 % (ref 43.0–77.0)
Platelets: 286 10*3/uL (ref 150.0–400.0)
RBC: 5.04 Mil/uL (ref 4.22–5.81)
RDW: 15.4 % (ref 11.5–15.5)
WBC: 7 10*3/uL (ref 4.0–10.5)

## 2022-08-25 LAB — HEPATIC FUNCTION PANEL
ALT: 17 U/L (ref 0–53)
AST: 14 U/L (ref 0–37)
Albumin: 4 g/dL (ref 3.5–5.2)
Alkaline Phosphatase: 41 U/L (ref 39–117)
Bilirubin, Direct: 0.1 mg/dL (ref 0.0–0.3)
Total Bilirubin: 0.4 mg/dL (ref 0.2–1.2)
Total Protein: 6.5 g/dL (ref 6.0–8.3)

## 2022-08-25 LAB — LIPID PANEL
Cholesterol: 130 mg/dL (ref 0–200)
HDL: 28.1 mg/dL — ABNORMAL LOW (ref >39.00)
LDL Cholesterol: 80 mg/dL (ref 0–99)
NonHDL: 102.18
Total CHOL/HDL Ratio: 5
Triglycerides: 113 mg/dL (ref 0.0–149.0)
VLDL: 22.6 mg/dL (ref 0.0–40.0)

## 2022-08-25 LAB — BASIC METABOLIC PANEL
BUN: 10 mg/dL (ref 6–23)
CO2: 29 mEq/L (ref 19–32)
Calcium: 9 mg/dL (ref 8.4–10.5)
Chloride: 105 mEq/L (ref 96–112)
Creatinine, Ser: 0.86 mg/dL (ref 0.40–1.50)
GFR: 95.37 mL/min (ref >60.00)
Glucose, Bld: 92 mg/dL (ref 70–99)
Potassium: 4.4 mEq/L (ref 3.5–5.1)
Sodium: 140 mEq/L (ref 135–145)

## 2022-08-25 LAB — PSA: PSA: 0.95 ng/mL (ref 0.10–4.00)

## 2022-08-25 LAB — TSH: TSH: 2.76 u[IU]/mL (ref 0.35–5.50)

## 2022-08-25 LAB — T3, FREE: T3, Free: 3.7 pg/mL (ref 2.3–4.2)

## 2022-08-25 LAB — HEMOGLOBIN A1C: Hgb A1c MFr Bld: 5.7 % (ref 4.6–6.5)

## 2022-08-25 LAB — T4, FREE: Free T4: 0.77 ng/dL (ref 0.60–1.60)

## 2022-08-26 ENCOUNTER — Ambulatory Visit (INDEPENDENT_AMBULATORY_CARE_PROVIDER_SITE_OTHER): Payer: 59 | Admitting: Gastroenterology

## 2022-08-26 ENCOUNTER — Encounter: Payer: Self-pay | Admitting: Gastroenterology

## 2022-08-26 ENCOUNTER — Other Ambulatory Visit: Payer: 59

## 2022-08-26 VITALS — BP 140/72 | HR 68 | Ht 72.0 in | Wt 329.0 lb

## 2022-08-26 DIAGNOSIS — D3500 Benign neoplasm of unspecified adrenal gland: Secondary | ICD-10-CM | POA: Diagnosis not present

## 2022-08-26 DIAGNOSIS — K5100 Ulcerative (chronic) pancolitis without complications: Secondary | ICD-10-CM

## 2022-08-26 NOTE — Progress Notes (Signed)
HPI :  56 year old male here for a follow up visit for colitis.   Recall I initially met him in June 2023.  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  Over the past 4 years he was on a variety of regimens including sulfasalazine, Asacol, balsalazide, Lialda, and Rowasa.  He has not had any hospitalizations for his colitis over time but he has had numerous flares that have bothered him. Most recently he was switched from balsalazide to Lialda 4 tabs daily, and taking Rowasa every other day at the time when I first met him.  He also had a history of C. difficile earlier this year. He has had fecal calprotectin last October of 253.  More recently his fecal calprotectin increased to 627 on May 10.  Looking back in 2021 his fecal calprotectin was 352.  His prior colonoscopy was in August 2020 and he had active left-sided colitis with a fair prep.  At her last visit we discussed options and we continued high-dose Lialda, added Rowasa every day, and tapered off prednisone.  We proceeded with this colonoscopy for surveillance and to assess his disease activity.  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  We discussed options and ultimately transitioned him to [facility].  He reports doing really well since have last seen him.  His Entyvio appears to be working well for him.  He tolerates the infusions other than having some headaches that can bother him at times during the day of the infusion and that resolved.  He has completed his first 3 infusions.  He states his bowels have formed up, less frequency, less urgency, bleeding is resolved.  He has no pains in his abdomen.  He has stopped mesalamine and really likes that his colitis symptoms are better controlled especially being off as many pills as he was taking.  He has been able to introduce salads and popcorn back into his diet and tolerates it well.  He has otherwise had some  renal stones managed by urology.  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  He has not followed up with his primary care for reassessment of these or hormonal testing. No surgical evaluation.      Prior workup: Colonoscopy 06/06/22:The perianal and digital rectal examinations were normal. - Diffuse mild to moderate inflammation characterized by erythema, friability, granularity and loss of vascularity was found in the rectum, in the recto-sigmoid colon, in the sigmoid colon, in the descending colon, at the splenic flexure, in the transverse colon, at the hepatic flexure and in the mid ascending colon. Mostly normal rectum, very mild changes, and lower sigmoid, but from mid sigmoid to mid ascending there was mild to moderate inflammation. Biopsies were taken with a cold forceps for histology. - Multiple small-mouthed diverticula were found in the sigmoid colon and transverse colon. - Internal hemorrhoids were found during retroflexion. - The exam was otherwise without abnormality. Due to looping, ileal intubation not possible  1. Surgical [P], right colon - MILDLY ACTIVE CHRONIC COLITIS CONSISTENT WITH INFLAMMATORY BOWEL DISEASE. - NEGATIVE FOR DYSPLASIA. 2. Surgical [P], colon, transverse - MILDLY ACTIVE CHRONIC COLITIS CONSISTENT WITH INFLAMMATORY BOWEL DISEASE. - NEGATIVE FOR DYSPLASIA. 3. Surgical [P], left colon - MODERATELY ACTIVE CHRONIC COLITIS CONSISTENT WITH INFLAMMATORY BOWEL DISEASE. - NEGATIVE FOR  DYSPLASIA.   Past Medical History:  Diagnosis Date   Allergy    ARTHRITIS 06/07/2009   Arthritis    ATRIAL FIBRILLATION, PAROXYSMAL 06/07/2009   Basal cell carcinoma    CAD (coronary artery disease)    Carpal tunnel syndrome of right wrist 03/2019   Cervical disc disorder with radiculopathy of cervical region 10/05/2013   Classical migraine  03/03/2012   Colon polyp    DEPRESSION 06/07/2009   DIVERTICULITIS, COLON 06/07/2009   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   Foraminal stenosis of cervical region    GERD (gastroesophageal reflux disease)    History of kidney stones    Hypertension    Morbid obesity with BMI of 40.0-44.9, adult (HCC)    PVC (premature ventricular contraction)    Ulcerative colitis (HCC) 04/28/2018   Ventricular ectopy      Past Surgical History:  Procedure Laterality Date   CARDIAC CATHETERIZATION     COLONOSCOPY  05/2014   COLONOSCOPY WITH PROPOFOL N/A 04/20/2018   Procedure: COLONOSCOPY WITH PROPOFOL;  Surgeon: Skulskie, Martin U, MD;  Location: [facility];  Service: Endoscopy;  Laterality: N/A;   COLONOSCOPY WITH PROPOFOL N/A 06/20/2019   Procedure: COLONOSCOPY WITH PROPOFOL;  Surgeon: Skulskie, Martin U, MD;  Location: [facility];  Service: Endoscopy;  Laterality: N/A;   CYSTOSCOPY/URETEROSCOPY/HOLMIUM LASER/STENT PLACEMENT Right 07/22/2022   Procedure: CYSTOSCOPY/URETEROSCOPY/HOLMIUM LASER/STENT PLACEMENT;  Surgeon: Stoioff, Scott C, MD;  Location: [facility];  Service: Urology;  Laterality: Right;   FOOT SURGERY     left foot   kidney stone removal      SHOULDER ARTHROSCOPY WITH SUBACROMIAL DECOMPRESSION AND BICEP TENDON REPAIR Right 12/14/2018   Procedure: SHOULDER ARTHROSCOPY WITH EBRIDEMENT, DECOMPRESSION, DISTAL CLAVICLE EXCISION, BICEP TENODESIS AND DEBRIDEMENT, AND REPAIR OF ROTATOR CUFF TEAR;  Surgeon: Poggi, John J, MD;  Location: [facility];  Service: Orthopedics;  Laterality: Right;   SHOULDER ARTHROSCOPY WITH SUBACROMIAL DECOMPRESSION, ROTATOR CUFF REPAIR AND BICEP TENDON REPAIR Left 02/12/2021   Procedure: LEFT SHOULDER ARTHROSCOPY WITH DEBRIDEMENT, DECOMPRESSION, ROTATOR CUFF REPAIR, BICEPS TENODESIS, AND DISTAL CLAVICLE EXCISION;  Surgeon: Poggi, John J, MD;  Location: [facility];  Service: Orthopedics;  Laterality: Left;   TONSILLECTOMY     Family History  Problem  Relation Age of Onset   Colon polyps Mother    Stroke Father    Arthritis Other    Diabetes Other        1st degree relative   Stroke Other        F 1st degree relative <60; M 1st degree relative <50   Colon cancer Neg Hx    Stomach cancer Neg Hx    Esophageal cancer Neg Hx    Social History   Tobacco Use   Smoking status: Never   Smokeless tobacco: Former    Types: Snuff   Tobacco comments:    occasionally  Vaping Use   Vaping Use: Never used  Substance Use Topics   Alcohol use: Not Currently    Alcohol/week: 0.0 standard drinks of alcohol   Drug use: Never   Current Outpatient Medications  Medication Sig Dispense Refill   acetaminophen (TYLENOL) 500 MG tablet Take 1,000 mg by mouth every 6 (six) hours as needed for moderate pain.      cyanocobalamin (VITAMIN B12) 1000 MCG/ML injection Inject 1 mL (1,000 mcg total) into the muscle every 30 (thirty) days. 1 mL 2   diazepam (VALIUM) 5 MG tablet TAKE 1 TABLET BY MOUTH EVERY 8 HOURS AS NEEDED FOR ANXIETY. 30   tablet 0   diltiazem (CARDIZEM CD) 180 MG 24 hr capsule TAKE 1 CAPSULE BY MOUTH EVERY DAY 90 capsule 3   flecainide (TAMBOCOR) 100 MG tablet TAKE 1 TABLET BY MOUTH 2 (TWO) TIMES DAILY. 180 tablet 3   losartan (COZAAR) 25 MG tablet TAKE 1 TABLET (25 MG TOTAL) BY MOUTH DAILY. 90 tablet 3   SYRINGE-NEEDLE, DISP, 3 ML (B-D 3CC LUER-LOK SYR 22GX1") 22G X 1" 3 ML MISC USE TO INJECT B 12 INJECTION     TURMERIC PO Take 1,000 mg by mouth 2 (two) times daily.     vedolizumab (ENTYVIO) 300 MG injection Inject 300 mg into the vein every 8 (eight) weeks. 1 each 0   vitamin C (ASCORBIC ACID) 500 MG tablet Take 500 mg by mouth 2 (two) times daily.     Vitamin D, Cholecalciferol, 10 MCG (400 UNIT) CAPS Take 400 Units by mouth 2 (two) times daily.     oxybutynin (DITROPAN) 5 MG tablet 1 tab tid prn frequency,urgency, bladder spasm (Patient not taking: Reported on 08/04/2022) 30 tablet 0   oxyCODONE (ROXICODONE) 5 MG immediate release tablet  Take 1 tablet (5 mg total) by mouth every 8 (eight) hours as needed. (Patient not taking: Reported on 08/04/2022) 8 tablet 0   tamsulosin (FLOMAX) 0.4 MG CAPS capsule TAKE 1 CAPSULE (0.4 MG TOTAL) BY MOUTH DAILY AFTER BREAKFAST. (Patient not taking: Reported on 08/26/2022) 90 capsule 1   No current facility-administered medications for this visit.   Allergies  Allergen Reactions   Biaxin [Clarithromycin] Other (See Comments)    Pt has history of arrhythmias   Other Other (See Comments)   Penicillins Rash    Did it involve swelling of the face/tongue/throat, SOB, or low BP? Unknown Did it involve sudden or severe rash/hives, skin peeling, or any reaction on the inside of your mouth or nose? Unknown Did you need to seek medical attention at a hospital or doctor's office? Unknown When did it last happen?      childhood allergy If all above answers are "NO", may proceed with cephalosporin use.      Review of Systems: All systems reviewed and negative except where noted in HPI.   Lab Results  Component Value Date   WBC 7.0 08/25/2022   HGB 13.4 08/25/2022   HCT 41.5 08/25/2022   MCV 82.2 08/25/2022   PLT 286.0 08/25/2022    Lab Results  Component Value Date   CREATININE 0.86 08/25/2022   BUN 10 08/25/2022   NA 140 08/25/2022   K 4.4 08/25/2022   CL 105 08/25/2022   CO2 29 08/25/2022    Lab Results  Component Value Date   ALT 17 08/25/2022   AST 14 08/25/2022   ALKPHOS 41 08/25/2022   BILITOT 0.4 08/25/2022     Physical Exam: BP (!) 140/72   Pulse 68   Ht 6' (1.829 m)   Wt (!) 329 lb (149.2 kg)   BMI 44.62 kg/m  Constitutional: Pleasant,well-developed, male in no acute distress. Neurological: Alert and oriented to person place and time. Psychiatric: Normal mood and affect. Behavior is normal.   ASSESSMENT: 56 y.o. male here for assessment of the following  1. 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 Adrenal adenoma, unspecified laterality     As above, failed oral and topical mesalamine, historically thought to have left-sided colitis but on recent colonoscopy had pancolitis.  Transition to Entyvio since his last visit and after 3 doses he is feeling much better.    Clinical resolution of his symptoms, appears to be tolerating Entyvio well.  We discussed his course today, recommend continuing Entyvio at this point every 8 weeks, he is agreeable to this.  Recommended that he get his vaccinations for which she is due to include flu, and pneumonia which appears out of date.  Safety a COVID booster and RSV vaccine if he so wishes to pursue those.  For surveillance at this point time would like to do a fecal calprotectin in the upcoming weeks to trend this now that he is on new therapy.  Also recommending a follow-up colonoscopy within the next year or so.  I will see him in 6 months for reassessment or sooner with any issues.  Otherwise reviewed the results of his last CT scan done for renal stones with adrenal lesions.  He will be seeing his PCP next week for physical, recommend he review this with them to determine if any further work-up / testing needs to be done, he agrees.   PLAN: - continue Entyvio every 8 weeks - lab for fecal calprotectin - okay to get flu, pneumonia, COVID, RSV vaccines - colonoscopy within next year - follow up with me in 6 months - follow up with PCP about adrenal lesions noted on CT scan   Steve , MD [facility] 

## 2022-08-26 NOTE — Patient Instructions (Signed)
_______________________________________________________  If you are age 58 or older, your body mass index should be between 23-30. Your Body mass index is 44.62 kg/m. If this is out of the aforementioned range listed, please consider follow up with your Primary Care Provider.  If you are age 81 or younger, your body mass index should be between 19-25. Your Body mass index is 44.62 kg/m. If this is out of the aformentioned range listed, please consider follow up with your Primary Care Provider.   ________________________________________________________  The [facility] providers would like to encourage you to use [facility] to communicate with providers for non-urgent requests or questions.  Due to long hold times on the telephone, sending your provider a message by [facility] may be a faster and more efficient way to get a response.  Please allow 48 business hours for a response.  Please remember that this is for non-urgent requests.  _______________________________________________________  Your provider has requested that you go to the basement level for lab work before leaving today. Press "B" on the elevator. The lab is located at the first door on the left as you exit the elevator.  Please follow up in 6 months  Thank you for trusting me with your gastrointestinal care!

## 2022-08-31 NOTE — Progress Notes (Unsigned)
Brandon [name] T. Rayneisha Bouza, MD, [place] at [facility] [place], [address]  Phone: [phone]  FAX: [phone]  Brandon [name] - 56 y.o. male  MRN [id number]  Date of Birth: March 01, 1966  Date: 09/01/2022  PCP: Owens Loffler, MD  Referral: Owens Loffler, MD  No chief complaint on file.  Patient Care Team: Owens Loffler, MD as PCP - General Rockey Situ, Kathlene November, MD as PCP - Cardiology (Cardiology) Minna Merritts, MD as Consulting Physician (Cardiology) Subjective:   Brandon [name] is a 56 y.o. pleasant patient who presents with the following:  Preventative Health Maintenance Visit:  Health Maintenance Summary Reviewed and updated, unless pt declines services.  Tobacco History Reviewed. Alcohol: No concerns, no excessive use Exercise Habits: Some activity, rec at least 30 mins 5 times a week STD concerns: no risk or activity to increase risk Drug Use: None  Shingrix vaccine COVID booster Tdap Flu    Health Maintenance  Topic Date Due   Zoster Vaccines- Shingrix (1 of 2) Never done   COVID-19 Vaccine (5 - Pfizer risk series) 04/18/2020   TETANUS/TDAP  12/04/2020   INFLUENZA VACCINE  06/03/2022   COLONOSCOPY (Pts 45-41yr Insurance coverage will need to be confirmed)  06/06/2025   Hepatitis C Screening  Completed   HIV Screening  Completed   HPV VACCINES  Aged Out   Immunization History  Administered Date(s) Administered   Influenza Whole 11/15/2009, 07/14/2011   Influenza, Seasonal, Injecte, Preservative Fre 11/08/2012   Influenza,inj,Quad PF,6+ Mos 08/24/2013, 08/16/2014, 08/20/2015, 06/23/2016, 10/08/2017, 08/19/2018, 08/01/2019, 08/27/2020   PFIZER Comirnaty(Gray Top)Covid-19 Tri-Sucrose Vaccine 02/01/2020, 02/22/2020   PFIZER(Purple Top)SARS-COV-2 Vaccination 02/01/2020, 02/22/2020   Td 12/04/2010   Patient Active Problem List   Diagnosis Date Noted   ATRIAL FIBRILLATION,  PAROXYSMAL 06/07/2009    Priority: High   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 [place] 0[place]/01[place]5464   Priority: Medium    Essential hypertension 06/16/2013    Priority: Medium    Morbid obesity with BMI of 40.0-44.9, adult ([place] 06/07/2009    Priority: Medium    Classical migraine 03/03/2012    Priority: Low   GERD (gastroesophageal reflux disease) 05/13/2016   Cervical disc disorder with radiculopathy of cervical region 10/05/2013   Carpal tunnel syndrome 10/05/2013   Basal cell carcinoma 06/25/2011   DIVERTICULITIS, COLON 06/07/2009   ARTHRITIS 06/07/2009    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   Foraminal stenosis of cervical region    GERD (gastroesophageal reflux disease)    History of kidney stones    Hypertension    Morbid obesity with BMI of 40.0-44.9, adult (HCC)    PVC (premature ventricular contraction)    [place]cerative colitis ([place] 04/28/2018   Ventricular ectopy     Past Surgical History:  Procedure Laterality Date   CARDIAC CATHETERIZATION     COLONOSCOPY  05/2014   COLONOSCOPY WITH PROPOFOL N/A 04/20/2018   Procedure: COLONOSCOPY WITH PROPOFOL;  Surgeon: SLollie Sails MD;  [facility];  Service: Endoscopy;  Laterality: N/A;   COLONOSCOPY WITH PROPOFOL N/A 06/20/2019   Procedure: COLONOSCOPY WITH PROPOFOL;  Surgeon: Lollie Sails, MD;  Location: [facility]COPY;  Service: Endoscopy;  Laterality: N/A;   CYSTOSCOPY/URETEROSCOPY/HOLMIUM LASER/STENT PLACEMENT Right 07/22/2022   Procedure:  CYSTOSCOPY/URETEROSCOPY/HOLMIUM LASER/STENT PLACEMENT;  Surgeon: Abbie Sons, MD;  Location: [facility] ORS;  Service: Urology;  Laterality: Right;   FOOT SURGERY     left foot   kidney stone removal      SHOULDER ARTHROSCOPY WITH SUBACROMIAL DECOMPRESSION AND BICEP TENDON REPAIR Right 12/14/2018   Procedure: SHOULDER ARTHROSCOPY WITH EBRIDEMENT, DECOMPRESSION, DISTAL CLAVICLE EXCISION, BICEP TENODESIS AND DEBRIDEMENT, AND REPAIR OF ROTATOR CUFF TEAR;  Surgeon: Corky Mull, MD;  Location: [facility] ORS;  Service: Orthopedics;  Laterality: Right;   SHOULDER ARTHROSCOPY WITH SUBACROMIAL DECOMPRESSION, ROTATOR CUFF REPAIR AND BICEP TENDON REPAIR Left 02/12/2021   Procedure: LEFT SHOULDER ARTHROSCOPY WITH DEBRIDEMENT, DECOMPRESSION, ROTATOR CUFF REPAIR, BICEPS TENODESIS, AND DISTAL CLAVICLE EXCISION;  Surgeon: Corky Mull, MD;  Location: [facility] ORS;  Service: Orthopedics;  Laterality: Left;   TONSILLECTOMY      Family History  Problem Relation Age of Onset   Colon polyps Mother    Stroke Father    Arthritis Other    Diabetes Other        1st degree relative   Stroke Other        F 1st degree relative <60; M 1st degree relative <50   Colon cancer Neg Hx    Stomach cancer Neg Hx    Esophageal cancer Neg Hx     Social History   Social History Narrative   Does not do regular exercise.    Lives alone    Past Medical History, Surgical History, Social History, Family History, Problem List, Medications, and Allergies have been reviewed and updated if relevant.  Review of Systems: Pertinent positives are listed above.  Otherwise, a full 14 point review of systems has been done in full and it is negative except where it is noted positive.  Objective:   There were no vitals taken for this visit. Ideal Body Weight:    Ideal Body Weight:   No results found.    08/01/2019    2:31 PM 05/17/2018    9:08 AM 04/29/2017    8:49 AM  Depression screen PHQ 2/9  Decreased Interest 0 0 0  Down,  Depressed, Hopeless 0 0 0  PHQ - 2 Score 0 0 0     GEN: well developed, well nourished, no acute distress Eyes: conjunctiva and lids normal, PERRLA, EOMI ENT: TM clear, nares clear, oral exam WNL Neck: supple, no lymphadenopathy, no thyromegaly, no JVD Pulm: clear to auscultation and percussion, respiratory effort normal CV: regular rate and rhythm, S1-S2, no murmur, rub or gallop, no bruits, peripheral pulses normal and symmetric, no cyanosis, clubbing, edema or varicosities GI: soft, non-tender; no hepatosplenomegaly, masses; active bowel sounds all quadrants GU: deferred Lymph: no cervical, axillary or inguinal adenopathy MSK: gait normal, muscle tone and strength WNL, no joint swelling, effusions, discoloration, crepitus  SKIN: clear, good turgor, color WNL, no rashes, lesions, or ulcerations Neuro: normal mental status, normal strength, sensation, and motion Psych: alert; oriented to person, place and time, normally interactive and not anxious or depressed in appearance.  All labs reviewed with patient. Results for orders placed or performed in visit on 08/25/22  Lipid panel  Result Value Ref Range   Cholesterol 130 0 - 200 mg/dL   Triglycerides 113.0 0.0 - 149.0 mg/dL  HDL 28.10 (L) >39.00 mg/dL   VLDL 22.6 0.0 - 40.0 mg/dL   LDL Cholesterol 80 0 - 99 mg/dL   Total CHOL/HDL Ratio 5    NonHDL 102.18   Hepatic function panel  Result Value Ref Range   Total Bilirubin 0.4 0.2 - 1.2 mg/dL   Bilirubin, Direct 0.1 0.0 - 0.3 mg/dL   Alkaline Phosphatase 41 39 - 117 U/L   AST 14 0 - 37 U/L   ALT 17 0 - 53 U/L   Total Protein 6.5 6.0 - 8.3 g/dL   Albumin 4.0 3.5 - 5.2 g/dL  Basic metabolic panel  Result Value Ref Range   Sodium 140 135 - 145 mEq/L   Potassium 4.4 3.5 - 5.1 mEq/L   Chloride 105 96 - 112 mEq/L   CO2 29 19 - 32 mEq/L   Glucose, Bld 92 70 - 99 mg/dL   BUN 10 6 - 23 mg/dL   Creatinine, Ser 0.86 0.40 - 1.50 mg/dL   GFR 95.37 >60.00 mL/min   Calcium 9.0 8.4 -  10.5 mg/dL  CBC with Differential/Platelet  Result Value Ref Range   WBC 7.0 4.0 - 10.5 K/uL   RBC 5.04 4.22 - 5.81 Mil/uL   Hemoglobin 13.4 13.0 - 17.0 g/dL   HCT 41.5 39.0 - 52.0 %   MCV 82.2 78.0 - 100.0 fl   MCHC 32.4 30.0 - 36.0 g/dL   RDW 15.4 11.5 - 15.5 %   Platelets 286.0 150.0 - 400.0 K/uL   Neutrophils Relative % 68.2 43.0 - 77.0 %   Lymphocytes Relative 21.3 12.0 - 46.0 %   Monocytes Relative 7.4 3.0 - 12.0 %   Eosinophils Relative 2.6 0.0 - 5.0 %   Basophils Relative 0.5 0.0 - 3.0 %   Neutro Abs 4.8 1.4 - 7.7 K/uL   Lymphs Abs 1.5 0.7 - 4.0 K/uL   Monocytes Absolute 0.5 0.1 - 1.0 K/uL   Eosinophils Absolute 0.2 0.0 - 0.7 K/uL   Basophils Absolute 0.0 0.0 - 0.1 K/uL  Hemoglobin A1c  Result Value Ref Range   Hgb A1c MFr Bld 5.7 4.6 - 6.5 %  PSA  Result Value Ref Range   PSA 0.95 0.10 - 4.00 ng/mL  T3, free  Result Value Ref Range   T3, Free 3.7 2.3 - 4.2 pg/mL  T4, free  Result Value Ref Range   Free T4 0.77 0.60 - 1.60 ng/dL  TSH  Result Value Ref Range   TSH 2.76 0.35 - 5.50 uIU/mL    Assessment and Plan:     ICD-10-CM   1. Healthcare maintenance  Z00.00       Health Maintenance Exam: The patient's preventative maintenance and recommended screening tests for an annual wellness exam were reviewed in full today. Brought up to date unless services declined.  Counselled on the importance of diet, exercise, and its role in overall health and mortality. The patient's FH and SH was reviewed, including their home life, tobacco status, and drug and alcohol status.  Follow-up in 1 year for physical exam or additional follow-up below.  Disposition: No follow-ups on file.  No orders of the defined types were placed in this encounter.  There are no discontinued medications. No orders of the defined types were placed in this encounter.   Signed,  Maud Deed. Maximino Cozzolino, MD   Allergies as of 09/01/2022       Reactions   Biaxin [clarithromycin] Other (See  Comments)   Pt has history of  arrhythmias   Other Other (See Comments)   Penicillins Rash   Did it involve swelling of the face/tongue/throat, SOB, or low BP? Unknown Did it involve sudden or severe rash/hives, skin peeling, or any reaction on the inside of your mouth or nose? Unknown Did you need to seek medical attention at a hospital or doctor's office? Unknown When did it last happen?      childhood allergy If all above answers are "NO", may proceed with cephalosporin use.        Medication List        Accurate as of August 31, 2022  8:59 AM. If you have any questions, ask your nurse or doctor.          acetaminophen 500 MG tablet Commonly known as: TYLENOL Take 1,000 mg by mouth every 6 (six) hours as needed for moderate pain.   ascorbic acid 500 MG tablet Commonly known as: VITAMIN C Take 500 mg by mouth 2 (two) times daily.   B-D 3CC LUER-LOK SYR 22GX1" 22G X 1" 3 ML Misc Generic drug: SYRINGE-NEEDLE (DISP) 3 ML USE TO INJECT B 12 INJECTION   cyanocobalamin 1000 MCG/ML injection Commonly known as: VITAMIN B12 Inject 1 mL (1,000 mcg total) into the muscle every 30 (thirty) days.   diazepam 5 MG tablet Commonly known as: VALIUM TAKE 1 TABLET BY MOUTH EVERY 8 HOURS AS NEEDED FOR ANXIETY.   diltiazem 180 MG 24 hr capsule Commonly known as: CARDIZEM CD TAKE 1 CAPSULE BY MOUTH EVERY DAY   flecainide 100 MG tablet Commonly known as: TAMBOCOR TAKE 1 TABLET BY MOUTH 2 (TWO) TIMES DAILY.   losartan 25 MG tablet Commonly known as: COZAAR TAKE 1 TABLET (25 MG TOTAL) BY MOUTH DAILY.   oxybutynin 5 MG tablet Commonly known as: DITROPAN 1 tab tid prn frequency,urgency, bladder spasm   oxyCODONE 5 MG immediate release tablet Commonly known as: Roxicodone Take 1 tablet (5 mg total) by mouth every 8 (eight) hours as needed.   tamsulosin 0.4 MG Caps capsule Commonly known as: FLOMAX TAKE 1 CAPSULE (0.4 MG TOTAL) BY MOUTH DAILY AFTER BREAKFAST.   TURMERIC  PO Take 1,000 mg by mouth 2 (two) times daily.   vedolizumab 300 MG injection Commonly known as: ENTYVIO Inject 300 mg into the vein every 8 (eight) weeks.   Vitamin D (Cholecalciferol) 10 MCG (400 UNIT) Caps Take 400 Units by mouth 2 (two) times daily.

## 2022-09-01 ENCOUNTER — Encounter: Payer: Self-pay | Admitting: Family Medicine

## 2022-09-01 ENCOUNTER — Ambulatory Visit (INDEPENDENT_AMBULATORY_CARE_PROVIDER_SITE_OTHER): Payer: 59 | Admitting: Family Medicine

## 2022-09-01 VITALS — BP 120/68 | HR 73 | Temp 98.7°F | Ht 71.75 in | Wt 326.5 lb

## 2022-09-01 DIAGNOSIS — Z0000 Encounter for general adult medical examination without abnormal findings: Secondary | ICD-10-CM

## 2022-09-01 DIAGNOSIS — Z23 Encounter for immunization: Secondary | ICD-10-CM

## 2022-09-16 ENCOUNTER — Other Ambulatory Visit: Payer: Self-pay | Admitting: Gastroenterology

## 2022-09-16 ENCOUNTER — Other Ambulatory Visit: Payer: Self-pay | Admitting: Family Medicine

## 2022-09-17 ENCOUNTER — Other Ambulatory Visit: Payer: Self-pay

## 2022-09-17 ENCOUNTER — Telehealth: Payer: Self-pay | Admitting: Gastroenterology

## 2022-09-17 MED ORDER — CYANOCOBALAMIN 1000 MCG/ML IJ SOLN: 1000.0000 ug | INTRAMUSCULAR | 1 refills | Status: DC

## 2022-09-17 NOTE — Telephone Encounter (Signed)
That's fine. If he would like our office to help with it, we can refill it for him. Thanks

## 2022-09-17 NOTE — Telephone Encounter (Signed)
Last office visit 09/01/22 for CPE.  Last refilled 08/05/2021 for #30 with no refills.  No future appointments with PCP.

## 2022-09-17 NOTE — Telephone Encounter (Signed)
Jan I don't believe I have prescribed him that in the past. He should discuss with his PCP who has ordered that for him in regards to long term plan, unless I have ordered it for him. Thanks

## 2022-09-17 NOTE — Progress Notes (Signed)
Refill sent to pharmacy.   

## 2022-09-17 NOTE — Telephone Encounter (Signed)
Inbound call from patient requesting a call back to discuss medication he is taking. Please advise.

## 2022-09-17 NOTE — Telephone Encounter (Signed)
Pt called the pharmacy for refill on his Vit B 12 injections. He was told by the pharmacy to contact the MD office because Dr. Havery Moros had not prescribed it. Pt called back and states that he spoke with Dr. Havery Moros and was told our office would prescribe it for him. Please advise.

## 2022-09-18 NOTE — Telephone Encounter (Signed)
New script was sent yesterday, 11-15

## 2022-10-02 ENCOUNTER — Ambulatory Visit (INDEPENDENT_AMBULATORY_CARE_PROVIDER_SITE_OTHER): Payer: 59

## 2022-10-02 VITALS — BP 137/81 | HR 60 | Temp 97.8°F | Resp 20 | Ht 74.0 in | Wt 338.0 lb

## 2022-10-02 DIAGNOSIS — K51318 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: Secondary | ICD-10-CM | POA: Diagnosis not present

## 2022-10-02 MED ORDER — VEDOLIZUMAB 300 MG IV SOLR
300.0000 mg | Freq: Once | INTRAVENOUS | Status: AC
  Administered 2022-10-02: 300 mg via INTRAVENOUS
  Filled 2022-10-02: qty 5

## 2022-10-02 NOTE — Progress Notes (Signed)
Diagnosis: Crohn's Disease  Provider:  Marshell Garfinkel MD  Procedure: Infusion  IV Type: Peripheral, IV Location: R Antecubital  Entyvio (Vedolizumab), Dose: 300 mg  Infusion Start Time: 3601  Infusion Stop Time: 6580  Post Infusion IV Care: Peripheral IV Discontinued  Discharge: Condition: Good, Destination: Home . AVS provided to patient.   Performed by:  Adelina Mings, LPN

## 2022-10-29 ENCOUNTER — Telehealth: Payer: Self-pay | Admitting: Gastroenterology

## 2022-10-29 NOTE — Telephone Encounter (Signed)
Patient called, was not open to speaking to me about the reason for his call. Patient wanted to speak to nurse in regards to his call. Please advise.

## 2022-10-29 NOTE — Telephone Encounter (Signed)
Returned call to patient. He states that on 12/25 he experienced nausea, vomiting, and diarrhea. Pt states that he did try new food on 10/26/22. I advised patient that it is likely the new food he ate or possibly a virus. Pt states that he has only been around his family and no one is sick. Pt states that his symptoms seem to be improving he was able to eat a light lunch today and kept that down fine and hasn't had diarrhea. I told pt that he should push fluids and continue to monitor symptoms as it sounds like this could resolve on its own. Pt also wanted to make sure that this was not related to his Entyvio infusions, I told pt that it is not likely as he has been receiving the infusion for months now. I offered to send message to [facility] to see if we could send in RX for antiemetic, pt declined. Pt will contact us if he has continued or worsening symptoms. Pt verbalized understanding and had no concerns at the end of the call.

## 2022-10-30 ENCOUNTER — Telehealth: Payer: Self-pay | Admitting: Family Medicine

## 2022-10-30 NOTE — Telephone Encounter (Signed)
Patient called and states he has had lower abdominal pain right where the bladder is. He does not have complaints of dysuria and incomplete bladder emptying. He denies fever or chills. He states he did have a stomach virus a couple days ago with diarrhea and nausea. He states it has subsided. I informed him that since he does not have any urinary symptoms he may need to go to a urgent care or PCP since he has recently has a stomach virus. He may need to be evaluated for this.

## 2022-11-28 ENCOUNTER — Other Ambulatory Visit: Payer: 59

## 2022-11-28 ENCOUNTER — Ambulatory Visit (INDEPENDENT_AMBULATORY_CARE_PROVIDER_SITE_OTHER): Payer: 59

## 2022-11-28 VITALS — BP 144/83 | HR 68 | Temp 98.2°F | Resp 16 | Ht 72.0 in | Wt 337.0 lb

## 2022-11-28 DIAGNOSIS — K5100 Ulcerative (chronic) pancolitis without complications: Secondary | ICD-10-CM

## 2022-11-28 DIAGNOSIS — D3500 Benign neoplasm of unspecified adrenal gland: Secondary | ICD-10-CM | POA: Diagnosis not present

## 2022-11-28 DIAGNOSIS — K51318 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: Secondary | ICD-10-CM

## 2022-11-28 MED ORDER — VEDOLIZUMAB 300 MG IV SOLR
300.0000 mg | Freq: Once | INTRAVENOUS | Status: AC
  Administered 2022-11-28: 300 mg via INTRAVENOUS
  Filled 2022-11-28: qty 5

## 2022-11-28 NOTE — Progress Notes (Signed)
Diagnosis: Crohn's Disease  Provider:  Marshell Garfinkel MD  Procedure: Infusion  IV Type: Peripheral, IV Location: R Antecubital  Entyvio (Vedolizumab), Dose: 300 mg  Infusion Start Time: 1001  Infusion Stop Time: 1033  Post Infusion IV Care: Peripheral IV Discontinued  Discharge: Condition: Good, Destination: Home . AVS provided to patient.   Performed by:  Adelina Mings, LPN

## 2022-12-03 LAB — CALPROTECTIN, FECAL: Calprotectin, Fecal: 53 ug/g (ref 0–120)

## 2023-01-23 ENCOUNTER — Ambulatory Visit (INDEPENDENT_AMBULATORY_CARE_PROVIDER_SITE_OTHER): Payer: 59 | Admitting: *Deleted

## 2023-01-23 VITALS — BP 129/78 | HR 56 | Temp 97.7°F | Resp 18 | Ht 75.0 in | Wt 342.8 lb

## 2023-01-23 DIAGNOSIS — K51318 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: Secondary | ICD-10-CM | POA: Diagnosis not present

## 2023-01-23 MED ORDER — VEDOLIZUMAB 300 MG IV SOLR
300.0000 mg | Freq: Once | INTRAVENOUS | Status: AC
  Administered 2023-01-23: 300 mg via INTRAVENOUS
  Filled 2023-01-23: qty 5

## 2023-01-23 NOTE — Progress Notes (Signed)
Diagnosis: Crohn's Disease  Provider:  Marshell Garfinkel MD  Procedure: Infusion  IV Type: Peripheral, IV Location: R Antecubital  Entyvio (Vedolizumab), Dose: 300 mg  Infusion Start Time: [id number]  Infusion Stop Time: 1009  Post Infusion IV Care: Patient declined observation and Peripheral IV Discontinued  Discharge: Condition: Good, Destination: Home . AVS Declined  Performed by:  Baxter Hire, RN

## 2023-03-04 ENCOUNTER — Encounter: Payer: Self-pay | Admitting: Gastroenterology

## 2023-03-04 ENCOUNTER — Other Ambulatory Visit (INDEPENDENT_AMBULATORY_CARE_PROVIDER_SITE_OTHER): Payer: 59

## 2023-03-04 ENCOUNTER — Ambulatory Visit: Payer: 59 | Admitting: Gastroenterology

## 2023-03-04 VITALS — BP 120/76 | HR 57 | Ht 74.0 in | Wt 340.0 lb

## 2023-03-04 DIAGNOSIS — D3500 Benign neoplasm of unspecified adrenal gland: Secondary | ICD-10-CM

## 2023-03-04 DIAGNOSIS — K5100 Ulcerative (chronic) pancolitis without complications: Secondary | ICD-10-CM

## 2023-03-04 DIAGNOSIS — E559 Vitamin D deficiency, unspecified: Secondary | ICD-10-CM

## 2023-03-04 LAB — VITAMIN D 25 HYDROXY (VIT D DEFICIENCY, FRACTURES): VITD: 36.64 ng/mL (ref 30.00–100.00)

## 2023-03-04 MED ORDER — CITRUCEL PO POWD: 1.0000 | Freq: Every day | ORAL | Status: DC

## 2023-03-04 NOTE — Patient Instructions (Signed)
If your blood pressure at your visit was 140/90 or greater, please contact your primary care physician to follow up on this. ______________________________________________________  If you are age 59 or older, your body mass index should be between 23-30. Your Body mass index is 43.65 kg/m. If this is out of the aforementioned range listed, please consider follow up with your Primary Care Provider.  If you are age 35 or younger, your body mass index should be between 19-25. Your Body mass index is 43.65 kg/m. If this is out of the aformentioned range listed, please consider follow up with your Primary Care Provider.  ________________________________________________________  The [facility] providers would like to encourage you to use [facility] to communicate with providers for non-urgent requests or questions.  Due to long hold times on the telephone, sending your provider a message by [facility] may be a faster and more efficient way to get a response.  Please allow 48 business hours for a response.  Please remember that this is for non-urgent requests.  _______________________________________________________  Due to recent changes in healthcare laws, you may see the results of your imaging and laboratory studies on MyChart before your provider has had a chance to review them.  We understand that in some cases there may be results that are confusing or concerning to you. Not all laboratory results come back in the same time frame and the provider may be waiting for multiple results in order to interpret others.  Please give [place] 48 hours in order for your provider to thoroughly review all the results before contacting the office for clarification of your results.   You have been scheduled for a colonoscopy on Monday, 8-19 at 9:00am.  You will need to arrive at 8:00am to the 4th floor and have a driver that can stay and drive you home.  You have been scheduled for Nurse appointment on Monday, 8-5 at 10:00am  to be instructed on how to prep for the colonoscopy in August. Please arrive 10 minutes early for registration and go to the 2nd floor.  Continue Entyvio.  Please purchase the following medications over the counter and take as directed: Citrucel  Please follow up with Urology.  Thank you for entrusting me with your care and for choosing [facility], Dr. Ileene Patrick

## 2023-03-04 NOTE — Progress Notes (Signed)
HPI :  56 year old male here for a follow-up visit for ulcerative colitis.  UC HISTORY: Dx 2019, I met him in June 2023.  He had been on a variety of mesalamine regimens including sulfasalazine, Asacol, balsalazide, Lialda, and Rowasa.  Numerous flares on these regimens.  History of C. difficile as well.  Elevated fecal calprotectin upwards of 6-700s in recent years on mesalamine.  Had active inflammation on his last colonoscopy and started on Entyvio 06/2022.   SINCE LAST VISIT:  He is here for follow-up visit today.  He has been doing really well since have last seen him.  He tolerates the Entyvio infusions every 8 weeks.  His bowel habits are mostly regular.  Previously had loose stools.  Now if anything has been having occasional constipation.  He has no bleeding symptoms.  If he is constipated he can get some lower abdominal cramping that is relieved with a bowel movement.  Otherwise feeling well.  No cardiopulmonary symptoms.  He had a fecal calprotectin in January which was 53.  Recall he has a history of renal stones managed by urology.  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  Radiology recommended surgical evaluation.  I had spoken with him about this at the last visit, he states he has not followed up with surgery/urology yet or his PCP for this.  He has not had hormonal testing for the adrenal adenoma.  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  If he is considering colonoscopy he will not be able to do it until August or later.   Otherwise he has a history of vitamin D deficiency, on vitamin D supplementation.  He is inquiring about follow-up labs to make sure he is responding to it.    Prior workup: Colonoscopy 06/06/22:The perianal and digital rectal examinations were normal. - Diffuse mild to moderate inflammation characterized by erythema,  friability, granularity and loss of vascularity was found in the rectum, in the recto-sigmoid colon, in the sigmoid colon, in the descending colon, at the splenic flexure, in the transverse colon, at the hepatic flexure and in the mid ascending colon. Mostly normal rectum, very mild changes, and lower sigmoid, but from mid sigmoid to mid ascending there was mild to moderate inflammation. Biopsies were taken with a cold forceps for histology. - Multiple small-mouthed diverticula were found in the sigmoid colon and transverse colon. - Internal hemorrhoids were found during retroflexion. - The exam was otherwise without abnormality. Due to looping, ileal intubation not possible   1. Surgical [P], right colon - MILDLY ACTIVE CHRONIC COLITIS CONSISTENT WITH INFLAMMATORY BOWEL DISEASE. - NEGATIVE FOR DYSPLASIA. 2. Surgical [P], colon, transverse - MILDLY ACTIVE CHRONIC COLITIS CONSISTENT WITH INFLAMMATORY BOWEL DISEASE. - NEGATIVE FOR DYSPLASIA. 3. Surgical [P], left colon - MODERATELY ACTIVE CHRONIC COLITIS CONSISTENT WITH INFLAMMATORY BOWEL DISEASE. - NEGATIVE FOR DYSPLASIA.     Fecal calprotectin Jan 2024 - value of 53 (down from 700s previously)  CT renal stone study 05/23/22: IMPRESSION: 1. Obstructive 5 mm right ureterovesicular junction stone. 2. Interval increase in size of a 4.8 cm left adrenal mass, consistent with lipid-rich benign adenoma. No follow-up imaging is recommended. JACR 2017 Aug; 14(8):1038-44, JCAT 2016 Mar-Apr; 40(2):194-200, Urol J 2006 Spring; 3(2):71-4. 3. 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 Surgical consultation is recommended  as myelolipomas > 4 cm are at an increased risk for hemorrhage and rupture. Consider biochemical lab evaluation for functional status and pheochromocytoma prior to resection. JACR 2017 Aug; 14(8):1038-44, JCAT 2016 Mar-Apr; 40(2):194-200, Urol J 2006 Spring; 3(2):71-4. 4.  Colonic diverticulosis with no acute diverticulitis.      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   Foraminal stenosis of cervical region    GERD (gastroesophageal reflux disease)    History of kidney stones    Hypertension    Morbid obesity with BMI of 40.0-44.9, adult (HCC)    PVC (premature ventricular contraction)    Ulcerative colitis (HCC) 04/28/2018   Ventricular ectopy      Past Surgical History:  Procedure Laterality Date   CARDIAC CATHETERIZATION     COLONOSCOPY  05/2014   COLONOSCOPY WITH PROPOFOL N/A 04/20/2018   Procedure: COLONOSCOPY WITH PROPOFOL;  Surgeon: Christena Deem, MD;  Location: [facility] ENDOSCOPY;  Service: Endoscopy;  Laterality: N/A;   COLONOSCOPY WITH PROPOFOL N/A 06/20/2019   Procedure: COLONOSCOPY WITH PROPOFOL;  Surgeon: Christena Deem, MD;  Location: [facility] ENDOSCOPY;  Service: Endoscopy;  Laterality: N/A;   CYSTOSCOPY/URETEROSCOPY/HOLMIUM LASER/STENT PLACEMENT Right 07/22/2022   Procedure: CYSTOSCOPY/URETEROSCOPY/HOLMIUM LASER/STENT PLACEMENT;  Surgeon: Riki Altes, MD;  Location: [facility] ORS;  Service: Urology;  Laterality: Right;   FOOT SURGERY     left foot   kidney stone removal      SHOULDER ARTHROSCOPY WITH SUBACROMIAL DECOMPRESSION AND BICEP TENDON REPAIR Right 12/14/2018   Procedure: SHOULDER ARTHROSCOPY WITH EBRIDEMENT, DECOMPRESSION, DISTAL CLAVICLE EXCISION, BICEP TENODESIS AND DEBRIDEMENT, AND REPAIR OF ROTATOR CUFF TEAR;  Surgeon: Christena Flake, MD;  Location: [facility] ORS;  Service: Orthopedics;  Laterality:  Right;   SHOULDER ARTHROSCOPY WITH SUBACROMIAL DECOMPRESSION, ROTATOR CUFF REPAIR AND BICEP TENDON REPAIR Left 02/12/2021   Procedure: LEFT SHOULDER ARTHROSCOPY WITH DEBRIDEMENT, DECOMPRESSION, ROTATOR CUFF REPAIR, BICEPS TENODESIS, AND DISTAL CLAVICLE EXCISION;  Surgeon: Christena Flake, MD;  Location: [facility] ORS;  Service: Orthopedics;  Laterality: Left;   TONSILLECTOMY     Family History  Problem Relation Age of Onset   Colon polyps Mother    Stroke Father    Arthritis Other    Diabetes Other        1st degree relative   Stroke Other        F 1st degree relative <60; M 1st degree relative <50   Colon cancer Neg Hx    Stomach cancer Neg Hx    Esophageal cancer Neg Hx    Social History   Tobacco Use   Smoking status: Never   Smokeless tobacco: Former    Types: Snuff   Tobacco comments:    occasionally  Vaping Use   Vaping Use: Never used  Substance Use Topics   Alcohol use: Not Currently    Alcohol/week: 0.0 standard drinks of alcohol   Drug use: Never   Current Outpatient Medications  Medication Sig Dispense Refill   acetaminophen (TYLENOL) 500 MG tablet Take 1,000 mg by mouth every 6 (six) hours as needed for moderate  pain.      cyanocobalamin (VITAMIN B12) 1000 MCG/ML injection Inject 1 mL (1,000 mcg total) into the muscle every 30 (thirty) days. 3 mL 1   diazepam (VALIUM) 5 MG tablet TAKE 1 TABLET BY MOUTH EVERY 8 HOURS AS NEEDED FOR ANXIETY 30 tablet 0   diltiazem (CARDIZEM CD) 180 MG 24 hr capsule TAKE 1 CAPSULE BY MOUTH EVERY DAY 90 capsule 3   flecainide (TAMBOCOR) 100 MG tablet TAKE 1 TABLET BY MOUTH 2 (TWO) TIMES DAILY. 180 tablet 3   losartan (COZAAR) 25 MG tablet TAKE 1 TABLET (25 MG TOTAL) BY MOUTH DAILY. 90 tablet 3   SYRINGE-NEEDLE, DISP, 3 ML (B-D 3CC LUER-LOK SYR 22GX1") 22G X 1" 3 ML MISC USE TO INJECT B 12 INJECTION     TURMERIC PO Take 1,000 mg by mouth 2 (two) times daily.     vedolizumab (ENTYVIO) 300 MG injection Inject 300 mg into the vein every 8  (eight) weeks. 1 each 0   vitamin C (ASCORBIC ACID) 500 MG tablet Take 500 mg by mouth 2 (two) times daily.     Vitamin D, Cholecalciferol, 10 MCG (400 UNIT) CAPS Take 400 Units by mouth 2 (two) times daily.     No current facility-administered medications for this visit.   Allergies  Allergen Reactions   Biaxin [Clarithromycin] Other (See Comments)    Pt has history of arrhythmias   Other Other (See Comments)   Penicillins Rash    Did it involve swelling of the face/tongue/throat, SOB, or low BP? Unknown Did it involve sudden or severe rash/hives, skin peeling, or any reaction on the inside of your mouth or nose? Unknown Did you need to seek medical attention at a hospital or doctor's office? Unknown When did it last happen?      childhood allergy If all above answers are "NO", may proceed with cephalosporin use.      Review of Systems: All systems reviewed and negative except where noted in HPI.   Lab Results  Component Value Date   WBC 7.0 08/25/2022   HGB 13.4 08/25/2022   HCT 41.5 08/25/2022   MCV 82.2 08/25/2022   PLT 286.0 08/25/2022   Lab Results  Component Value Date   CREATININE 0.86 08/25/2022   BUN 10 08/25/2022   NA 140 08/25/2022   K 4.4 08/25/2022   CL 105 08/25/2022   CO2 29 08/25/2022    Lab Results  Component Value Date   ALT 17 08/25/2022   AST 14 08/25/2022   ALKPHOS 41 08/25/2022   BILITOT 0.4 08/25/2022     Physical Exam: BP 120/76   Pulse (!) 57   Ht 6\' 2"  (1.88 m)   Wt (!) 340 lb (154.2 kg)   BMI 43.65 kg/m  Constitutional: Pleasant,well-developed, male in no acute distress. Neurological: Alert and oriented to person place and time. Skin: Skin is warm and dry. No rashes noted. Psychiatric: Normal mood and affect. Behavior is normal.   ASSESSMENT: 57 y.o. male here for assessment of the following  1. 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 Adrenal adenoma, unspecified laterality   3. Vitamin D deficiency     Ulcerative pancolitis, failed mesalamine regimens over the years.  Transitioned to [facility] over the past year and has had an excellent clinical response.  His labs from October looked good, no anemia.  Diarrhea has resolved, now having some constipation.  Recommend adding Citrucel once daily.  He is agreeable.  We discussed ulcerative colitis in  general, goals of therapy are clinical remission and reduction of his risk from colon cancer over time.  His fecal calprotectin has normalized.  I recommend a follow-up colonoscopy now that he has been on Entyvio for several months.  We discussed timing of this.  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  We will have him come back for colonoscopy then.  Discussed risks benefits and he wants to proceed.  Otherwise on vitamin D supplementation, will check level today to make sure adequate and dose adjust as needed.  Otherwise again relayed findings of his last CT scan with him.  We previously had recommended follow-up with his urologist or primary care, consideration for hormonal testing versus interval imaging versus surgical evaluation.  He has not done so yet.  His last CT scan was last year.  I offered him a CT scan to assess for interval changes, he declines for now.  He really wishes to discuss this further with his urologist.  He is not scheduled to see his urologist till September, I recommend he contact them now to schedule an appointment to see if they would want to do anything in the interim.  He agrees  PLAN: - continue Entyvio every 8 weeks - add Citrucel once daily for regularity - Colonoscopy for surveillance at the [facility] - he is requesting August / Sept - follow up with Urology about adrenal gland findings - he declined CT today - lab for vitamin D 25 [place] level  Harlin Rain, MD [facility]astroenter[facility]

## 2023-03-14 ENCOUNTER — Other Ambulatory Visit: Payer: Self-pay | Admitting: Gastroenterology

## 2023-03-20 ENCOUNTER — Ambulatory Visit (INDEPENDENT_AMBULATORY_CARE_PROVIDER_SITE_OTHER): Payer: 59

## 2023-03-20 VITALS — BP 128/82 | HR 65 | Temp 98.1°F | Resp 18 | Ht 74.0 in | Wt 342.2 lb

## 2023-03-20 DIAGNOSIS — K51318 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: Secondary | ICD-10-CM | POA: Diagnosis not present

## 2023-03-20 MED ORDER — VEDOLIZUMAB 300 MG IV SOLR
300.0000 mg | Freq: Once | INTRAVENOUS | Status: AC
  Administered 2023-03-20: 300 mg via INTRAVENOUS
  Filled 2023-03-20: qty 5

## 2023-03-20 NOTE — Progress Notes (Signed)
Diagnosis: Crohn's Disease  Provider:  Chilton Greathouse MD  Procedure: IV Infusion  IV Type: Peripheral, IV Location: R Antecubital  Entyvio (Vedolizumab), Dose: 300 mg  Infusion Start Time: 0951  Infusion Stop Time: 1024  Post Infusion IV Care: Peripheral IV Discontinued  Discharge: Condition: Good, Destination: Home . AVS Declined  Performed by:  Adriana Mccallum, RN

## 2023-05-12 DIAGNOSIS — J302 Other seasonal allergic rhinitis: Secondary | ICD-10-CM | POA: Diagnosis not present

## 2023-05-12 DIAGNOSIS — J018 Other acute sinusitis: Secondary | ICD-10-CM | POA: Diagnosis not present

## 2023-05-13 DIAGNOSIS — M7702 Medial epicondylitis, left elbow: Secondary | ICD-10-CM | POA: Diagnosis not present

## 2023-05-15 ENCOUNTER — Ambulatory Visit (INDEPENDENT_AMBULATORY_CARE_PROVIDER_SITE_OTHER): Payer: 59

## 2023-05-15 VITALS — BP 151/78 | HR 64 | Temp 98.3°F | Resp 18 | Ht 74.0 in | Wt 341.2 lb

## 2023-05-15 DIAGNOSIS — K51318 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: Secondary | ICD-10-CM | POA: Diagnosis not present

## 2023-05-15 MED ORDER — VEDOLIZUMAB 300 MG IV SOLR
300.0000 mg | Freq: Once | INTRAVENOUS | Status: AC
  Administered 2023-05-15: 300 mg via INTRAVENOUS
  Filled 2023-05-15: qty 5

## 2023-05-15 NOTE — Progress Notes (Signed)
Diagnosis: Ulcerative Colitis  Provider:  Chilton Greathouse MD  Procedure: IV Infusion  IV Type: Peripheral, IV Location: R Antecubital  Entyvio (Vedolizumab), Dose: 300 mg  Infusion Start Time: 1004  Infusion Stop Time: 1040  Post Infusion IV Care: Peripheral IV Discontinued  Discharge: Condition: Good, Destination: Home . AVS Declined  Performed by:  Garnette Czech, RN

## 2023-05-25 ENCOUNTER — Telehealth: Payer: Self-pay

## 2023-05-25 ENCOUNTER — Other Ambulatory Visit: Payer: Self-pay | Admitting: Pharmacy Technician

## 2023-05-28 NOTE — Telephone Encounter (Signed)
Reached out to patient to set up follow up visit with provider to discuss chronic conditions.  Telephone encounter attempt : 2   A HIPAA compliant voice message was left requesting a return call.  Instructed patient to call office or to call me at [phone].  Elijio Miles [facility]ist

## 2023-06-08 ENCOUNTER — Encounter: Payer: Self-pay | Admitting: Gastroenterology

## 2023-06-08 ENCOUNTER — Ambulatory Visit (AMBULATORY_SURGERY_CENTER): Payer: 59

## 2023-06-08 VITALS — Ht 74.0 in | Wt 343.0 lb

## 2023-06-08 DIAGNOSIS — K5100 Ulcerative (chronic) pancolitis without complications: Secondary | ICD-10-CM

## 2023-06-08 DIAGNOSIS — M7702 Medial epicondylitis, left elbow: Secondary | ICD-10-CM | POA: Diagnosis not present

## 2023-06-08 MED ORDER — NA SULFATE-K SULFATE-MG SULF 17.5-3.13-1.6 GM/177ML PO SOLN
1.0000 | Freq: Once | ORAL | 0 refills | Status: AC
Start: 2023-06-08 — End: 2023-06-08

## 2023-06-20 ENCOUNTER — Encounter: Payer: Self-pay | Admitting: Certified Registered Nurse Anesthetist

## 2023-06-22 ENCOUNTER — Encounter: Payer: Self-pay | Admitting: Gastroenterology

## 2023-06-22 ENCOUNTER — Ambulatory Visit (AMBULATORY_SURGERY_CENTER): Payer: 59 | Admitting: Gastroenterology

## 2023-06-22 VITALS — BP 120/75 | HR 61 | Temp 97.7°F | Resp 16 | Ht 74.0 in | Wt 343.0 lb

## 2023-06-22 DIAGNOSIS — K529 Noninfective gastroenteritis and colitis, unspecified: Secondary | ICD-10-CM | POA: Diagnosis not present

## 2023-06-22 DIAGNOSIS — K6389 Other specified diseases of intestine: Secondary | ICD-10-CM | POA: Diagnosis not present

## 2023-06-22 DIAGNOSIS — K5100 Ulcerative (chronic) pancolitis without complications: Secondary | ICD-10-CM | POA: Diagnosis not present

## 2023-06-22 DIAGNOSIS — D122 Benign neoplasm of ascending colon: Secondary | ICD-10-CM

## 2023-06-22 DIAGNOSIS — I2510 Atherosclerotic heart disease of native coronary artery without angina pectoris: Secondary | ICD-10-CM | POA: Diagnosis not present

## 2023-06-22 HISTORY — PX: COLONOSCOPY: SHX174

## 2023-06-22 MED ORDER — SODIUM CHLORIDE 0.9 % IV SOLN: 500.0000 mL | Freq: Once | INTRAVENOUS | Status: DC

## 2023-06-22 NOTE — Progress Notes (Signed)
Called to room to assist during endoscopic procedure.  Patient ID and intended procedure confirmed with present staff. Received instructions for my participation in the procedure from the performing physician.  

## 2023-06-22 NOTE — Op Note (Signed)
[facility] Patient Name: Brandon [name] Procedure Date: 06/22/2023 9:15 AM MRN: [id number] Endoscopist: Viviann Spare P. Adela Lank , MD, [id number] Age: 57 Referring MD:  Date of Birth: Apr 30, 1966 Gender: Male Account #: [id number] Procedure:                Colonoscopy Indications:              Disease activity assessment of chronic ulcerative                            pancolitis, Assess therapeutic response to therapy                            of chronic ulcerative pancolitis - on Entyvio since                            06/2022 and clinically doing well on the regimen Medicines:                Monitored Anesthesia Care Procedure:                Pre-Anesthesia Assessment:                           - Prior to the procedure, a History and Physical                            was performed, and patient medications and                            allergies were reviewed. The patient's tolerance of                            previous anesthesia was also reviewed. The risks                            and benefits of the procedure and the sedation                            options and risks were discussed with the patient.                            All questions were answered, and informed consent                            was obtained. Prior Anticoagulants: The patient has                            taken no anticoagulant or antiplatelet agents. ASA                            Grade Assessment: III - A patient with severe                            systemic disease. After reviewing the risks and  benefits, the patient was deemed in satisfactory                            condition to undergo the procedure.                           After obtaining informed consent, the colonoscope                            was passed under direct vision. Throughout the                            procedure, the patient's blood pressure, pulse, and                             oxygen saturations were monitored continuously. The                            CF [id number] #[id number] was introduced through the anus                            and advanced to the the cecum, identified by                            appendiceal orifice and ileocecal valve. The                            colonoscopy was performed without difficulty. The                            patient tolerated the procedure well. The quality                            of the bowel preparation was adequate. The                            ileocecal valve, appendiceal orifice, and rectum                            were photographed. Scope In: 9:25:38 AM Scope Out: 9:43:59 AM Scope Withdrawal Time: 0 hours 15 minutes 1 second  Total Procedure Duration: 0 hours 18 minutes 21 seconds  Findings:                 Skin tags were found on perianal exam.                           A diminutive polyp was found in the ascending                            colon. The polyp was sessile. The polyp was removed                            with a cold biopsy forceps. Resection and retrieval  were complete.                           Multiple small-mouthed diverticula were found in                            the sigmoid colon.                           Internal hemorrhoids were found during retroflexion.                           The exam was otherwise without abnormality.                           Biopsies were taken with a cold forceps for                            ulcerative colitis surveillance. These biopsy                            specimens were sent to Pathology. Complications:            No immediate complications. Estimated blood loss:                            Minimal. Estimated Blood Loss:     Estimated blood loss was minimal. Impression:               - Perianal skin tags found on perianal exam.                           - One diminutive polyp in the ascending colon,                             removed with a cold biopsy forceps. Resected and                            retrieved.                           - Diverticulosis in the sigmoid colon.                           - Internal hemorrhoids.                           - The examination was otherwise normal.                           - Biopsies for surveillance were taken.                           Overall, clinical and endoscopic improvement noted                            on Entyvio - no overt inflammation appreciated. Recommendation:           -  Patient has a contact number available for                            emergencies. The signs and symptoms of potential                            delayed complications were discussed with the                            patient. Return to normal activities tomorrow.                            Written discharge instructions were provided to the                            patient.                           - Resume previous diet.                           - Continue present medications.                           - Await pathology results. Viviann Spare P. Parisa Pinela, MD 06/22/2023 9:48:24 AM This report has been signed electronically.

## 2023-06-22 NOTE — Progress Notes (Signed)
Report given to PACU, vss 

## 2023-06-22 NOTE — Progress Notes (Signed)
[facility] History and Physical   Primary Care Physician:  Hannah Beat, MD   Reason for Procedure:   Ulcerative colitis  Plan:    colonoscopy     HPI: Brandon [name] is a 57 y.o. male  here for colonoscopy  surveillance - history of ulcerative colitis on Entyvio, clinically doing well. Has failed mesalamine. Here for reassessment of disease activity.   Patient denies any bowel symptoms at this time. No family history of colon cancer known. Otherwise feels well without any cardiopulmonary symptoms.   I have discussed risks / benefits of anesthesia and endoscopic procedure with Haywood Filler and they wish to proceed with the exams as outlined today.    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   Foraminal stenosis of cervical region    GERD (gastroesophageal reflux disease)    History of kidney stones    Hypertension    Morbid obesity with BMI of 40.0-44.9, adult (HCC)    PVC (premature ventricular contraction)    Ulcerative colitis (HCC) 04/28/2018   Ventricular ectopy     Past Surgical History:  Procedure Laterality Date   CARDIAC CATHETERIZATION     COLONOSCOPY  05/2014   COLONOSCOPY WITH PROPOFOL N/A 04/20/2018   Procedure: COLONOSCOPY WITH PROPOFOL;  Surgeon: Christena Deem, MD;  Location: [facility] ENDOSCOPY;  Service: Endoscopy;  Laterality: N/A;   COLONOSCOPY WITH PROPOFOL N/A 06/20/2019   Procedure: COLONOSCOPY WITH PROPOFOL;  Surgeon: Christena Deem, MD;  Location: [facility] ENDOSCOPY;  Service: Endoscopy;  Laterality:  N/A;   CYSTOSCOPY/URETEROSCOPY/HOLMIUM LASER/STENT PLACEMENT Right 07/22/2022   Procedure: CYSTOSCOPY/URETEROSCOPY/HOLMIUM LASER/STENT PLACEMENT;  Surgeon: Riki Altes, MD;  Location: [facility] ORS;  Service: Urology;  Laterality: Right;   FOOT SURGERY     left foot   kidney stone removal      SHOULDER ARTHROSCOPY WITH SUBACROMIAL DECOMPRESSION AND BICEP TENDON REPAIR Right 12/14/2018   Procedure: SHOULDER ARTHROSCOPY WITH EBRIDEMENT, DECOMPRESSION, DISTAL CLAVICLE EXCISION, BICEP TENODESIS AND DEBRIDEMENT, AND REPAIR OF ROTATOR CUFF TEAR;  Surgeon: Christena Flake, MD;  Location: [facility] ORS;  Service: Orthopedics;  Laterality: Right;   SHOULDER ARTHROSCOPY WITH SUBACROMIAL DECOMPRESSION, ROTATOR CUFF REPAIR AND BICEP TENDON REPAIR Left 02/12/2021   Procedure: LEFT SHOULDER ARTHROSCOPY WITH DEBRIDEMENT, DECOMPRESSION, ROTATOR CUFF REPAIR, BICEPS TENODESIS, AND DISTAL CLAVICLE EXCISION;  Surgeon: Christena Flake, MD;  Location: [facility] ORS;  Service: Orthopedics;  Laterality: Left;   TONSILLECTOMY      Prior to Admission medications   Medication Sig Start Date End Date Taking? Authorizing Provider  acetaminophen (TYLENOL) 500 MG tablet Take 1,000 mg by mouth every 6 (six) hours as needed for moderate pain.    Yes [provider]  diltiazem (CARDIZEM CD) 180 MG 24 hr capsule TAKE 1 CAPSULE BY MOUTH EVERY DAY 08/25/22  Yes Gollan, Tollie Pizza, MD  flecainide (TAMBOCOR) 100 MG tablet TAKE 1 TABLET BY MOUTH 2 (TWO) TIMES DAILY. 08/13/22  Yes Gollan, Tollie Pizza, MD  fluticasone (FLONASE) 50 MCG/ACT nasal spray Place 2 sprays into both nostrils daily. 05/13/23  Yes  [provider]  losartan (COZAAR) 25 MG tablet TAKE 1 TABLET (25 MG TOTAL) BY MOUTH DAILY. 08/25/22  Yes Gollan, Tollie Pizza, MD  TURMERIC PO Take 1,000 mg by mouth 2 (two) times daily.   Yes [provider]  Vitamin D, Cholecalciferol, 10 MCG (400 UNIT) CAPS Take 400 Units by mouth 2 (two) times daily.   Yes [provider]  cyanocobalamin (VITAMIN B12) 1000 MCG/ML injection INJECT 1 ML (1,000 MCG TOTAL) INTO THE MUSCLE EVERY 30 DAYS. 03/16/23   Raffaele Derise, Willaim Rayas, MD  diazepam (VALIUM) 5 MG tablet TAKE 1 TABLET BY MOUTH EVERY 8 HOURS AS NEEDED FOR ANXIETY 09/17/22   Copland, Karleen Hampshire, MD  methylcellulose (CITRUCEL) oral powder Take 1 packet by mouth daily. 03/04/23   Everett Ricciardelli, Willaim Rayas, MD  SYRINGE-NEEDLE, DISP, 3 ML (B-D 3CC LUER-LOK SYR 22GX1") 22G X 1" 3 ML MISC USE TO INJECT B 12 INJECTION 12/23/20   [provider]  vedolizumab (ENTYVIO) 300 MG injection Inject 300 mg into the vein every 8 (eight) weeks. 07/10/22   Shaana Acocella, Willaim Rayas, MD  vitamin C (ASCORBIC ACID) 500 MG tablet Take 500 mg by mouth 2 (two) times daily.    [provider]    Current Outpatient Medications  Medication Sig Dispense Refill   acetaminophen (TYLENOL) 500 MG tablet Take 1,000 mg by mouth every 6 (six) hours as needed for moderate pain.      diltiazem (CARDIZEM CD) 180 MG 24 hr capsule TAKE 1 CAPSULE BY MOUTH EVERY DAY 90 capsule 3   flecainide (TAMBOCOR) 100 MG tablet TAKE 1 TABLET BY MOUTH 2 (TWO) TIMES DAILY. 180 tablet 3   fluticasone (FLONASE) 50 MCG/ACT nasal spray Place 2 sprays into both nostrils daily.     losartan (COZAAR) 25 MG tablet TAKE 1 TABLET (25 MG TOTAL) BY MOUTH DAILY. 90 tablet 3   TURMERIC PO Take 1,000 mg by mouth 2 (two) times daily.     Vitamin D, Cholecalciferol, 10 MCG (400 UNIT) CAPS Take 400 Units by mouth 2 (two) times daily.     cyanocobalamin (VITAMIN B12) 1000 MCG/ML injection INJECT 1 ML (1,000 MCG TOTAL) INTO THE MUSCLE EVERY 30 DAYS. 1 mL 5   diazepam (VALIUM) 5 MG tablet TAKE 1 TABLET BY MOUTH EVERY 8 HOURS AS NEEDED FOR ANXIETY 30 tablet 0   methylcellulose (CITRUCEL) oral powder Take 1 packet by mouth daily.     SYRINGE-NEEDLE, DISP, 3 ML (B-D 3CC LUER-LOK SYR 22GX1") 22G X 1" 3 ML MISC USE TO INJECT B 12 INJECTION     vedolizumab (ENTYVIO) 300 MG injection  Inject 300 mg into the vein every 8 (eight) weeks. 1 each 0   vitamin C (ASCORBIC ACID) 500 MG tablet Take 500 mg by mouth 2 (two) times daily.     Current Facility-Administered Medications  Medication Dose Route Frequency Provider Last Rate Last Admin   0.9 %  sodium chloride infusion  500 mL Intravenous Once Laramie Meissner, Willaim Rayas, MD        Allergies as of 06/22/2023 - Review Complete 06/22/2023  Allergen Reaction Noted   Biaxin [clarithromycin] Other (See Comments) 01/31/2013   Other Other (See Comments) 08/04/2022   Penicillins Rash 06/07/2009    Family History  Problem Relation Age of Onset   Colon polyps Mother    Stroke Father    Arthritis Other    Diabetes Other        1st degree relative   Stroke Other  F 1st degree relative <60; M 1st degree relative <50   Colon cancer Neg Hx    Stomach cancer Neg Hx    Esophageal cancer Neg Hx    Rectal cancer Neg Hx     Social History   Socioeconomic History   Marital status: Single    Spouse name: Not on file   Number of children: Not on file   Years of education: Not on file   Highest education level: Not on file  Occupational History   Occupation: self employed    Employer: [employer]  Tobacco Use   Smoking status: Never   Smokeless tobacco: Former    Types: Snuff   Tobacco comments:    occasionally  Vaping Use   Vaping status: Never Used  Substance and Sexual Activity   Alcohol use: Not Currently    Alcohol/week: 0.0 standard drinks of alcohol   Drug use: Never   Sexual activity: Not on file  Other Topics Concern   Not on file  Social History Narrative   Does not do regular exercise.    Lives alone   Social Determinants of Health   Financial Resource Strain: Not on file  Food Insecurity: Not on file  Transportation Needs: Not on file  Physical Activity: Not on file  Stress: Not on file  Social Connections: Not on file  Intimate Partner Violence: Not on file    Review of Systems: All other  review of systems negative except as mentioned in the HPI.  Physical Exam: Vital signs BP 138/78   Pulse 68   Temp 97.7 F (36.5 C) (Temporal)   Ht 6\' 2"  (1.88 m)   Wt (!) 343 lb (155.6 kg)   SpO2 97%   BMI 44.04 kg/m   General:   Alert,  Well-developed, pleasant and cooperative in NAD Lungs:  Clear throughout to auscultation.   Heart:  Regular rate and rhythm Abdomen:  Soft, nontender and nondistended.   Neuro/Psych:  Alert and cooperative. Normal mood and affect. A and O x 3  Harlin Rain, MD [facility]

## 2023-06-22 NOTE — Progress Notes (Signed)
Pt's states no medical or surgical changes since previsit or office visit. 

## 2023-06-22 NOTE — Patient Instructions (Signed)
Handouts provided about hemorrhoids, diverticulosis and polyps. Resume previous diet.  Continue present medications.  Await pathology results.  YOU HAD AN ENDOSCOPIC PROCEDURE TODAY AT THE [facility]:   Refer to the procedure report that was given to you for any specific questions about what was found during the examination.  If the procedure report does not answer your questions, please call your gastroenterologist to clarify.  If you requested that your care partner not be given the details of your procedure findings, then the procedure report has been included in a sealed envelope for you to review at your convenience later.  YOU SHOULD EXPECT: Some feelings of bloating in the abdomen. Passage of more gas than usual.  Walking can help get rid of the air that was put into your GI tract during the procedure and reduce the bloating. If you had a lower endoscopy (such as a colonoscopy or flexible sigmoidoscopy) you may notice spotting of blood in your stool or on the toilet paper. If you underwent a bowel prep for your procedure, you may not have a normal bowel movement for a few days.  Please Note:  You might notice some irritation and congestion in your nose or some drainage.  This is from the oxygen used during your procedure.  There is no need for concern and it should clear up in a day or so.  SYMPTOMS TO REPORT IMMEDIATELY:  Following lower endoscopy (colonoscopy or flexible sigmoidoscopy):  Excessive amounts of blood in the stool  Significant tenderness or worsening of abdominal pains  Swelling of the abdomen that is new, acute  Fever of 100F or higher   For urgent or emergent issues, a gastroenterologist can be reached at any hour by calling [phone]. Do not use MyChart messaging for urgent concerns.    DIET:  We do recommend a small meal at first, but then you may proceed to your regular diet.  Drink plenty of fluids but you should avoid alcoholic beverages for 24  hours.  ACTIVITY:  You should plan to take it easy for the rest of today and you should NOT DRIVE or use heavy machinery until tomorrow (because of the sedation medicines used during the test).    FOLLOW UP: Our staff will call the number listed on your records the next business day following your procedure.  We will call around 7:15- 8:00 am to check on you and address any questions or concerns that you may have regarding the information given to you following your procedure. If we do not reach you, we will leave a message.     If any biopsies were taken you will be contacted by phone or by letter within the next 1-3 weeks.  Please call us at [phone] if you have not heard about the biopsies in 3 weeks.    SIGNATURES/CONFIDENTIALITY: You and/or your care partner have signed paperwork which will be entered into your electronic medical record.  These signatures attest to the fact that that the information above on your After Visit Summary has been reviewed and is understood.  Full responsibility of the confidentiality of this discharge information lies with you and/or your care-partner.

## 2023-06-23 ENCOUNTER — Telehealth: Payer: Self-pay | Admitting: *Deleted

## 2023-06-23 NOTE — Telephone Encounter (Signed)
  Follow up Call-     06/22/2023    8:09 AM 06/06/2022    8:03 AM  Call back number  Post procedure Call Back phone  # [phone]  Permission to leave phone message Yes Yes     Patient questions:  Do you have a fever, pain , or abdominal swelling? No. Pain Score  0 *  Have you tolerated food without any problems? Yes.    Have you been able to return to your normal activities? Yes.    Do you have any questions about your discharge instructions: Diet   No. Medications  No. Follow up visit  No.  Do you have questions or concerns about your Care? No.  Actions: * If pain score is 4 or above: No action needed, pain <4.

## 2023-07-10 ENCOUNTER — Ambulatory Visit (INDEPENDENT_AMBULATORY_CARE_PROVIDER_SITE_OTHER): Payer: 59 | Admitting: *Deleted

## 2023-07-10 VITALS — BP 130/81 | HR 9 | Temp 97.9°F | Resp 16 | Ht 74.0 in | Wt 347.0 lb

## 2023-07-10 DIAGNOSIS — K51318 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: Secondary | ICD-10-CM

## 2023-07-10 MED ORDER — VEDOLIZUMAB 300 MG IV SOLR
300.0000 mg | Freq: Once | INTRAVENOUS | Status: AC
  Administered 2023-07-10: 300 mg via INTRAVENOUS
  Filled 2023-07-10: qty 5

## 2023-07-10 NOTE — Progress Notes (Signed)
Diagnosis: Crohn's Disease  Provider:  Chilton Greathouse MD  Procedure: IV Infusion  IV Type: Peripheral, IV Location: R Antecubital  Entyvio (Vedolizumab), Dose: 300 mg  Infusion Start Time: 1020  am  Infusion Stop Time: 1059  am  Post Infusion IV Care: Observation period completed and Peripheral IV Discontinued  Discharge: Condition: Good, Destination: Home . AVS Declined  Performed by:  Forrest Moron, RN

## 2023-07-14 ENCOUNTER — Other Ambulatory Visit: Payer: Self-pay | Admitting: Cardiovascular Disease

## 2023-07-14 ENCOUNTER — Telehealth: Payer: Self-pay | Admitting: Pharmacy Technician

## 2023-07-14 NOTE — Telephone Encounter (Signed)
Auth Submission: APPROVED Site of care: Site of care: [facility] Payer: [employer] Medication & CPT/J Code(s) submitted: Entyvio (Vedolizumab) [id number] Route of submission (phone, fax, portal): PORTAL Phone # Fax # Auth type: Buy/Bill PB Units/visits requested:  Reference number: [id number] Approval from: 06/07/23 to 06/05/24

## 2023-07-22 ENCOUNTER — Ambulatory Visit
Admission: RE | Admit: 2023-07-22 | Discharge: 2023-07-22 | Disposition: A | Discharge status: Home / Self Care | Payer: 59 | Attending: Urology | Admitting: Urology

## 2023-07-22 ENCOUNTER — Ambulatory Visit
Admission: RE | Admit: 2023-07-22 | Discharge: 2023-07-22 | Disposition: A | Discharge status: Home / Self Care | Payer: 59 | Source: Ambulatory Visit | Attending: Urology | Admitting: Urology

## 2023-07-22 DIAGNOSIS — N201 Calculus of ureter: Secondary | ICD-10-CM

## 2023-07-22 DIAGNOSIS — N200 Calculus of kidney: Secondary | ICD-10-CM | POA: Diagnosis not present

## 2023-07-22 DIAGNOSIS — I878 Other specified disorders of veins: Secondary | ICD-10-CM | POA: Diagnosis not present

## 2023-07-23 ENCOUNTER — Ambulatory Visit: Payer: 59 | Admitting: Urology

## 2023-07-23 ENCOUNTER — Encounter: Payer: Self-pay | Admitting: Urology

## 2023-07-23 VITALS — BP 138/70 | HR 74 | Ht 74.0 in | Wt 340.0 lb

## 2023-07-23 DIAGNOSIS — E278 Other specified disorders of adrenal gland: Secondary | ICD-10-CM

## 2023-07-23 DIAGNOSIS — Z09 Encounter for follow-up examination after completed treatment for conditions other than malignant neoplasm: Secondary | ICD-10-CM

## 2023-07-23 DIAGNOSIS — N201 Calculus of ureter: Secondary | ICD-10-CM

## 2023-07-23 DIAGNOSIS — Z87442 Personal history of urinary calculi: Secondary | ICD-10-CM | POA: Diagnosis not present

## 2023-07-23 DIAGNOSIS — D1779 Benign lipomatous neoplasm of other sites: Secondary | ICD-10-CM

## 2023-07-23 NOTE — Progress Notes (Signed)
I,Brandon [name],acting as a scribe for Brandon Altes, MD.,have documented all relevant documentation on the behalf of Brandon Altes, MD,as directed by  Brandon Altes, MD while in the presence of Brandon Altes, MD.  07/23/2023 12:57 PM   Brandon [name] 1966-09-17 [id number]  Referring provider: Hannah Beat, MD [address] [place],  [place] [address]  Chief Complaint  Patient presents with   Nephrolithiasis   Urologic History: 1. Personal history of urinary calculi. Ureteroscopic removal of 5mm distal ureteral calculus in 07/2022. Stone analysis 100% calcium oxalate monohydrate.  2. Adrenal mass CT 05/2022 with a 4.8 by 4.3 left adrenal mass, fat density felt consistent with a lipid rich benign adenoma. 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  HPI: Brandon [name] is a 57 y.o.  male presents today for annual follow-up.  No complaints since last year's visit. Nocturia x 3-4.  He drinks fluids in the evening and states his symptoms are not bothersome Denies flank, abdominal, and pelvic pain. No dysuria or gross hematuria. He states he was not contacted by endocrinology for evaluation of adrenal masses. KUB performed today and there are no calcifications identified. Suspicious for recurrent urinary tract stones.   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   Foraminal stenosis of cervical region    GERD (gastroesophageal reflux disease)    History of kidney stones    Hypertension    Morbid obesity with BMI  of 40.0-44.9, adult (HCC)    PVC (premature ventricular contraction)    Ulcerative colitis (HCC) 04/28/2018   Ventricular ectopy     Surgical History: Past Surgical History:  Procedure Laterality Date   CARDIAC CATHETERIZATION     COLONOSCOPY  05/2014   COLONOSCOPY  06/22/2023   Brandon [name] at [facility]   COLONOSCOPY WITH PROPOFOL N/A 04/20/2018   Procedure: COLONOSCOPY WITH PROPOFOL;  Surgeon: Brandon Deem, MD;  Location: [facility] ENDOSCOPY;  Service: Endoscopy;  Laterality: N/A;   COLONOSCOPY WITH PROPOFOL N/A 06/20/2019   Procedure: COLONOSCOPY WITH PROPOFOL;  Surgeon: Brandon Deem, MD;  Location: [facility] ENDOSCOPY;  Service: Endoscopy;  Laterality: N/A;   COLONOSCOPY WITH PROPOFOL  06/2022   CYSTOSCOPY/URETEROSCOPY/HOLMIUM LASER/STENT PLACEMENT Right 07/22/2022   Procedure: CYSTOSCOPY/URETEROSCOPY/HOLMIUM LASER/STENT PLACEMENT;  Surgeon: Brandon Altes, MD;  Location: [facility] ORS;  Service: Urology;  Laterality: Right;   FOOT SURGERY     left foot   kidney stone removal      SHOULDER ARTHROSCOPY WITH SUBACROMIAL DECOMPRESSION AND BICEP TENDON REPAIR Right 12/14/2018   Procedure: SHOULDER ARTHROSCOPY WITH EBRIDEMENT, DECOMPRESSION, DISTAL CLAVICLE EXCISION, BICEP TENODESIS AND DEBRIDEMENT, AND REPAIR OF ROTATOR CUFF TEAR;  Surgeon: Brandon Flake, MD;  Location: [facility] ORS;  Service: Orthopedics;  Laterality: Right;   SHOULDER ARTHROSCOPY WITH SUBACROMIAL DECOMPRESSION, ROTATOR CUFF REPAIR AND BICEP TENDON REPAIR Left 02/12/2021   Procedure: LEFT SHOULDER ARTHROSCOPY WITH DEBRIDEMENT, DECOMPRESSION, ROTATOR CUFF REPAIR, BICEPS TENODESIS, AND DISTAL CLAVICLE EXCISION;  Surgeon: Brandon Flake, MD;  Location: [facility] ORS;  Service: Orthopedics;  Laterality:  Left;   TONSILLECTOMY      Home Medications:  Allergies as of 07/23/2023       Reactions   Biaxin [clarithromycin] Other (See Comments)   Pt has history of arrhythmias   Other Other (See Comments)   Penicillins Rash   Did it  involve swelling of the face/tongue/throat, SOB, or low BP? Unknown Did it involve sudden or severe rash/hives, skin peeling, or any reaction on the inside of your mouth or nose? Unknown Did you need to seek medical attention at a hospital or doctor's office? Unknown When did it last happen?      childhood allergy If all above answers are "NO", may proceed with cephalosporin use.        Medication List        Accurate as of July 23, 2023 12:57 PM. If you have any questions, ask your nurse or doctor.          acetaminophen 500 MG tablet Commonly known as: TYLENOL Take 1,000 mg by mouth every 6 (six) hours as needed for moderate pain.   ascorbic acid 500 MG tablet Commonly known as: VITAMIN C Take 500 mg by mouth 2 (two) times daily.   B-D 3CC LUER-LOK SYR 22GX1" 22G X 1" 3 ML Misc Generic drug: SYRINGE-NEEDLE (DISP) 3 ML USE TO INJECT B 12 INJECTION   Citrucel oral powder Generic drug: methylcellulose Take 1 packet by mouth daily.   cyanocobalamin 1000 MCG/ML injection Commonly known as: VITAMIN B12 INJECT 1 ML (1,000 MCG TOTAL) INTO THE MUSCLE EVERY 30 DAYS.   diazepam 5 MG tablet Commonly known as: VALIUM TAKE 1 TABLET BY MOUTH EVERY 8 HOURS AS NEEDED FOR ANXIETY   diltiazem 180 MG 24 hr capsule Commonly known as: CARDIZEM CD TAKE 1 CAPSULE BY MOUTH EVERY DAY   flecainide 100 MG tablet Commonly known as: TAMBOCOR TAKE 1 TABLET BY MOUTH TWICE A DAY/Due for yearly follow up.  PLEASE CALL OFFICE TO SCHEDULE APPOINTMENT PRIOR TO NEXT REFILL   fluticasone 50 MCG/ACT nasal spray Commonly known as: FLONASE Place 2 sprays into both nostrils daily.   losartan 25 MG tablet Commonly known as: COZAAR TAKE 1 TABLET (25 MG TOTAL) BY MOUTH DAILY.   TURMERIC PO Take 1,000 mg by mouth 2 (two) times daily.   vedolizumab 300 MG injection Commonly known as: ENTYVIO Inject 300 mg into the vein every 8 (eight) weeks.   Vitamin D (Cholecalciferol) 10 MCG (400 UNIT)  Caps Take 400 Units by mouth 2 (two) times daily.        Allergies:  Allergies  Allergen Reactions   Biaxin [Clarithromycin] Other (See Comments)    Pt has history of arrhythmias   Other Other (See Comments)   Penicillins Rash    Did it involve swelling of the face/tongue/throat, SOB, or low BP? Unknown Did it involve sudden or severe rash/hives, skin peeling, or any reaction on the inside of your mouth or nose? Unknown Did you need to seek medical attention at a hospital or doctor's office? Unknown When did it last happen?      childhood allergy If all above answers are "NO", may proceed with cephalosporin use.     Family History: Family History  Problem Relation Age of Onset   Colon polyps Mother    Stroke Father    Arthritis Other    Diabetes Other        1st degree relative   Stroke Other        F  1st degree relative <60; M 1st degree relative <50   Colon cancer Neg Hx    Stomach cancer Neg Hx    Esophageal cancer Neg Hx    Rectal cancer Neg Hx     Social History:  reports that he has never smoked. He has quit using smokeless tobacco.  His smokeless tobacco use included snuff. He reports that he does not currently use alcohol. He reports that he does not use drugs.   Physical Exam: BP 138/70   Pulse 74   Ht 6\' 2"  (1.88 m)   Wt (!) 340 lb (154.2 kg)   BMI 43.65 kg/m   Constitutional:  Alert and oriented, No acute distress. HEENT: [place] AT Respiratory: Normal respiratory effort, no increased work of breathing. Psychiatric: Normal mood and affect.   Pertinent Imaging: KUB performed today was personally reviewed and interpreted. Has not yet been interpreted by radiology.   Assessment & Plan:    1. Personal history of urinary calculi No evidence of recurrence, normal KUB.  2. Bilateral adrenal masses Based on size I have recommended a functional evaluation.  Will check to see why endocrinology appointment was not scheduled and re-send. Discussed  recommendation of surgery for functional adrenal masses. His adrenal masses are also greater than 4 cm and are at increased risk for bleeding.   I have reviewed the above documentation for accuracy and completeness, and I agree with the above.   Brandon Altes, MD  [facility] [address], [address] [place], [place] [address] [phone]

## 2023-07-26 ENCOUNTER — Encounter: Payer: Self-pay | Admitting: Urology

## 2023-07-30 ENCOUNTER — Ambulatory Visit: Payer: 59 | Admitting: Urology

## 2023-08-07 ENCOUNTER — Other Ambulatory Visit: Payer: Self-pay | Admitting: Cardiovascular Disease

## 2023-08-07 NOTE — Telephone Encounter (Signed)
Please contact pt for future appointment. Pt due for 12 month f/u. 

## 2023-08-07 NOTE — Telephone Encounter (Signed)
Pt scheduled on 11/18

## 2023-08-11 ENCOUNTER — Other Ambulatory Visit: Payer: Self-pay | Admitting: Cardiovascular Disease

## 2023-08-19 ENCOUNTER — Encounter: Payer: Self-pay | Admitting: Gastroenterology

## 2023-08-20 ENCOUNTER — Telehealth: Payer: Self-pay | Admitting: *Deleted

## 2023-08-20 DIAGNOSIS — E038 Other specified hypothyroidism: Secondary | ICD-10-CM

## 2023-08-20 DIAGNOSIS — I10 Essential (primary) hypertension: Secondary | ICD-10-CM

## 2023-08-20 DIAGNOSIS — Z125 Encounter for screening for malignant neoplasm of prostate: Secondary | ICD-10-CM

## 2023-08-20 DIAGNOSIS — Z79899 Other long term (current) drug therapy: Secondary | ICD-10-CM

## 2023-08-20 NOTE — Telephone Encounter (Signed)
-----   Message from Alvina Chou sent at 08/18/2023  3:42 PM EDT ----- Regarding: Lab orders for Wed, 10.30.24 Patient is scheduled for CPX labs, please order future labs, Thanks , Camelia Eng

## 2023-09-02 ENCOUNTER — Other Ambulatory Visit: Payer: Self-pay | Admitting: Cardiovascular Disease

## 2023-09-02 ENCOUNTER — Other Ambulatory Visit (INDEPENDENT_AMBULATORY_CARE_PROVIDER_SITE_OTHER): Payer: 59

## 2023-09-02 DIAGNOSIS — Z79899 Other long term (current) drug therapy: Secondary | ICD-10-CM

## 2023-09-02 DIAGNOSIS — I10 Essential (primary) hypertension: Secondary | ICD-10-CM

## 2023-09-02 DIAGNOSIS — Z6841 Body Mass Index (BMI) 40.0 and over, adult: Secondary | ICD-10-CM | POA: Diagnosis not present

## 2023-09-02 DIAGNOSIS — E038 Other specified hypothyroidism: Secondary | ICD-10-CM | POA: Diagnosis not present

## 2023-09-02 DIAGNOSIS — Z125 Encounter for screening for malignant neoplasm of prostate: Secondary | ICD-10-CM

## 2023-09-02 LAB — HEPATIC FUNCTION PANEL
ALT: 21 U/L (ref 0–53)
AST: 15 U/L (ref 0–37)
Albumin: 3.8 g/dL (ref 3.5–5.2)
Alkaline Phosphatase: 46 U/L (ref 39–117)
Bilirubin, Direct: 0.1 mg/dL (ref 0.0–0.3)
Total Bilirubin: 0.4 mg/dL (ref 0.2–1.2)
Total Protein: 6.4 g/dL (ref 6.0–8.3)

## 2023-09-02 LAB — BASIC METABOLIC PANEL
BUN: 13 mg/dL (ref 6–23)
CO2: 30 meq/L (ref 19–32)
Calcium: 8.9 mg/dL (ref 8.4–10.5)
Chloride: 104 meq/L (ref 96–112)
Creatinine, Ser: 0.82 mg/dL (ref 0.40–1.50)
GFR: 96.06 mL/min (ref >60.00)
Glucose, Bld: 86 mg/dL (ref 70–99)
Potassium: 4.1 meq/L (ref 3.5–5.1)
Sodium: 141 meq/L (ref 135–145)

## 2023-09-02 LAB — LIPID PANEL
Cholesterol: 125 mg/dL (ref 0–200)
HDL: 26.9 mg/dL — ABNORMAL LOW (ref >39.00)
LDL Cholesterol: 80 mg/dL (ref 0–99)
NonHDL: 97.96
Total CHOL/HDL Ratio: 5
Triglycerides: 91 mg/dL (ref 0.0–149.0)
VLDL: 18.2 mg/dL (ref 0.0–40.0)

## 2023-09-02 LAB — T3, FREE: T3, Free: 3.6 pg/mL (ref 2.3–4.2)

## 2023-09-02 LAB — CBC WITH DIFFERENTIAL/PLATELET
Basophils Absolute: 0 10*3/uL (ref 0.0–0.1)
Basophils Relative: 0.4 % (ref 0.0–3.0)
Eosinophils Absolute: 0.2 10*3/uL (ref 0.0–0.7)
Eosinophils Relative: 2.5 % (ref 0.0–5.0)
HCT: 43.5 % (ref 39.0–52.0)
Hemoglobin: 14 g/dL (ref 13.0–17.0)
Lymphocytes Relative: 25.6 % (ref 12.0–46.0)
Lymphs Abs: 1.9 10*3/uL (ref 0.7–4.0)
MCHC: 32.3 g/dL (ref 30.0–36.0)
MCV: 87.4 fL (ref 78.0–100.0)
Monocytes Absolute: 0.6 10*3/uL (ref 0.1–1.0)
Monocytes Relative: 7.5 % (ref 3.0–12.0)
Neutro Abs: 4.7 10*3/uL (ref 1.4–7.7)
Neutrophils Relative %: 64 % (ref 43.0–77.0)
Platelets: 266 10*3/uL (ref 150.0–400.0)
RBC: 4.98 Mil/uL (ref 4.22–5.81)
RDW: 15.7 % — ABNORMAL HIGH (ref 11.5–15.5)
WBC: 7.4 10*3/uL (ref 4.0–10.5)

## 2023-09-02 LAB — TSH: TSH: 2.21 u[IU]/mL (ref 0.35–5.50)

## 2023-09-02 LAB — PSA: PSA: 1.43 ng/mL (ref 0.10–4.00)

## 2023-09-02 LAB — T4, FREE: Free T4: 0.81 ng/dL (ref 0.60–1.60)

## 2023-09-02 LAB — HEMOGLOBIN A1C: Hgb A1c MFr Bld: 5.7 % (ref 4.6–6.5)

## 2023-09-04 ENCOUNTER — Ambulatory Visit (INDEPENDENT_AMBULATORY_CARE_PROVIDER_SITE_OTHER): Payer: 59

## 2023-09-04 VITALS — BP 131/79 | HR 67 | Temp 98.0°F | Resp 16 | Ht 74.0 in | Wt 352.6 lb

## 2023-09-04 DIAGNOSIS — K51318 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: Secondary | ICD-10-CM

## 2023-09-04 MED ORDER — VEDOLIZUMAB 300 MG IV SOLR
300.0000 mg | Freq: Once | INTRAVENOUS | Status: AC
  Administered 2023-09-04: 300 mg via INTRAVENOUS
  Filled 2023-09-04: qty 5

## 2023-09-04 NOTE — Progress Notes (Signed)
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 AVS Declined  Performed by:  Rico Ala, LPN

## 2023-09-08 NOTE — Progress Notes (Unsigned)
Brandon Mazzola T. Willoughby Doell, MD, CAQ Sports Medicine [facility] at [facility] [address] [place], [address]  Phone: [phone]  FAX: [phone]  [name] - 57 y.o. male  MRN [id number]  Date of Birth: [birth year]  Date: 09/09/2023  PCP: Hannah Beat, MD  Referral: Hannah Beat, MD  No chief complaint on file.  Patient Care Team: Hannah Beat, MD as PCP - General Brandon [name], Tollie Pizza, MD as PCP - Cardiology (Cardiology) Brandon Iba, MD as Consulting Physician (Cardiology) Subjective:   Brandon [name] is a 57 y.o. pleasant patient who presents with the following:  Preventative Health Maintenance Visit:  Health Maintenance Summary Reviewed and updated, unless pt declines services.  Tobacco History Reviewed. Alcohol: No concerns, no excessive use Exercise Habits: Some activity, rec at least 30 mins 5 times a week STD concerns: no risk or activity to increase risk Drug Use: None  He is a very pleasant gentleman who I have known for many years.  Shingrix Flu Covid booster  Health Maintenance  Topic Date Due   HIV Screening  Never done   Zoster Vaccines- Shingrix (1 of 2) Never done   COVID-19 Vaccine (3 - Pfizer risk series) 03/21/2020   INFLUENZA VACCINE  06/04/2023   Colonoscopy  06/21/2024   DTaP/Tdap/Td (3 - Td or Tdap) 09/01/2032   Hepatitis C Screening  Completed   HPV VACCINES  Aged Out   Immunization History  Administered Date(s) Administered   Influenza Whole 11/15/2009, 07/14/2011   Influenza, Seasonal, Injecte, Preservative Fre 11/08/2012   Influenza,inj,Quad PF,6+ Mos 08/24/2013, 08/16/2014, 08/20/2015, 06/23/2016, 10/08/2017, 08/19/2018, 08/01/2019, 08/27/2020, 09/01/2022   PFIZER Comirnaty(Gray Top)Covid-19 Tri-Sucrose Vaccine 02/01/2020, 02/22/2020   PNEUMOCOCCAL CONJUGATE-20 09/01/2022   Td 12/04/2010   Tdap 09/01/2022   Patient Active Problem List   Diagnosis Date Noted   ATRIAL  FIBRILLATION, PAROXYSMAL 06/07/2009    Priority: High   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) 06/12/2022    Priority: Medium    Essential hypertension 06/16/2013    Priority: Medium    Morbid obesity with BMI of 40.0-44.9, adult (HCC) 06/07/2009    Priority: Medium    Classical migraine 03/03/2012    Priority: Low   Cervical disc disorder with radiculopathy of cervical region 10/05/2013   Carpal tunnel syndrome 10/05/2013   Basal cell carcinoma 06/25/2011   DIVERTICULITIS, COLON 06/07/2009    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   Foraminal stenosis of cervical region    GERD (gastroesophageal reflux disease)    History of kidney stones    Hypertension    Morbid obesity with BMI of 40.0-44.9, adult (HCC)    PVC (premature ventricular contraction)    Ulcerative colitis (HCC) 04/28/2018   Ventricular ectopy     Past Surgical History:  Procedure Laterality Date   CARDIAC CATHETERIZATION     COLONOSCOPY  05/2014   COLONOSCOPY  06/22/2023   Brandon [name] at [facility]   COLONOSCOPY WITH PROPOFOL N/A 04/20/2018   Procedure: COLONOSCOPY WITH PROPOFOL;  Surgeon: Barnetta Chapel  U, MD;  Location: [facility] ENDOSCOPY;  Service: Endoscopy;  Laterality: N/A;   COLONOSCOPY WITH PROPOFOL N/A 06/20/2019   Procedure: COLONOSCOPY WITH PROPOFOL;  Surgeon: Christena Deem, MD;  Location: [facility] ENDOSCOPY;  Service: Endoscopy;  Laterality: N/A;   COLONOSCOPY WITH PROPOFOL  06/2022   CYSTOSCOPY/URETEROSCOPY/HOLMIUM LASER/STENT PLACEMENT Right  07/22/2022   Procedure: CYSTOSCOPY/URETEROSCOPY/HOLMIUM LASER/STENT PLACEMENT;  Surgeon: Riki Altes, MD;  Location: [facility] ORS;  Service: Urology;  Laterality: Right;   FOOT SURGERY     left foot   kidney stone removal      SHOULDER ARTHROSCOPY WITH SUBACROMIAL DECOMPRESSION AND BICEP TENDON REPAIR Right 12/14/2018   Procedure: SHOULDER ARTHROSCOPY WITH EBRIDEMENT, DECOMPRESSION, DISTAL CLAVICLE EXCISION, BICEP TENODESIS AND DEBRIDEMENT, AND REPAIR OF ROTATOR CUFF TEAR;  Surgeon: Christena Flake, MD;  Location: [facility] ORS;  Service: Orthopedics;  Laterality: Right;   SHOULDER ARTHROSCOPY WITH SUBACROMIAL DECOMPRESSION, ROTATOR CUFF REPAIR AND BICEP TENDON REPAIR Left 02/12/2021   Procedure: LEFT SHOULDER ARTHROSCOPY WITH DEBRIDEMENT, DECOMPRESSION, ROTATOR CUFF REPAIR, BICEPS TENODESIS, AND DISTAL CLAVICLE EXCISION;  Surgeon: Christena Flake, MD;  Location: [facility] ORS;  Service: Orthopedics;  Laterality: Left;   TONSILLECTOMY      Family History  Problem Relation Age of Onset   Colon polyps Mother    Stroke Father    Arthritis Other    Diabetes Other        1st degree relative   Stroke Other        F 1st degree relative <60; M 1st degree relative <50   Colon cancer Neg Hx    Stomach cancer Neg Hx    Esophageal cancer Neg Hx    Rectal cancer Neg Hx     Social History   Social History Narrative   Does not do regular exercise.    Lives alone    Past Medical History, Surgical History, Social History, Family History, Problem List, Medications, and Allergies have been reviewed and updated if relevant.  Review of Systems: Pertinent positives are listed above.  Otherwise, a full 14 point review of systems has been done in full and it is negative except where it is noted positive.  Objective:   There were no vitals taken for this visit. Ideal Body Weight:    Ideal Body Weight:   No results found.    09/01/2022    9:50 AM 08/01/2019    2:31 PM 05/17/2018    9:08 AM 04/29/2017    8:49  AM  Depression screen PHQ 2/9  Decreased Interest 0 0 0 0  Down, Depressed, Hopeless 0 0 0 0  PHQ - 2 Score 0 0 0 0     GEN: well developed, well nourished, no acute distress Eyes: conjunctiva and lids normal, PERRLA, EOMI ENT: TM clear, nares clear, oral exam WNL Neck: supple, no lymphadenopathy, no thyromegaly, no JVD Pulm: clear to auscultation and percussion, respiratory effort normal CV: regular rate and rhythm, S1-S2, no murmur, rub or gallop, no bruits, peripheral pulses normal and symmetric, no cyanosis, clubbing, edema or varicosities GI: soft, non-tender; no hepatosplenomegaly, masses; active bowel sounds all quadrants GU: deferred Lymph: no cervical, axillary or inguinal adenopathy MSK: gait normal, muscle tone and strength WNL, no joint swelling, effusions, discoloration, crepitus  SKIN: clear, good turgor, color WNL, no rashes, lesions, or ulcerations Neuro: normal mental status, normal strength, sensation, and motion Psych: alert; oriented to person, place and time, normally interactive and not anxious or depressed in appearance.  All labs reviewed with patient. Results  for orders placed or performed in visit on 09/02/23  Basic metabolic panel  Result Value Ref Range   Sodium 141 135 - 145 mEq/L   Potassium 4.1 3.5 - 5.1 mEq/L   Chloride 104 96 - 112 mEq/L   CO2 30 19 - 32 mEq/L   Glucose, Bld 86 70 - 99 mg/dL   BUN 13 6 - 23 mg/dL   Creatinine, Ser 1.61 0.40 - 1.50 mg/dL   GFR 09.60 >45.40 mL/min   Calcium 8.9 8.4 - 10.5 mg/dL  Hemoglobin J8J  Result Value Ref Range   Hgb A1c MFr Bld 5.7 4.6 - 6.5 %  Hepatic Function Panel  Result Value Ref Range   Total Bilirubin 0.4 0.2 - 1.2 mg/dL   Bilirubin, Direct 0.1 0.0 - 0.3 mg/dL   Alkaline Phosphatase 46 39 - 117 U/L   AST 15 0 - 37 U/L   ALT 21 0 - 53 U/L   Total Protein 6.4 6.0 - 8.3 g/dL   Albumin 3.8 3.5 - 5.2 g/dL  T4, free  Result Value Ref Range   Free T4 0.81 0.60 - 1.60 ng/dL  T3, free  Result  Value Ref Range   T3, Free 3.6 2.3 - 4.2 pg/mL  TSH  Result Value Ref Range   TSH 2.21 0.35 - 5.50 uIU/mL  PSA  Result Value Ref Range   PSA 1.43 0.10 - 4.00 ng/mL  Lipid panel  Result Value Ref Range   Cholesterol 125 0 - 200 mg/dL   Triglycerides 19.1 0.0 - 149.0 mg/dL   HDL 47.82 (L) >95.62 mg/dL   VLDL 13.0 0.0 - 86.5 mg/dL   LDL Cholesterol 80 0 - 99 mg/dL   Total CHOL/HDL Ratio 5    NonHDL 97.96   CBC with Differential/Platelet  Result Value Ref Range   WBC 7.4 4.0 - 10.5 K/uL   RBC 4.98 4.22 - 5.81 Mil/uL   Hemoglobin 14.0 13.0 - 17.0 g/dL   HCT 78.4 69.6 - 29.5 %   MCV 87.4 78.0 - 100.0 fl   MCHC 32.3 30.0 - 36.0 g/dL   RDW 28.4 (H) 13.2 - 44.0 %   Platelets 266.0 150.0 - 400.0 K/uL   Neutrophils Relative % 64.0 43.0 - 77.0 %   Lymphocytes Relative 25.6 12.0 - 46.0 %   Monocytes Relative 7.5 3.0 - 12.0 %   Eosinophils Relative 2.5 0.0 - 5.0 %   Basophils Relative 0.4 0.0 - 3.0 %   Neutro Abs 4.7 1.4 - 7.7 K/uL   Lymphs Abs 1.9 0.7 - 4.0 K/uL   Monocytes Absolute 0.6 0.1 - 1.0 K/uL   Eosinophils Absolute 0.2 0.0 - 0.7 K/uL   Basophils Absolute 0.0 0.0 - 0.1 K/uL    Assessment and Plan:     ICD-10-CM   1. Healthcare maintenance  Z00.00       Health Maintenance Exam: The patient's preventative maintenance and recommended screening tests for an annual wellness exam were reviewed in full today. Brought up to date unless services declined.  Counselled on the importance of diet, exercise, and its role in overall health and mortality. The patient's FH and SH was reviewed, including their home life, tobacco status, and drug and alcohol status.  Follow-up in 1 year for physical exam or additional follow-up below.  Disposition: No follow-ups on file.  No orders of the defined types were placed in this encounter.  There are no discontinued medications. No orders of the defined types were placed in this  encounter.   Signed,  Elpidio Galea. Burnell Hurta,  MD   Allergies as of 09/09/2023       Reactions   Biaxin [clarithromycin] Other (See Comments)   Pt has history of arrhythmias   Other Other (See Comments)   Penicillins Rash   Did it involve swelling of the face/tongue/throat, SOB, or low BP? Unknown Did it involve sudden or severe rash/hives, skin peeling, or any reaction on the inside of your mouth or nose? Unknown Did you need to seek medical attention at a hospital or doctor's office? Unknown When did it last happen?      childhood allergy If all above answers are "NO", may proceed with cephalosporin use.        Medication List        Accurate as of September 08, 2023  4:58 PM. If you have any questions, ask your nurse or doctor.          acetaminophen 500 MG tablet Commonly known as: TYLENOL Take 1,000 mg by mouth every 6 (six) hours as needed for moderate pain.   ascorbic acid 500 MG tablet Commonly known as: VITAMIN C Take 500 mg by mouth 2 (two) times daily.   B-D 3CC LUER-LOK SYR 22GX1" 22G X 1" 3 ML Misc Generic drug: SYRINGE-NEEDLE (DISP) 3 ML USE TO INJECT B 12 INJECTION   Citrucel oral powder Generic drug: methylcellulose Take 1 packet by mouth daily.   cyanocobalamin 1000 MCG/ML injection Commonly known as: VITAMIN B12 INJECT 1 ML (1,000 MCG TOTAL) INTO THE MUSCLE EVERY 30 DAYS.   diazepam 5 MG tablet Commonly known as: VALIUM TAKE 1 TABLET BY MOUTH EVERY 8 HOURS AS NEEDED FOR ANXIETY   diltiazem 180 MG 24 hr capsule Commonly known as: CARDIZEM CD TAKE 1 CAPSULE BY MOUTH EVERY DAY   flecainide 100 MG tablet Commonly known as: TAMBOCOR Take 1 tablet (100 mg total) by mouth 2 (two) times daily.   fluticasone 50 MCG/ACT nasal spray Commonly known as: FLONASE Place 2 sprays into both nostrils daily.   losartan 25 MG tablet Commonly known as: COZAAR TAKE 1 TABLET (25 MG TOTAL) BY MOUTH DAILY.   TURMERIC PO Take 1,000 mg by mouth 2 (two) times daily.   vedolizumab 300 MG  injection Commonly known as: ENTYVIO Inject 300 mg into the vein every 8 (eight) weeks.   Vitamin D (Cholecalciferol) 10 MCG (400 UNIT) Caps Take 400 Units by mouth 2 (two) times daily.

## 2023-09-09 ENCOUNTER — Ambulatory Visit (INDEPENDENT_AMBULATORY_CARE_PROVIDER_SITE_OTHER): Payer: 59 | Admitting: Family Medicine

## 2023-09-09 ENCOUNTER — Encounter: Payer: Self-pay | Admitting: Family Medicine

## 2023-09-09 VITALS — BP 130/80 | HR 74 | Temp 98.3°F | Ht 72.0 in | Wt 348.5 lb

## 2023-09-09 DIAGNOSIS — L989 Disorder of the skin and subcutaneous tissue, unspecified: Secondary | ICD-10-CM

## 2023-09-09 DIAGNOSIS — Z23 Encounter for immunization: Secondary | ICD-10-CM | POA: Diagnosis not present

## 2023-09-09 DIAGNOSIS — Z0000 Encounter for general adult medical examination without abnormal findings: Secondary | ICD-10-CM

## 2023-09-09 MED ORDER — DIAZEPAM 5 MG PO TABS: 5.0000 mg | ORAL_TABLET | Freq: Three times a day (TID) | ORAL | 1 refills | Status: AC | PRN

## 2023-09-09 NOTE — Addendum Note (Signed)
Addended by: Damita Lack on: 09/09/2023 11:04 AM   Modules accepted: Orders

## 2023-09-10 ENCOUNTER — Other Ambulatory Visit: Payer: Self-pay | Admitting: Gastroenterology

## 2023-09-20 NOTE — Progress Notes (Unsigned)
Cardiology Office Note  Date:  09/21/2023   ID:  Brandon [name], DOB [birth year], MRN [id number]  PCP:  Hannah Beat, MD   Chief Complaint  Patient presents with   12 month follow up     "Doing well." Medications reviewed by the patient verbally.     HPI:  Brandon [name] is a 57 -year-old gentleman with history of  hypertension,  obesity,  chronic back pain, after traumatic injury, fall atrial fibrillation , previously declined anticoagulation Atrial fibrillation likely dating back to before 2012, appreciated fluttering, does not remember circumstances Remote history of PVCs Cardiac catheterization may 2012 showed no significant CAD, normal ejection fraction greater than 55%. who presents for routine followup of his atrial fibrillation, hypertension, morbid obesity.  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, consistent with lipid-rich benign adenoma  Total cholesterol 125 LDL 80  EKG personally reviewed by myself on todays visit EKG Interpretation Date/Time:  Monday September 21 2023 16:03:07 EST Ventricular Rate:  77 PR Interval:  188 QRS Duration:  96 QT Interval:  398 QTC Calculation: 450 R Axis:   -6  Text Interpretation: Normal sinus rhythm Low voltage QRS When compared with ECG of 18-Jul-2022 10:11, No significant change was found Confirmed by Julien Nordmann [id number] on 09/21/2023 4:15:48 PM   Completed bilateral shoulder surgery Left shoulder surgery 02/2021  Chronic cervical and lower back pain  Back pain may stem from prior fall off a combine onto metal disc striking mid and lower  lumbar spine in October 2017 Chronic pain since that time  non-smoker, no diabetes, cholesterol at goal  Other past medical history reviewed Works on a farm with heavy machinery    several years since his last episode of atrial fibrillation.   Cardiac catheterization may 2012 showed no significant CAD, normal ejection fraction greater than 55%. Cholesterol well controlled without any cluster medication  PMH:   has a past medical history of Allergy, ATRIAL FIBRILLATION, PAROXYSMAL (06/07/2009), Basal cell carcinoma, CAD (coronary artery disease), Carpal tunnel syndrome of right wrist (03/2019), Cervical disc disorder with radiculopathy of cervical region (10/05/2013), Classical migraine (03/03/2012), Colon polyp, DEPRESSION (06/07/2009), DIVERTICULITIS, COLON (06/07/2009), Foraminal stenosis of cervical region, GERD (gastroesophageal reflux disease), History of kidney stones, Hypertension, Morbid obesity with BMI of 40.0-44.9, adult (HCC), PVC (premature ventricular contraction), Ulcerative colitis (HCC) (04/28/2018), and Ventricular ectopy.  PSH:    Past Surgical History:  Procedure Laterality Date   CARDIAC CATHETERIZATION     COLONOSCOPY  05/2014   COLONOSCOPY  06/22/2023   Ileene Patrick at [facility]   COLONOSCOPY WITH PROPOFOL N/A 04/20/2018   Procedure: COLONOSCOPY WITH PROPOFOL;  Surgeon: Christena Deem, MD;  Location[facility] ENDOSCOPY;  Service: Endoscopy;  Laterality: N/A;   COLONOSCOPY WITH PROPOFOL N/A 06/20/2019   Procedure: COLONOSCOPY WITH PROPOFOL;  Surgeon: Christena Deem, MD;  Location: [facility] ENDOSCOPY;  Service: Endoscopy;  Laterality: N/A;   COLONOSCOPY WITH PROPOFOL  06/2022   CYSTOSCOPY/URETEROSCOPY/HOLMIUM LASER/STENT PLACEMENT Right 07/22/2022   Procedure: CYSTOSCOPY/URETEROSCOPY/HOLMIUM LASER/STENT PLACEMENT;  Surgeon: Riki Altes, MD;  Location: [facility] ORS;  Service: Urology;  Laterality: Right;   FOOT SURGERY     left foot   kidney stone removal  SHOULDER ARTHROSCOPY WITH SUBACROMIAL DECOMPRESSION AND BICEP TENDON REPAIR Right 12/14/2018   Procedure: SHOULDER ARTHROSCOPY WITH EBRIDEMENT, DECOMPRESSION, DISTAL CLAVICLE EXCISION, BICEP TENODESIS AND DEBRIDEMENT, AND REPAIR OF ROTATOR CUFF TEAR;  Surgeon: Christena Flake, MD;  Location: [facility] ORS;  Service: Orthopedics;  Laterality: Right;   SHOULDER ARTHROSCOPY WITH SUBACROMIAL DECOMPRESSION, ROTATOR CUFF REPAIR AND BICEP TENDON REPAIR Left 02/12/2021   Procedure: LEFT SHOULDER ARTHROSCOPY WITH DEBRIDEMENT, DECOMPRESSION, ROTATOR CUFF REPAIR, BICEPS TENODESIS, AND DISTAL CLAVICLE EXCISION;  Surgeon: Christena Flake, MD;  Location: [facility] ORS;  Service: Orthopedics;  Laterality: Left;   TONSILLECTOMY      Current Outpatient Medications  Medication Sig Dispense Refill   cyanocobalamin (VITAMIN B12) 1000 MCG/ML injection INJECT 1 ML (1,000 MCG) INTRAMUSCULARLY EVERY 30 DAYS 1 mL 5   diazepam (VALIUM) 5 MG tablet Take 1 tablet (5 mg total) by mouth every 8 (eight) hours as needed. for anxiety 30 tablet 1   diltiazem (CARDIZEM CD) 180 MG 24 hr capsule TAKE 1 CAPSULE BY MOUTH EVERY DAY 90 capsule 0   flecainide (TAMBOCOR) 100 MG tablet Take 1 tablet (100 mg total) by mouth 2 (two) times daily. 180 tablet 0   losartan (COZAAR) 25 MG tablet TAKE 1 TABLET (25 MG TOTAL) BY MOUTH DAILY. 90 tablet 0   SYRINGE-NEEDLE, DISP, 3 ML (B-D 3CC LUER-LOK SYR 22GX1") 22G X 1" 3 ML MISC USE TO INJECT B 12 INJECTION     TURMERIC PO Take 1,000 mg by mouth 2 (two) times daily.     vedolizumab (ENTYVIO) 300 MG injection Inject 300 mg into the vein every 8 (eight) weeks. 1 each 0   No current facility-administered medications for this visit.    Allergies:   Biaxin [clarithromycin], Other, and Penicillins   Social History:  The patient  reports that he has never smoked. He has quit using smokeless tobacco.  His smokeless tobacco use included snuff. He reports that he does not currently use alcohol. He reports that he does  not use drugs.   Family History:   family history includes Arthritis in an other family member; Colon polyps in his mother; Diabetes in an other family member; Stroke in his father and another family member.   Review of Systems: Review of Systems  Constitutional: Negative.   HENT: Negative.    Respiratory: Negative.    Cardiovascular: Negative.   Gastrointestinal: Negative.  Negative for nausea.  Musculoskeletal:  Positive for back pain.       Neck pain, low back pain  Neurological: Negative.   Psychiatric/Behavioral: Negative.    All other systems reviewed and are negative.   PHYSICAL EXAM: VS:  BP 130/80 (BP Location: Left Arm, Patient Position: Sitting, Cuff Size: Large)   Pulse 77   Ht 6\' 2"  (1.88 m)   Wt (!) 357 lb 4 oz (162 kg)   SpO2 94%   BMI 45.87 kg/m  , BMI Body mass index is 45.87 kg/m. Constitutional:  oriented to person, place, and time. No distress.  HENT:  Head: Grossly normal Eyes:  no discharge. No scleral icterus.  Neck: No JVD, no carotid bruits  Cardiovascular: Regular rate and rhythm, no murmurs appreciated Pulmonary/Chest: Clear to auscultation bilaterally, no wheezes or rails Abdominal: Soft.  no distension.  no tenderness.  Musculoskeletal: Normal range of motion Neurological:  normal muscle tone. Coordination normal. No atrophy Skin: Skin warm and dry Psychiatric: normal affect, pleasant  Recent Labs: 09/02/2023: ALT 21; BUN 13; Creatinine, Ser 0.82; Hemoglobin 14.0; Platelets 266.0;  Potassium 4.1; Sodium 141; TSH 2.21    Lipid Panel Lab Results  Component Value Date   CHOL 125 09/02/2023   HDL 26.90 (L) 09/02/2023   LDLCALC 80 09/02/2023   TRIG 91.0 09/02/2023      Wt Readings from Last 3 Encounters:  09/21/23 (!) 357 lb 4 oz (162 kg)  09/09/23 (!) 348 lb 8 oz (158.1 kg)  09/04/23 (!) 352 lb 9.6 oz (159.9 kg)     ASSESSMENT AND PLAN:  Atrial fibrillation, Paroxysmal - Plan: EKG 12-Lead CHADSVASC ,score of 1 Maintaining normal  sinus rhythm on diltiazem and flecainide previously declined anticoagulation  Ulcerative colitis Symptoms improved on current medication regiment  Essential hypertension - Plan: EKG 12-Lead Blood pressure is well controlled on today's visit. No changes made to the medications.  Morbid obesity with BMI of 40.0-44.9, adult (HCC) We have encouraged continued exercise, careful diet management in an effort to lose weight.  Ventricular ectopy Asymptomatic from PVCs, continue current regiment     Orders Placed This Encounter  Procedures   EKG 12-Lead     Signed, Dossie Arbour, M.D., Ph.D. 09/21/2023  [facility] [place], [place] [phone]

## 2023-09-21 ENCOUNTER — Ambulatory Visit: Payer: 59 | Attending: Cardiovascular Disease | Admitting: Cardiovascular Disease

## 2023-09-21 ENCOUNTER — Encounter: Payer: Self-pay | Admitting: Cardiovascular Disease

## 2023-09-21 VITALS — BP 130/80 | HR 77 | Ht 74.0 in | Wt 357.2 lb

## 2023-09-21 DIAGNOSIS — I493 Ventricular premature depolarization: Secondary | ICD-10-CM | POA: Diagnosis not present

## 2023-09-21 DIAGNOSIS — I10 Essential (primary) hypertension: Secondary | ICD-10-CM

## 2023-09-21 DIAGNOSIS — I480 Paroxysmal atrial fibrillation: Secondary | ICD-10-CM | POA: Diagnosis not present

## 2023-09-21 DIAGNOSIS — I2510 Atherosclerotic heart disease of native coronary artery without angina pectoris: Secondary | ICD-10-CM | POA: Diagnosis not present

## 2023-09-21 MED ORDER — DILTIAZEM HCL ER COATED BEADS 180 MG PO CP24: ORAL_CAPSULE | ORAL | 3 refills | Status: DC

## 2023-09-21 MED ORDER — LOSARTAN POTASSIUM 25 MG PO TABS: 25.0000 mg | ORAL_TABLET | Freq: Every day | ORAL | 3 refills | Status: DC

## 2023-09-21 MED ORDER — FLECAINIDE ACETATE 100 MG PO TABS: 100.0000 mg | ORAL_TABLET | Freq: Two times a day (BID) | ORAL | 3 refills | Status: DC

## 2023-09-21 NOTE — Patient Instructions (Signed)

## 2023-10-30 ENCOUNTER — Ambulatory Visit (INDEPENDENT_AMBULATORY_CARE_PROVIDER_SITE_OTHER): Payer: 59

## 2023-10-30 VITALS — BP 131/72 | HR 81 | Temp 98.3°F | Resp 20 | Ht 74.0 in | Wt 357.8 lb

## 2023-10-30 DIAGNOSIS — K51318 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: Secondary | ICD-10-CM | POA: Diagnosis not present

## 2023-10-30 MED ORDER — SODIUM CHLORIDE 0.9 % IV SOLN
300.0000 mg | Freq: Once | INTRAVENOUS | Status: AC
  Administered 2023-10-30: 300 mg via INTRAVENOUS
  Filled 2023-10-30: qty 5

## 2023-10-30 NOTE — Progress Notes (Signed)
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 AVS Declined  Performed by:  Rico Ala, LPN

## 2023-11-25 ENCOUNTER — Encounter: Payer: Self-pay | Admitting: Gastroenterology

## 2023-12-06 ENCOUNTER — Other Ambulatory Visit: Payer: Self-pay | Admitting: Cardiovascular Disease

## 2023-12-09 ENCOUNTER — Ambulatory Visit (INDEPENDENT_AMBULATORY_CARE_PROVIDER_SITE_OTHER): Payer: 59

## 2023-12-09 DIAGNOSIS — Z23 Encounter for immunization: Secondary | ICD-10-CM

## 2023-12-09 NOTE — Progress Notes (Signed)
 Per orders of Dr. Jolena Nay Copland, injection of shingrix  given by Claretha Crocker in left deltoid. Patient tolerated injection well.

## 2023-12-25 ENCOUNTER — Ambulatory Visit (INDEPENDENT_AMBULATORY_CARE_PROVIDER_SITE_OTHER): Payer: 59

## 2023-12-25 VITALS — BP 130/73 | HR 61 | Temp 98.0°F | Resp 16 | Ht 74.0 in | Wt 353.0 lb

## 2023-12-25 DIAGNOSIS — K51318 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: Secondary | ICD-10-CM | POA: Diagnosis not present

## 2023-12-25 MED ORDER — VEDOLIZUMAB 300 MG IV SOLR
300.0000 mg | Freq: Once | INTRAVENOUS | Status: AC
  Administered 2023-12-25: 300 mg via INTRAVENOUS
  Filled 2023-12-25: qty 5

## 2023-12-25 NOTE — Progress Notes (Signed)
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 AVS Declined  Performed by:  Wyvonne Lenz, RN

## 2024-01-08 ENCOUNTER — Telehealth: Payer: Self-pay | Admitting: Gastroenterology

## 2024-01-08 MED ORDER — SYRINGE/NEEDLE (DISP) 25G X 1" 3 ML MISC: 0 refills | Status: DC

## 2024-01-08 NOTE — Telephone Encounter (Signed)
 Brandon [name] with [facility] in [place] is calling to get a prescription sent for 25 gauge/68mL needle for Entyvio. Please advise.

## 2024-01-08 NOTE — Telephone Encounter (Signed)
 Patient receives Entyvio infusion at the [facility] on [facility]. Syringes are likely for B12 injections. RX has been sent.

## 2024-01-29 ENCOUNTER — Other Ambulatory Visit: Payer: Self-pay | Admitting: Gastroenterology

## 2024-02-22 ENCOUNTER — Ambulatory Visit (INDEPENDENT_AMBULATORY_CARE_PROVIDER_SITE_OTHER): Payer: 59

## 2024-02-22 VITALS — BP 129/85 | HR 70 | Temp 97.8°F | Resp 16 | Ht 74.0 in | Wt 359.4 lb

## 2024-02-22 DIAGNOSIS — K51318 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: Secondary | ICD-10-CM | POA: Diagnosis not present

## 2024-02-22 MED ORDER — VEDOLIZUMAB 300 MG IV SOLR
300.0000 mg | Freq: Once | INTRAVENOUS | Status: AC
  Administered 2024-02-22: 300 mg via INTRAVENOUS
  Filled 2024-02-22: qty 5

## 2024-02-22 NOTE — Progress Notes (Signed)
 Diagnosis: Ulcerative Rectosigmoiditis  Provider:  Phyllis Breeze MD  Procedure: IV Infusion  IV Type: Peripheral, IV Location: R Antecubital  Entyvio  (Vedolizumab ), Dose: 300 mg  Infusion Start Time: 1010  Infusion Stop Time: 1044  Post Infusion IV Care: Peripheral IV Discontinued  Discharge: Condition: Good, Destination: Home . AVS Declined  Performed by:  Shirly Dow, RN

## 2024-02-28 ENCOUNTER — Other Ambulatory Visit: Payer: Self-pay | Admitting: Gastroenterology

## 2024-03-02 ENCOUNTER — Other Ambulatory Visit: Payer: Self-pay | Admitting: Gastroenterology

## 2024-03-31 ENCOUNTER — Ambulatory Visit: Admitting: Dermatology

## 2024-03-31 ENCOUNTER — Encounter: Payer: Self-pay | Admitting: Dermatology

## 2024-03-31 DIAGNOSIS — D1801 Hemangioma of skin and subcutaneous tissue: Secondary | ICD-10-CM | POA: Diagnosis not present

## 2024-03-31 DIAGNOSIS — D492 Neoplasm of unspecified behavior of bone, soft tissue, and skin: Secondary | ICD-10-CM

## 2024-03-31 DIAGNOSIS — L578 Other skin changes due to chronic exposure to nonionizing radiation: Secondary | ICD-10-CM

## 2024-03-31 DIAGNOSIS — Z1283 Encounter for screening for malignant neoplasm of skin: Secondary | ICD-10-CM | POA: Diagnosis not present

## 2024-03-31 DIAGNOSIS — D229 Melanocytic nevi, unspecified: Secondary | ICD-10-CM

## 2024-03-31 DIAGNOSIS — L570 Actinic keratosis: Secondary | ICD-10-CM | POA: Diagnosis not present

## 2024-03-31 DIAGNOSIS — Z808 Family history of malignant neoplasm of other organs or systems: Secondary | ICD-10-CM | POA: Diagnosis not present

## 2024-03-31 DIAGNOSIS — W908XXA Exposure to other nonionizing radiation, initial encounter: Secondary | ICD-10-CM

## 2024-03-31 DIAGNOSIS — L859 Epidermal thickening, unspecified: Secondary | ICD-10-CM

## 2024-03-31 DIAGNOSIS — L821 Other seborrheic keratosis: Secondary | ICD-10-CM

## 2024-03-31 DIAGNOSIS — D489 Neoplasm of uncertain behavior, unspecified: Secondary | ICD-10-CM

## 2024-03-31 DIAGNOSIS — L918 Other hypertrophic disorders of the skin: Secondary | ICD-10-CM | POA: Diagnosis not present

## 2024-03-31 DIAGNOSIS — L814 Other melanin hyperpigmentation: Secondary | ICD-10-CM

## 2024-03-31 NOTE — Patient Instructions (Addendum)
 Patient Handout: Wound Care for Skin Biopsy Site  Taking Care of Your Skin Biopsy Site  Proper care of the biopsy site is essential for promoting healing and minimizing scarring. This handout provides instructions on how to care for your biopsy site to ensure optimal recovery.  1. Cleaning the Wound:  Clean the biopsy site daily with gentle soap and water. Gently pat the area dry with a clean, soft towel. Avoid harsh scrubbing or rubbing the area, as this can irritate the skin and delay healing.  2. Applying Aquaphor and Bandage:  After cleaning the wound, apply a thin layer of Aquaphor ointment to the biopsy site. Cover the area with a sterile bandage to protect it from dirt, bacteria, and friction. Change the bandage daily or as needed if it becomes soiled or wet.  3. Continued Care for One Week:  Repeat the cleaning, Aquaphor application, and bandaging process daily for one week following the biopsy procedure. Keeping the wound clean and moist during this initial healing period will help prevent infection and promote optimal healing.  4. Massaging Aquaphor into the Area:  ---After one week, discontinue the use of bandages but continue to apply Aquaphor to the biopsy site. ----Gently massage the Aquaphor into the area using circular motions. ---Massaging the skin helps to promote circulation and prevent the formation of scar tissue.   Additional Tips:  Avoid exposing the biopsy site to direct sunlight during the healing process, as this can cause hyperpigmentation or worsen scarring. If you experience any signs of infection, such as increased redness, swelling, warmth, or drainage from the wound, contact your healthcare provider immediately. Follow any additional instructions provided by your healthcare provider for caring for the biopsy site and managing any discomfort. Conclusion:  Taking proper care of your skin biopsy site is crucial for ensuring optimal healing and  minimizing scarring. By following these instructions for cleaning, applying Aquaphor, and massaging the area, you can promote a smooth and successful recovery. If you have any questions or concerns about caring for your biopsy site, don't hesitate to contact your healthcare provider for guidance.    For areas treated with Liquid Nitrogen:  Keep clean with soap and water.  Apply Vaseline or Aquaphor twice daily.  Some Recommended Sunscreens for Sensitive Skin Include:  Body or All Over Sunscreen (water resistant) EltaMD UV Pure Blue lizard sensitive Sun bum mineral (avoid if sensitive to scent) Aveeno Positively Mineral Neutrogena sheer zinc (Slightly harder to rub in) [facility] clear zinc (Slightly harder to rub in) Vanicream mineral sunscreen spf 50+  Face Sunscreen EltaMD UV Elements (tinted) EltaMD UV Restore (tinted or nontinted) EltaMD UV Physical (tinted) CeraVe hydrating sunscreen 50 face (tinted or nontinted) Colorescience Sunforgettable Total Protection Face Shield (good for most skin tones) La Roche Posay Mineral Tinted Cotz Flawless Complexion   Powder Sunscreen (Nice for reapplying or applying on the go) Colorescience Sunforgettable Total Protection Brush on Shield (available in different tints)  Important Information  Due to recent changes in healthcare laws, you may see results of your pathology and/or laboratory studies on MyChart before the doctors have had a chance to review them. We understand that in some cases there may be results that are confusing or concerning to you. Please understand that not all results are received at the same time and often the doctors may need to interpret multiple results in order to provide you with the best plan of care or course of treatment. Therefore, we ask that you please give us  2  business days to thoroughly review all your results before contacting the office for clarification. Should we see a critical lab result, you will be contacted  sooner.   If You Need Anything After Your Visit  If you have any questions or concerns for your doctor, please call our main line at [phone] If no one answers, please leave a voicemail as directed and we will return your call as soon as possible. Messages left after 4 pm will be answered the following business day.   You may also send us  a message via MyChart. We typically respond to MyChart messages within 1-2 business days.  For prescription refills, please ask your pharmacy to contact our office. Our fax number is [phone].  If you have an urgent issue when the clinic is closed that cannot wait until the next business day, you can page your doctor at the number below.    Please note that while we do our best to be available for urgent issues outside of office hours, we are not available 24/7.   If you have an urgent issue and are unable to reach us , you may choose to seek medical care at your doctor's office, retail clinic, urgent care center, or emergency room.  If you have a medical emergency, please immediately call 911 or go to the emergency department. In the event of inclement weather, please call our main line at [phone] for an update on the status of any delays or closures.  Dermatology Medication Tips: Please keep the boxes that topical medications come in in order to help keep track of the instructions about where and how to use these. Pharmacies typically print the medication instructions only on the boxes and not directly on the medication tubes.   If your medication is too expensive, please contact our office at [phone] or send us  a message through MyChart.   We are unable to tell what your co-pay for medications will be in advance as this is different depending on your insurance coverage. However, we may be able to find a substitute medication at lower cost or fill out paperwork to get insurance to cover a needed medication.   If a prior authorization is  required to get your medication covered by your insurance company, please allow us  1-2 business days to complete this process.  Drug prices often vary depending on where the prescription is filled and some pharmacies may offer cheaper prices.  The website www.goodrx.com contains coupons for medications through different pharmacies. The prices here do not account for what the cost may be with help from insurance (it may be cheaper with your insurance), but the website can give you the price if you did not use any insurance.  - You can print the associated coupon and take it with your prescription to the pharmacy.  - You may also stop by our office during regular business hours and pick up a GoodRx coupon card.  - If you need your prescription sent electronically to a different pharmacy, notify our office through [facility]) or by phone at [phone]

## 2024-03-31 NOTE — Progress Notes (Unsigned)
 New Patient Visit   Subjective  Brandon [name] is a 58 y.o. male who presents for the following: Skin Cancer Screening and Full Body Skin Exam.  Patient reports that he spends all day in the sun and declines using sunscreen. Patient states that he typically wears a ball cap and occasionally wears a straw hat.  Denies any personal history of skin cancer. Mother and Brother have a history of skin cancer of undetermined kind.  The patient presents for Total-Body Skin Exam (TBSE) for skin cancer screening and mole check. The patient has spots, moles and lesions to be evaluated, some may be new or changing and the patient may have concern these could be cancer.    The following portions of the chart were reviewed this encounter and updated as appropriate: medications, allergies, medical history  Review of Systems:  No other skin or systemic complaints except as noted in HPI or Assessment and Plan.  Objective  Well appearing patient in no apparent distress; mood and affect are within normal limits.  A full examination was performed including scalp, head, eyes, ears, nose, lips, neck, chest, axillae, abdomen, back, buttocks, bilateral upper extremities, bilateral lower extremities, hands, feet, fingers, toes, fingernails, and toenails. All findings within normal limits unless otherwise noted below.   Relevant physical exam findings are noted in the Assessment and Plan.  Left Upper Arm - Posterior 7mm pink scaly papule  Left Occipital Scalp 1.6 cm scaly plaque   Assessment & Plan   SKIN CANCER SCREENING PERFORMED TODAY.  ACTINIC DAMAGE - Chronic condition, secondary to cumulative UV/sun exposure - diffuse scaly erythematous macules with underlying dyspigmentation - Recommend daily broad spectrum sunscreen SPF 30+ to sun-exposed areas, reapply every 2 hours as needed.  - Staying in the shade or wearing long sleeves, sun glasses (UVA+UVB protection) and wide brim hats (4-inch brim  around the entire circumference of the hat) are also recommended for sun protection.  - Call for new or changing lesions.  LENTIGINES, SEBORRHEIC KERATOSES, HEMANGIOMAS - Benign normal skin lesions - Benign-appearing - Call for any changes  MELANOCYTIC NEVI - Tan-brown and/or pink-flesh-colored symmetric macules and papules - Benign appearing on exam today - Observation - Call clinic for new or changing moles - Recommend daily use of broad spectrum spf 30+ sunscreen to sun-exposed areas.    Acrochordons (Skin Tags) - Fleshy, skin-colored pedunculated papules - Benign appearing.  - Observe. - If desired, they can be removed with an in office procedure that is not covered by insurance. - Please call the clinic if you notice any new or changing lesions.     ACTINIC KERATOSIS Right Forearm - Posterior Destruction of lesion - Right Forearm - Posterior Complexity: extensive   Destruction method: cryotherapy   Timeout:  patient name, date of birth, surgical site, and procedure verified Lesion destroyed using liquid nitrogen: Yes   Region frozen until ice ball extended beyond lesion: Yes   Cryotherapy cycles:  1 Outcome: patient tolerated procedure well with no complications   Post-procedure details: wound care instructions given   NEOPLASM OF UNCERTAIN BEHAVIOR (2) Left Upper Arm - Posterior Skin / nail biopsy Type of biopsy: tangential   Timeout: patient name, date of birth, surgical site, and procedure verified   Instrument used: DermaBlade   Hemostasis achieved with: aluminum chloride    Skin / nail biopsy Type of biopsy: tangential   Timeout: patient name, date of birth, surgical site, and procedure verified   Anesthesia: the lesion was anesthetized in  a standard fashion   Anesthetic:  1% lidocaine  w/ epinephrine  1-100,000 buffered w/ 8.4% NaHCO3 Instrument used: DermaBlade   Hemostasis achieved with: aluminum chloride   Outcome: patient tolerated procedure well    Post-procedure details: wound care instructions given   Specimen 1 - Surgical pathology Differential Diagnosis: r/o NMSC vs other  Check Margins: No Left Occipital Scalp Skin / nail biopsy Type of biopsy: tangential   Timeout: patient name, date of birth, surgical site, and procedure verified   Anesthesia: the lesion was anesthetized in a standard fashion   Anesthetic:  1% lidocaine  w/ epinephrine  1-100,000 buffered w/ 8.4% NaHCO3 Instrument used: DermaBlade   Hemostasis achieved with: aluminum chloride   Outcome: patient tolerated procedure well   Post-procedure details: wound care instructions given   Specimen 2 - Surgical pathology Differential Diagnosis: r/o NMSC vs other  Check Margins: No  Return in about 1 year (around 03/31/2025).  Maurine Sovereign, RN, am acting as scribe for Deneise Finlay, MD .   Documentation: I have reviewed the above documentation for accuracy and completeness, and I agree with the above.  Deneise Finlay, MD

## 2024-04-01 ENCOUNTER — Encounter: Payer: Self-pay | Admitting: Dermatology

## 2024-04-01 LAB — SURGICAL PATHOLOGY

## 2024-04-03 ENCOUNTER — Ambulatory Visit: Payer: Self-pay | Admitting: Dermatology

## 2024-04-14 DIAGNOSIS — Z6841 Body Mass Index (BMI) 40.0 and over, adult: Secondary | ICD-10-CM | POA: Diagnosis not present

## 2024-04-14 DIAGNOSIS — F419 Anxiety disorder, unspecified: Secondary | ICD-10-CM | POA: Diagnosis not present

## 2024-04-14 DIAGNOSIS — Z79899 Other long term (current) drug therapy: Secondary | ICD-10-CM | POA: Diagnosis not present

## 2024-04-14 DIAGNOSIS — I10 Essential (primary) hypertension: Secondary | ICD-10-CM | POA: Diagnosis not present

## 2024-04-14 DIAGNOSIS — D6869 Other thrombophilia: Secondary | ICD-10-CM | POA: Diagnosis not present

## 2024-04-14 DIAGNOSIS — D519 Vitamin B12 deficiency anemia, unspecified: Secondary | ICD-10-CM | POA: Diagnosis not present

## 2024-04-14 DIAGNOSIS — Z79620 Long term (current) use of immunosuppressive biologic: Secondary | ICD-10-CM | POA: Diagnosis not present

## 2024-04-14 DIAGNOSIS — M1990 Unspecified osteoarthritis, unspecified site: Secondary | ICD-10-CM | POA: Diagnosis not present

## 2024-04-14 DIAGNOSIS — J301 Allergic rhinitis due to pollen: Secondary | ICD-10-CM | POA: Diagnosis not present

## 2024-04-14 DIAGNOSIS — N400 Enlarged prostate without lower urinary tract symptoms: Secondary | ICD-10-CM | POA: Diagnosis not present

## 2024-04-14 DIAGNOSIS — I2510 Atherosclerotic heart disease of native coronary artery without angina pectoris: Secondary | ICD-10-CM | POA: Diagnosis not present

## 2024-04-22 ENCOUNTER — Ambulatory Visit (INDEPENDENT_AMBULATORY_CARE_PROVIDER_SITE_OTHER)

## 2024-04-22 VITALS — BP 145/76 | HR 59 | Temp 98.0°F | Resp 16 | Ht 74.0 in | Wt 352.8 lb

## 2024-04-22 DIAGNOSIS — K51318 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: Secondary | ICD-10-CM | POA: Diagnosis not present

## 2024-04-22 MED ORDER — VEDOLIZUMAB 300 MG IV SOLR
300.0000 mg | Freq: Once | INTRAVENOUS | Status: AC
  Administered 2024-04-22: 300 mg via INTRAVENOUS
  Filled 2024-04-22: qty 5

## 2024-04-22 NOTE — Progress Notes (Signed)
 Diagnosis: Ulcerative Colitis  Provider:  Mannam, Praveen MD  Procedure: IV Infusion  IV Type: Peripheral, IV Location: R Antecubital  Entyvio  (Vedolizumab ), Dose: 300 mg  Infusion Start Time: 1018  Infusion Stop Time: 1052  Post Infusion IV Care: Peripheral IV Discontinued  Discharge: Condition: Good, Destination: Home . AVS Declined  Performed by:  Lendel Quant, RN

## 2024-05-26 ENCOUNTER — Telehealth: Payer: Self-pay | Admitting: Pharmacy Technician

## 2024-05-26 NOTE — Telephone Encounter (Signed)
 Auth Submission: APPROVED Site of care: Site of care: [facility] Payer: [employer] Medication & CPT/J Code(s) submitted: Entyvio  (Vedolizumab ) J3380 Diagnosis Code:  Route of submission (phone, fax, portal):   Phone # Fax # Auth type: Buy/Bill PB Units/visits requested: 300MG  Q8WKS X 7 DOSES Reference number: [id number] Approval from: 05/23/24 to 05/22/25

## 2024-06-03 ENCOUNTER — Telehealth: Payer: Self-pay | Admitting: Gastroenterology

## 2024-06-03 ENCOUNTER — Other Ambulatory Visit: Payer: Self-pay | Admitting: Gastroenterology

## 2024-06-03 MED ORDER — CYANOCOBALAMIN 1000 MCG/ML IJ SOLN: 1000.0000 ug | INTRAMUSCULAR | 2 refills | Status: DC

## 2024-06-03 NOTE — Telephone Encounter (Signed)
 Informed patient he is due for a follow up visit with Dr. Leigh and that is why his prescription was denied. Offered to schedule an appt with Dr. Leigh and send his B12 to the pharmacy.  Patient scheduled an appt for 08/25/24 at 10:10 am. Prescription sent to patient's pharmacy.

## 2024-06-03 NOTE — Telephone Encounter (Signed)
 Patient is calling because he received a call from [facility] in [place] informing him his B12 injection were denied by the provider. Patient is requesting to know why. Patient is requesting a call back .Please advise.

## 2024-06-17 ENCOUNTER — Ambulatory Visit (INDEPENDENT_AMBULATORY_CARE_PROVIDER_SITE_OTHER): Admitting: *Deleted

## 2024-06-17 VITALS — BP 124/78 | HR 63 | Temp 97.5°F | Resp 16 | Ht 74.0 in | Wt 353.8 lb

## 2024-06-17 DIAGNOSIS — K51318 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: Secondary | ICD-10-CM | POA: Diagnosis not present

## 2024-06-17 MED ORDER — VEDOLIZUMAB 300 MG IV SOLR
300.0000 mg | Freq: Once | INTRAVENOUS | Status: AC
  Administered 2024-06-17: 300 mg via INTRAVENOUS
  Filled 2024-06-17: qty 5

## 2024-06-17 NOTE — Progress Notes (Signed)
 Diagnosis: Ulcerative Colitis  Provider:  Mannam, Praveen MD  Procedure: IV Infusion  IV Type: Peripheral, IV Location: R Antecubital  Entyvio (Vedolizumab), Dose: 300 mg  Infusion Start Time: 1027 am  Infusion Stop Time: 1105  Post Infusion IV Care: Peripheral IV Discontinued  Discharge: Condition: Good, Destination: Home . AVS Declined  Performed by:  Trudy Lamarr LABOR, RN

## 2024-07-03 ENCOUNTER — Other Ambulatory Visit: Payer: Self-pay | Admitting: Cardiovascular Disease

## 2024-08-12 ENCOUNTER — Ambulatory Visit (INDEPENDENT_AMBULATORY_CARE_PROVIDER_SITE_OTHER): Admitting: *Deleted

## 2024-08-12 VITALS — BP 129/76 | HR 62 | Temp 97.7°F | Resp 18 | Ht 74.0 in | Wt 354.8 lb

## 2024-08-12 DIAGNOSIS — K51318 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: Secondary | ICD-10-CM

## 2024-08-12 MED ORDER — VEDOLIZUMAB 300 MG IV SOLR
300.0000 mg | Freq: Once | INTRAVENOUS | Status: AC
  Administered 2024-08-12: 300 mg via INTRAVENOUS
  Filled 2024-08-12: qty 5

## 2024-08-12 NOTE — Progress Notes (Signed)
 Diagnosis: Ulcerative Colitis  Provider:  Mannam, Praveen MD  Procedure: IV Infusion  IV Type: Peripheral, IV Location: R Antecubital  Entyvio  (Vedolizumab ), Dose: 300 mg  Infusion Start Time: 1004 am  Infusion Stop Time: 1036  Post Infusion IV Care: Observation period completed and Peripheral IV Discontinued  Discharge: Condition: Good, Destination: Home . AVS Declined  Performed by:  Trudy Lamarr LABOR, RN

## 2024-08-18 ENCOUNTER — Telehealth: Payer: Self-pay | Admitting: *Deleted

## 2024-08-18 ENCOUNTER — Other Ambulatory Visit: Payer: Self-pay | Admitting: Family Medicine

## 2024-08-18 DIAGNOSIS — Z125 Encounter for screening for malignant neoplasm of prostate: Secondary | ICD-10-CM

## 2024-08-18 DIAGNOSIS — Z79899 Other long term (current) drug therapy: Secondary | ICD-10-CM

## 2024-08-18 DIAGNOSIS — E038 Other specified hypothyroidism: Secondary | ICD-10-CM

## 2024-08-18 DIAGNOSIS — I10 Essential (primary) hypertension: Secondary | ICD-10-CM

## 2024-08-18 NOTE — Telephone Encounter (Signed)
 Copied from CRM #[id number]. Topic: Clinical - Request for Lab/Test Order >> Aug 18, 2024  2:15 PM Viola F wrote: Reason for CRM: Patient needs lab orders for physical entered, he has a lab appt prior to physical 09/21/24.

## 2024-08-18 NOTE — Telephone Encounter (Signed)
Future lab orders placed in Epic.

## 2024-08-25 ENCOUNTER — Encounter: Payer: Self-pay | Admitting: Gastroenterology

## 2024-08-25 ENCOUNTER — Ambulatory Visit: Admitting: Gastroenterology

## 2024-08-25 VITALS — BP 126/74 | HR 72 | Ht 72.0 in | Wt 352.2 lb

## 2024-08-25 DIAGNOSIS — K5100 Ulcerative (chronic) pancolitis without complications: Secondary | ICD-10-CM | POA: Diagnosis not present

## 2024-08-25 DIAGNOSIS — R9389 Abnormal findings on diagnostic imaging of other specified body structures: Secondary | ICD-10-CM

## 2024-08-25 NOTE — Progress Notes (Signed)
 HPI :  58 year old male here for a follow-up visit for ulcerative colitis.   UC HISTORY: Dx 2019, I met him in June 2023.  He had been on a variety of mesalamine  regimens including sulfasalazine, Asacol , balsalazide, Lialda , and Rowasa .  Numerous flares on these regimens.  History of C. difficile as well.  Elevated fecal calprotectin upwards of 6-700s in recent years on mesalamine .  Had active inflammation on his last colonoscopy and started on Entyvio  06/2022.   SINCE LAST VISIT:   58 year old male here for a follow-up visit for ulcerative pancolitis.  I last saw him in August 2024 for his colonoscopy.  On that exam he was in remission while on Entyvio .  There was no active inflammation appreciated with only minimal colitis changes in the rectum.  He has been on Entyvio  over 2 years now, every 8 weeks infusions.  He has not had any flares while on this regimen.  He states he does not have any diarrhea or rectal bleeding that bothers him.  Occasionally he may go few days without having a bowel movement.  Denies any abdominal pains.  Generally tolerates the Entyvio  pretty well.  He inquires about the subcutaneous option he has seen on TV and we discussed if that is something he wanted to pursue.  He does not think he can give himself injections, especially every 2 weeks and is not interested in pursuing that and would prefer IV infusions for now.  Recall at the last visit we had reviewed his CT renal study from July 2023 which was ordered by his urologist.  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  I have recommended a repeat CT scan as he was not really interested in surgery for this, he states he saw his urologist about it who also recommended a CT scan but unfortunately it was never done.  He has not had that exam since have seen him and inquires if we can help him with this.  Denies any  weight loss/abdominal pain etc. otherwise.   Prior workup: Colonoscopy 06/06/22:The perianal and digital rectal examinations were normal. - Diffuse mild to moderate inflammation characterized by erythema, friability, granularity and loss of vascularity was found in the rectum, in the recto-sigmoid colon, in the sigmoid colon, in the descending colon, at the splenic flexure, in the transverse colon, at the hepatic flexure and in the mid ascending colon. Mostly normal rectum, very mild changes, and lower sigmoid, but from mid sigmoid to mid ascending there was mild to moderate inflammation. Biopsies were taken with a cold forceps for histology. - Multiple small-mouthed diverticula were found in the sigmoid colon and transverse colon. - Internal hemorrhoids were found during retroflexion. - The exam was otherwise without abnormality. Due to looping, ileal intubation not possible   1. Surgical [P], right colon - MILDLY ACTIVE CHRONIC COLITIS CONSISTENT WITH INFLAMMATORY BOWEL DISEASE. - NEGATIVE FOR DYSPLASIA. 2. Surgical [P], colon, transverse - MILDLY ACTIVE CHRONIC COLITIS CONSISTENT WITH INFLAMMATORY BOWEL DISEASE. - NEGATIVE FOR DYSPLASIA. 3. Surgical [P], left colon - MODERATELY ACTIVE CHRONIC COLITIS CONSISTENT WITH INFLAMMATORY BOWEL DISEASE. - NEGATIVE FOR DYSPLASIA.     Fecal calprotectin Jan 2024 - value of 53 (down from 700s previously)   CT renal stone study 05/23/22: IMPRESSION: 1. Obstructive 5 mm right ureterovesicular junction stone. 2. Interval increase in size of a 4.8 cm left adrenal mass, consistent  with lipid-rich benign adenoma. No follow-up imaging is recommended. JACR 2017 Aug; 14(8):1038-44, JCAT 2016 Mar-Apr; 40(2):194-200, Urol J 2006 Spring; 3(2):71-4. 3. 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 Surgical consultation is recommended as myelolipomas > 4 cm are at an increased risk for hemorrhage and  rupture. Consider biochemical lab evaluation for functional status and pheochromocytoma prior to resection. JACR 2017 Aug; 14(8):1038-44, JCAT 2016 Mar-Apr; 40(2):194-200, Urol J 2006 Spring; 3(2):71-4. 4. Colonic diverticulosis with no acute diverticulitis.    Colonoscopy 06/22/2023: - Skin tags were found on perianal exam. - A diminutive polyp was found in the ascending colon. The polyp was sessile. The polyp was removed with a cold biopsy forceps. Resection and retrieval were complete. - Multiple small-mouthed diverticula were found in the sigmoid colon. - Internal hemorrhoids were found during retroflexion. - The exam was otherwise without abnormality. - Biopsies were taken with a cold forceps for ulcerative colitis surveillance. These biopsy specimens were sent to Pathology.  1. Surgical [P], right colon COLONIC MUCOSA WITH NO SIGNIFICANT DIAGNOSTIC ALTERATION. NEGATIVE FOR ACTIVITY, CHRONICITY, GRANULOMA, DYSPLASIA OR MALIGNANCY. 2. Surgical [P], colon, transverse COLONIC MUCOSA WITH NO SIGNIFICANT DIAGNOSTIC ALTERATION. NEGATIVE FOR ACTIVITY, CHRONICITY, GRANULOMA, DYSPLASIA OR MALIGNANCY. 3. Surgical [P], left colon COLONIC MUCOSA WITH MILD CRYPT ARCHITECTURAL DISARRAY AND PANETH CELL METAPLASIA. NEGATIVE FOR ACTIVITY, CHRONICITY, GRANULOMA, DYSPLASIA OR MALIGNANCY. 4. Surgical [P], colon, ascending, polyp (1) MUCOSAL SCHWANN CELL HAMARTOMA. NEGATIVE FOR DYSPLASIA OR MALIGNANCY. 5. Surgical [P], colon, rectum CHRONIC INACTIVE COLITIS. NEGATIVE FOR GRANULOMA,DYSPLASIA OR MALIGNANCY.      Past Medical History:  Diagnosis Date   Allergy    ATRIAL FIBRILLATION, PAROXYSMAL 06/07/2009   Basal cell carcinoma    CAD (coronary artery disease)    Carpal tunnel syndrome of right wrist 03/2019   Cervical disc disorder with radiculopathy of cervical region 10/05/2013   Classical migraine 03/03/2012   Colon polyp    DEPRESSION 06/07/2009   DIVERTICULITIS, COLON 06/07/2009    Foraminal stenosis of cervical region    GERD (gastroesophageal reflux disease)    History of kidney stones    Hypertension    Morbid obesity with BMI of 40.0-44.9, adult (HCC)    PVC (premature ventricular contraction)    Ulcerative colitis (HCC) 04/28/2018   Ventricular ectopy      Past Surgical History:  Procedure Laterality Date   CARDIAC CATHETERIZATION     COLONOSCOPY  05/2014   COLONOSCOPY  06/22/2023   Elspeth Naval at [facility]   COLONOSCOPY WITH PROPOFOL  N/A 04/20/2018   Procedure: COLONOSCOPY WITH PROPOFOL ;  Surgeon: Gaylyn Gladis PENNER, MD;  Location: [facility] ENDOSCOPY;  Service: Endoscopy;  Laterality: N/A;   COLONOSCOPY WITH PROPOFOL  N/A 06/20/2019   Procedure: COLONOSCOPY WITH PROPOFOL ;  Surgeon: Gaylyn Gladis PENNER, MD;  Location: [facility] ENDOSCOPY;  Service: Endoscopy;  Laterality: N/A;   COLONOSCOPY WITH PROPOFOL   06/2022   CYSTOSCOPY/URETEROSCOPY/HOLMIUM LASER/STENT PLACEMENT Right 07/22/2022   Procedure: CYSTOSCOPY/URETEROSCOPY/HOLMIUM LASER/STENT PLACEMENT;  Surgeon: Twylla Glendia BROCKS, MD;  Location: [facility] ORS;  Service: Urology;  Laterality: Right;   FOOT SURGERY     left foot   kidney stone removal      SHOULDER ARTHROSCOPY WITH SUBACROMIAL DECOMPRESSION AND BICEP TENDON REPAIR Right 12/14/2018   Procedure: SHOULDER ARTHROSCOPY WITH EBRIDEMENT, DECOMPRESSION, DISTAL CLAVICLE EXCISION, BICEP TENODESIS AND DEBRIDEMENT, AND REPAIR OF ROTATOR CUFF TEAR;  Surgeon: Edie Norleen PARAS, MD;  Location: [facility] ORS;  Service: Orthopedics;  Laterality: Right;   SHOULDER ARTHROSCOPY WITH SUBACROMIAL DECOMPRESSION, ROTATOR CUFF  REPAIR AND BICEP TENDON REPAIR Left 02/12/2021   Procedure: LEFT SHOULDER ARTHROSCOPY WITH DEBRIDEMENT, DECOMPRESSION, ROTATOR CUFF REPAIR, BICEPS TENODESIS, AND DISTAL CLAVICLE EXCISION;  Surgeon: Edie Norleen PARAS, MD;  Location: [facility] ORS;  Service: Orthopedics;  Laterality: Left;   TONSILLECTOMY     Family History  Problem Relation Age of Onset   Colon polyps Mother     Skin cancer Mother    Stroke Father    Skin cancer Brother    Arthritis Other    Diabetes Other        1st degree relative   Stroke Other        F 1st degree relative <60; M 1st degree relative <50   Colon cancer Neg Hx    Stomach cancer Neg Hx    Esophageal cancer Neg Hx    Rectal cancer Neg Hx    Social History   Tobacco Use   Smoking status: Never   Smokeless tobacco: Former    Types: Snuff   Tobacco comments:    occasionally  Vaping Use   Vaping status: Never Used  Substance Use Topics   Alcohol use: Not Currently    Alcohol/week: 0.0 standard drinks of alcohol   Drug use: Never   Current Outpatient Medications  Medication Sig Dispense Refill   BD INTEGRA SYRINGE 25G X 1 3 ML MISC USE TO INJECT B12 3 each 0   cyanocobalamin  (VITAMIN B12) 1000 MCG/ML injection Inject 1 mL (1,000 mcg total) into the muscle every 30 (thirty) days. KEEP APPT FOR FUTURE REFILLS 1 mL 2   diazepam  (VALIUM ) 5 MG tablet Take 1 tablet (5 mg total) by mouth every 8 (eight) hours as needed. for anxiety 30 tablet 1   diltiazem  (CARDIZEM  CD) 180 MG 24 hr capsule TAKE 1 CAPSULE BY MOUTH EVERY DAY 30 capsule 2   flecainide  (TAMBOCOR ) 100 MG tablet Take 1 tablet (100 mg total) by mouth 2 (two) times daily. 180 tablet 3   fluticasone  (FLONASE ) 50 MCG/ACT nasal spray      losartan  (COZAAR ) 25 MG tablet Take 1 tablet (25 mg total) by mouth daily. 90 tablet 3   TURMERIC PO Take 1,000 mg by mouth 2 (two) times daily.     vedolizumab  (ENTYVIO ) 300 MG injection Inject 300 mg into the vein every 8 (eight) weeks. 1 each 0   No current facility-administered medications for this visit.   Allergies  Allergen Reactions   Biaxin [Clarithromycin] Other (See Comments)    Pt has history of arrhythmias   Other Other (See Comments)   Penicillins Rash    Did it involve swelling of the face/tongue/throat, SOB, or low BP? Unknown  Did it involve sudden or severe rash/hives, skin peeling, or any reaction on the  inside of your mouth or nose? Unknown  Did you need to seek medical attention at a hospital or doctor's office? Unknown  When did it last happen?      childhood allergy  If all above answers are NO, may proceed with cephalosporin use.     Review of Systems: All systems reviewed and negative except where noted in HPI.   Lab Results  Component Value Date   WBC 7.4 09/02/2023   HGB 14.0 09/02/2023   HCT 43.5 09/02/2023   MCV 87.4 09/02/2023   PLT 266.0 09/02/2023    Lab Results  Component Value Date   NA 141 09/02/2023   CL 104 09/02/2023   K 4.1 09/02/2023   CO2 30 09/02/2023  BUN 13 09/02/2023   CREATININE 0.82 09/02/2023   GFR 96.06 09/02/2023   CALCIUM 8.9 09/02/2023   ALBUMIN 3.8 09/02/2023   GLUCOSE 86 09/02/2023    Lab Results  Component Value Date   ALT 21 09/02/2023   AST 15 09/02/2023   ALKPHOS 46 09/02/2023   BILITOT 0.4 09/02/2023     Physical Exam: BP 126/74   Pulse 72   Ht 6' (1.829 m)   Wt (!) 352 lb 4 oz (159.8 kg)   BMI 47.77 kg/m  Constitutional: Pleasant,well-developed, male in no acute distress. Neurological: Alert and oriented to person place and time. Psychiatric: Normal mood and affect. Behavior is normal.   ASSESSMENT: 58 y.o. male here for assessment of the following  1. 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 Abnormal finding on CT scan    As above, failed mesalamine , was not doing well and transitioned him to his first biologic, now on Entyvio .  He has responded really well to the regimen.  He has not had any flares since starting this over 2 years ago.  His colonoscopy last year showed that he was in remission.  We discussed goals for treatment of colitis to include control of symptoms and reduction for his risk and colon cancer.  He understands this and currently feeling well.  We will continue his current regimen.  His last colonoscopy was a year ago, I do not think he needs another one currently.  He is due  for basic labs, has been about a year since that was done.  I discussed this with him and offered to do them today however he is seeing his primary care next week for lab work and we will plan to do it with them.  We discussed his Entyvio  in general, chose this due to its favorable safety profile.  He inquires about subcutaneous option of Entyvio .  We discussed this for a bit, he does not think he can give himself injections every 2 weeks and declines that option for now, preferring to stick with IV infusions.  Otherwise reviewed his prior CT scan with him again concerning for an adrenal lesion that was greater than 4 cm in size.  I had recommended a repeat CT last year for this, he saw his urologist who agreed with that however states it was never done and despite contacting his urologist office he has not been able to schedule.  I offered to schedule the CT scan for him to further evaluate, will be done at [place] hopefully which is closer to his home.  Given his colitis appears controlled I think he is okay to see me yearly for now, but certainly sooner if issues should arise.  Of note we discussed his history of C. difficile, he will be mindful if ever receiving antibiotics for recurrent symptoms   PLAN: - continue Entyvio  - doing well - discussed SQ formulation, he declines that for now, will continue IV infusions - yearly labs with PCP planned for next week at his physical - CT abdomen / pelvis - adrenal gland re-evaluation, did not have it done after our discussion last year - f/u yearly with me or sooner with issues  Marcey Naval, MD [facility]

## 2024-08-25 NOTE — Patient Instructions (Addendum)
 Continue Entyvio   You have been scheduled for a CT scan of the abdomen and pelvis at [facility], [facility]. The address can be found later in this AVS under upcoming appointment. You are scheduled on Wednesday, October 29th at 10:00am. You should arrive at 9:45am for registration.    If you have any questions regarding your exam or if you need to reschedule, you may call [facility] Scheduling at [phone] between the hours of 8:00 am and 5:00 pm, Monday-Friday.   Please follow up in 1 year.  Thank you for entrusting me with your care and for choosing [facility], Dr. Elspeth Naval    _______________________________________________________  If your blood pressure at your visit was 140/90 or greater, please contact your primary care physician to follow up on this.  _______________________________________________________  If you are age 30 or older, your body mass index should be between 23-30. Your Body mass index is 47.77 kg/m. If this is out of the aforementioned range listed, please consider follow up with your Primary Care Provider.  If you are age 79 or younger, your body mass index should be between 19-25. Your Body mass index is 47.77 kg/m. If this is out of the aformentioned range listed, please consider follow up with your Primary Care Provider.   ________________________________________________________  The [facility] providers would like to encourage you to use MYCHART to communicate with providers for non-urgent requests or questions.  Due to long hold times on the telephone, sending your provider a message by [facility] may be a faster and more efficient way to get a response.  Please allow 48 business hours for a response.  Please remember that this is for non-urgent requests.  _______________________________________________________  [facility] is using a team-based approach to care.  Your team is made up of your doctor  and two to three APPS. Our APPS (Nurse Practitioners and Physician Assistants) work with your physician to ensure care continuity for you. They are fully qualified to address your health concerns and develop a treatment plan. They communicate directly with your gastroenterologist to care for you. Seeing the Advanced Practice Practitioners on your physician's team can help you by facilitating care more promptly, often allowing for earlier appointments, access to diagnostic testing, procedures, and other specialty referrals.

## 2024-08-31 ENCOUNTER — Ambulatory Visit: Admission: RE | Admit: 2024-08-31 | Source: Ambulatory Visit

## 2024-09-03 ENCOUNTER — Other Ambulatory Visit: Payer: Self-pay | Admitting: Gastroenterology

## 2024-09-13 NOTE — Progress Notes (Unsigned)
     Jenesa Foresta T. Kathlean Cinco, MD, CAQ Sports Medicine [facility] [address] [place], [address]  Phone: [phone]  FAX: [phone]  Trace Wirick - 58 y.o. male  MRN [id number]  Date of Birth: 1966-01-03  Date: 09/14/2024  PCP: Watt Mirza, MD  Referral: Watt Mirza, MD  No chief complaint on file.  Subjective:   Jailin Moomaw is a 58 y.o. very pleasant male patient with There is no height or weight on file to calculate BMI. who presents with the following:  Discussed the use of AI scribe software for clinical note transcription with the patient, who gave verbal consent to proceed.  Addie presents with a cough and poor sleep. History of Present Illness     Review of Systems is noted in the HPI, as appropriate  Objective:   There were no vitals taken for this visit.  GEN: No acute distress; alert,appropriate. PULM: Breathing comfortably in no respiratory distress PSYCH: Normally interactive.   Laboratory and Imaging Data:  Assessment and Plan:   No diagnosis found. Assessment & Plan   Medication Management during today's office visit: No orders of the defined types were placed in this encounter.  There are no discontinued medications.  Orders placed today for conditions managed today: No orders of the defined types were placed in this encounter.   Disposition: No follow-ups on file.  [employer] One speech-to-text software was used for transcription in this dictation.  Possible transcriptional errors can occur using Animal nutritionist.   Signed,  Mirza DASEN. Akiyah Eppolito, MD   Outpatient Encounter Medications as of 09/14/2024  Medication Sig   BD INTEGRA SYRINGE 25G X 1 3 ML MISC USE TO INJECT B12   cyanocobalamin  (VITAMIN B12) 1000 MCG/ML injection Inject 1 mL (1,000 mcg total) into the muscle every 30 (thirty) days.   diazepam  (VALIUM ) 5 MG tablet Take 1 tablet (5 mg total) by mouth every 8  (eight) hours as needed. for anxiety   diltiazem  (CARDIZEM  CD) 180 MG 24 hr capsule TAKE 1 CAPSULE BY MOUTH EVERY DAY   flecainide  (TAMBOCOR ) 100 MG tablet Take 1 tablet (100 mg total) by mouth 2 (two) times daily.   fluticasone  (FLONASE ) 50 MCG/ACT nasal spray    losartan  (COZAAR ) 25 MG tablet Take 1 tablet (25 mg total) by mouth daily.   TURMERIC PO Take 1,000 mg by mouth 2 (two) times daily.   vedolizumab  (ENTYVIO ) 300 MG injection Inject 300 mg into the vein every 8 (eight) weeks.   No facility-administered encounter medications on file as of 09/14/2024.

## 2024-09-14 ENCOUNTER — Ambulatory Visit: Admitting: Family Medicine

## 2024-09-14 ENCOUNTER — Encounter: Payer: Self-pay | Admitting: Family Medicine

## 2024-09-14 VITALS — BP 140/80 | HR 74 | Temp 98.1°F | Ht 72.0 in | Wt 355.5 lb

## 2024-09-14 DIAGNOSIS — J069 Acute upper respiratory infection, unspecified: Secondary | ICD-10-CM

## 2024-09-14 DIAGNOSIS — R051 Acute cough: Secondary | ICD-10-CM

## 2024-09-14 LAB — POC INFLUENZA A&B (BINAX/QUICKVUE)
Influenza A, POC: NEGATIVE
Influenza B, POC: NEGATIVE

## 2024-09-14 LAB — POC COVID19 BINAXNOW: SARS Coronavirus 2 Ag: NEGATIVE

## 2024-09-14 MED ORDER — PREDNISONE 20 MG PO TABS: ORAL_TABLET | ORAL | 0 refills | Status: DC

## 2024-09-21 ENCOUNTER — Other Ambulatory Visit (INDEPENDENT_AMBULATORY_CARE_PROVIDER_SITE_OTHER)

## 2024-09-21 DIAGNOSIS — R9389 Abnormal findings on diagnostic imaging of other specified body structures: Secondary | ICD-10-CM

## 2024-09-21 DIAGNOSIS — E038 Other specified hypothyroidism: Secondary | ICD-10-CM | POA: Diagnosis not present

## 2024-09-21 DIAGNOSIS — Z6841 Body Mass Index (BMI) 40.0 and over, adult: Secondary | ICD-10-CM

## 2024-09-21 DIAGNOSIS — I10 Essential (primary) hypertension: Secondary | ICD-10-CM

## 2024-09-21 DIAGNOSIS — Z79899 Other long term (current) drug therapy: Secondary | ICD-10-CM | POA: Diagnosis not present

## 2024-09-21 DIAGNOSIS — Z125 Encounter for screening for malignant neoplasm of prostate: Secondary | ICD-10-CM | POA: Diagnosis not present

## 2024-09-21 LAB — CBC WITH DIFFERENTIAL/PLATELET
Basophils Absolute: 0.1 K/uL (ref 0.0–0.1)
Basophils Relative: 0.8 % (ref 0.0–3.0)
Eosinophils Absolute: 0.3 K/uL (ref 0.0–0.7)
Eosinophils Relative: 3.3 % (ref 0.0–5.0)
HCT: 45.6 % (ref 39.0–52.0)
Hemoglobin: 15.2 g/dL (ref 13.0–17.0)
Lymphocytes Relative: 19.3 % (ref 12.0–46.0)
Lymphs Abs: 1.9 K/uL (ref 0.7–4.0)
MCHC: 33.3 g/dL (ref 30.0–36.0)
MCV: 87.5 fl (ref 78.0–100.0)
Monocytes Absolute: 1.1 K/uL — ABNORMAL HIGH (ref 0.1–1.0)
Monocytes Relative: 11.1 % (ref 3.0–12.0)
Neutro Abs: 6.4 K/uL (ref 1.4–7.7)
Neutrophils Relative %: 65.5 % (ref 43.0–77.0)
Platelets: 266 K/uL (ref 150.0–400.0)
RBC: 5.22 Mil/uL (ref 4.22–5.81)
RDW: 15.7 % — ABNORMAL HIGH (ref 11.5–15.5)
WBC: 9.7 K/uL (ref 4.0–10.5)

## 2024-09-21 LAB — TSH: TSH: 3.13 u[IU]/mL (ref 0.35–5.50)

## 2024-09-21 LAB — BASIC METABOLIC PANEL WITH GFR
BUN: 14 mg/dL (ref 6–23)
CO2: 31 meq/L (ref 19–32)
Calcium: 8.6 mg/dL (ref 8.4–10.5)
Chloride: 102 meq/L (ref 96–112)
Creatinine, Ser: 0.93 mg/dL (ref 0.40–1.50)
GFR: 89.13 mL/min (ref >60.00)
Glucose, Bld: 86 mg/dL (ref 70–99)
Potassium: 4.4 meq/L (ref 3.5–5.1)
Sodium: 139 meq/L (ref 135–145)

## 2024-09-21 LAB — HEPATIC FUNCTION PANEL
ALT: 29 U/L (ref 0–53)
AST: 21 U/L (ref 0–37)
Albumin: 3.9 g/dL (ref 3.5–5.2)
Alkaline Phosphatase: 51 U/L (ref 39–117)
Bilirubin, Direct: 0.1 mg/dL (ref 0.0–0.3)
Total Bilirubin: 0.5 mg/dL (ref 0.2–1.2)
Total Protein: 6.6 g/dL (ref 6.0–8.3)

## 2024-09-21 LAB — T4, FREE: Free T4: 0.78 ng/dL (ref 0.60–1.60)

## 2024-09-21 LAB — LIPID PANEL
Cholesterol: 130 mg/dL (ref 0–200)
HDL: 28.2 mg/dL — ABNORMAL LOW (ref >39.00)
LDL Cholesterol: 81 mg/dL (ref 0–99)
NonHDL: 101.78
Total CHOL/HDL Ratio: 5
Triglycerides: 103 mg/dL (ref 0.0–149.0)
VLDL: 20.6 mg/dL (ref 0.0–40.0)

## 2024-09-21 LAB — HEMOGLOBIN A1C: Hgb A1c MFr Bld: 5.6 % (ref 4.6–6.5)

## 2024-09-21 LAB — PSA: PSA: 1.14 ng/mL (ref 0.10–4.00)

## 2024-09-21 LAB — T3, FREE: T3, Free: 3.6 pg/mL (ref 2.3–4.2)

## 2024-09-27 ENCOUNTER — Encounter: Payer: Self-pay | Admitting: Family Medicine

## 2024-09-27 NOTE — Progress Notes (Addendum)
 Brandon Olmo T. [name], MD, CAQ Sports Medicine [facility] at [facility] [address] [place], [address]  Phone: [phone]  FAX: [phone]  Brandon [name] - 58 y.o. male  MRN [id number]  Date of Birth: 1966/01/02  Date: 09/28/2024  PCP: Watt Mirza, MD  Referral: Watt Mirza, MD  Chief Complaint  Patient presents with   Annual Exam   Patient Care Team: Watt Mirza, MD as PCP - General Perla, Evalene PARAS, MD as PCP - Cardiology (Cardiology) Perla Evalene PARAS, MD as Consulting Physician (Cardiology) Subjective:   Brandon [name] is a 58 y.o. pleasant patient who presents with the following:  Discussed the use of AI scribe software for clinical note transcription with the patient, who gave verbal consent to proceed.  History of Present Illness Brandon [name] is a 58 year old male who presents for an annual physical exam.  He has a history of colitis, which has led him to stop consuming alcohol. He quit using tobacco products two to three years ago due to an unpleasant taste during a period of illness related to colitis. 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  He is currently taking flecainide , diltiazem , and losartan .  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 He recalls discussing this with urology during a visit for a kidney stone, but no follow-up was scheduled. He has not had a CT scan due to insurance issues, and no colonoscopy was performed as previously planned. An adrenal mass was first noted in 2007 on the left side, with the right side being clear at that time, and it is the right adrenal mass that is significantly concerning.SABRA  He reports a significant weight loss in the past by avoiding flavorful foods, losing 100 pounds, but regaining the weight upon resuming a more  typical diet. He expresses frustration with maintaining weight loss.  He received a flu shot earlier this year. No chest pain or shortness of breath during physical activities. 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   Preventative Health Maintenance Visit:  Health Maintenance Summary Reviewed and updated, unless pt declines services.  Tobacco History Reviewed.  Quit dipping. Alcohol: No concerns, no excessive use Exercise Habits: Some activity, rec at least 30 mins 5 times a week STD concerns: no risk or activity to increase risk Drug Use: None  COVID booster Follow-up colonoscopy - GI no Flu shot  Health Maintenance  Topic Date Due   HIV Screening  Never done   COVID-19 Vaccine (3 - Pfizer risk series) 03/21/2020   Colonoscopy  06/21/2024   DTaP/Tdap/Td (3 - Td or Tdap) 09/01/2032   Pneumococcal Vaccine: 50+ Years  Completed   Influenza Vaccine  Completed   Hepatitis C Screening  Completed   Zoster Vaccines- Shingrix   Completed   Hepatitis B Vaccines 19-59 Average Risk  Aged Out   HPV VACCINES  Aged Out   Meningococcal B Vaccine  Aged Out   Immunization History  Administered Date(s) Administered   Influenza Whole 11/15/2009, 07/14/2011   Influenza, Seasonal, Injecte, Preservative Fre 11/08/2012, 09/09/2023, 09/28/2024   Influenza,inj,Quad PF,6+ Mos 08/24/2013, 08/16/2014, 08/20/2015, 06/23/2016, 10/08/2017, 08/19/2018, 08/01/2019, 08/27/2020, 09/01/2022   PFIZER Comirnaty(Gray Top)Covid-19 Tri-Sucrose Vaccine 02/01/2020, 02/22/2020   PNEUMOCOCCAL CONJUGATE-20 09/01/2022   Td 12/04/2010   Tdap 09/01/2022   Zoster Recombinant(Shingrix ) 09/09/2023, 12/09/2023   Patient Active  Problem List   Diagnosis Date Noted   ATRIAL FIBRILLATION, PAROXYSMAL 06/07/2009    Priority: High   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) 06/12/2022    Priority: Medium    Essential hypertension 06/16/2013    Priority: Medium    Morbid obesity with BMI of  40.0-44.9, adult (HCC) 06/07/2009    Priority: Medium    Cervical disc disorder with radiculopathy of cervical region 10/05/2013   Basal cell carcinoma 06/25/2011   DIVERTICULITIS, COLON 06/07/2009    Past Medical History:  Diagnosis Date   ATRIAL FIBRILLATION, PAROXYSMAL 06/07/2009   Basal cell carcinoma    CAD (coronary artery disease)    Carpal tunnel syndrome of right wrist 03/2019   Cervical disc disorder with radiculopathy of cervical region 10/05/2013   Classical migraine 03/03/2012   DEPRESSION 06/07/2009   DIVERTICULITIS, COLON 06/07/2009   Foraminal stenosis of cervical region    GERD (gastroesophageal reflux disease)    History of kidney stones    Hypertension    Morbid obesity with BMI of 40.0-44.9, adult (HCC)    PVC (premature ventricular contraction)    Ulcerative colitis (HCC) 04/28/2018   Ventricular ectopy     Past Surgical History:  Procedure Laterality Date   CARDIAC CATHETERIZATION     COLONOSCOPY  05/2014   COLONOSCOPY  06/22/2023   [facility]   COLONOSCOPY WITH PROPOFOL  N/A 04/20/2018   Procedure: COLONOSCOPY WITH PROPOFOL ;  Surgeon: Gaylyn Gladis PENNER, MD;  Location: [facility] ENDOSCOPY;  Service: Endoscopy;  Laterality: N/A;   COLONOSCOPY WITH PROPOFOL  N/A 06/20/2019   Procedure: COLONOSCOPY WITH PROPOFOL ;  Surgeon: Gaylyn Gladis PENNER, MD;  Location: [facility] ENDOSCOPY;  Service: Endoscopy;  Laterality: N/A;   COLONOSCOPY WITH PROPOFOL   06/2022   CYSTOSCOPY/URETEROSCOPY/HOLMIUM LASER/STENT PLACEMENT Right 07/22/2022   Procedure: CYSTOSCOPY/URETEROSCOPY/HOLMIUM LASER/STENT PLACEMENT;  Surgeon: Twylla Glendia BROCKS, MD;  Location: [facility] ORS;  Service: Urology;  Laterality: Right;   FOOT SURGERY     left foot   kidney stone removal      SHOULDER ARTHROSCOPY WITH SUBACROMIAL DECOMPRESSION AND BICEP TENDON REPAIR Right 12/14/2018   Procedure: SHOULDER ARTHROSCOPY WITH EBRIDEMENT, DECOMPRESSION, DISTAL CLAVICLE EXCISION, BICEP TENODESIS AND DEBRIDEMENT,  AND REPAIR OF ROTATOR CUFF TEAR;  Surgeon: Edie Norleen PARAS, MD;  Location: [facility] ORS;  Service: Orthopedics;  Laterality: Right;   SHOULDER ARTHROSCOPY WITH SUBACROMIAL DECOMPRESSION, ROTATOR CUFF REPAIR AND BICEP TENDON REPAIR Left 02/12/2021   Procedure: LEFT SHOULDER ARTHROSCOPY WITH DEBRIDEMENT, DECOMPRESSION, ROTATOR CUFF REPAIR, BICEPS TENODESIS, AND DISTAL CLAVICLE EXCISION;  Surgeon: Edie Norleen PARAS, MD;  Location: [facility] ORS;  Service: Orthopedics;  Laterality: Left;   TONSILLECTOMY      Family History  Problem Relation Age of Onset   Colon polyps Mother    Skin cancer Mother    Stroke Father    Skin cancer Brother    Arthritis Other    Diabetes Other        1st degree relative   Stroke Other        F 1st degree relative <60; M 1st degree relative <50   Colon cancer Neg Hx    Stomach cancer Neg Hx    Esophageal cancer Neg Hx    Rectal cancer Neg Hx     Social History   Social History Narrative   Does not do regular exercise.    Lives alone    Past Medical History, Surgical History, Social History, Family History, Problem List, Medications, and Allergies have been reviewed and updated if relevant.  Review of Systems: Pertinent positives are listed above.  Otherwise, a full 14 point review of systems has been done in full and it is negative except where it is noted positive.  Objective:   BP (!) 140/84   Pulse 93   Temp 98.2 F (36.8 C) (Temporal)   Ht 5' 11.5 (1.816 m)   Wt (!) 349 lb 2 oz (158.4 kg)   SpO2 97%   BMI 48.01 kg/m  Ideal Body Weight: Weight in (lb) to have BMI = 25: 181.4  Ideal Body Weight: Weight in (lb) to have BMI = 25: 181.4 No results found.    04/22/2024   10:06 AM 10/30/2023   10:17 AM 09/09/2023   10:18 AM 09/01/2022    9:50 AM 08/01/2019    2:31 PM  Depression screen PHQ 2/9  Decreased Interest 0 0 0 0 0  Down, Depressed, Hopeless 0 0 0 0 0  PHQ - 2 Score 0 0 0 0 0     GEN: well developed, well nourished, no acute distress Eyes:  conjunctiva and lids normal, PERRLA, EOMI ENT: TM clear, nares clear, oral exam WNL Neck: supple, no lymphadenopathy, no thyromegaly, no JVD Pulm: clear to auscultation and percussion, respiratory effort normal CV: regular rate and rhythm, S1-S2, no murmur, rub or gallop, no bruits, peripheral pulses normal and symmetric, no cyanosis, clubbing, edema or varicosities GI: soft, non-tender; no hepatosplenomegaly, masses; active bowel sounds all quadrants GU: deferred Lymph: no cervical, axillary or inguinal adenopathy MSK: gait normal, muscle tone and strength WNL, no joint swelling, effusions, discoloration, crepitus  SKIN: clear, good turgor, color WNL, no rashes, lesions, or ulcerations Neuro: normal mental status, normal strength, sensation, and motion Psych: alert; oriented to person, place and time, normally interactive and not anxious or depressed in appearance.  All labs reviewed with patient. Results for orders placed or performed in visit on 09/21/24  Basic metabolic panel   Collection Time: 09/21/24  7:28 AM  Result Value Ref Range   Sodium 139 135 - 145 mEq/L   Potassium 4.4 3.5 - 5.1 mEq/L   Chloride 102 96 - 112 mEq/L   CO2 31 19 - 32 mEq/L   Glucose, Bld 86 70 - 99 mg/dL   BUN 14 6 - 23 mg/dL   Creatinine, Ser 9.06 0.40 - 1.50 mg/dL   GFR 10.86 >39.99 mL/min   Calcium 8.6 8.4 - 10.5 mg/dL  Hemoglobin J8r   Collection Time: 09/21/24  7:28 AM  Result Value Ref Range   Hgb A1c MFr Bld 5.6 4.6 - 6.5 %  Hepatic Function Panel   Collection Time: 09/21/24  7:28 AM  Result Value Ref Range   Total Bilirubin 0.5 0.2 - 1.2 mg/dL   Bilirubin, Direct 0.1 0.0 - 0.3 mg/dL   Alkaline Phosphatase 51 39 - 117 U/L   AST 21 0 - 37 U/L   ALT 29 0 - 53 U/L   Total Protein 6.6 6.0 - 8.3 g/dL   Albumin 3.9 3.5 - 5.2 g/dL  T4, free   Collection Time: 09/21/24  7:28 AM  Result Value Ref Range   Free T4 0.78 0.60 - 1.60 ng/dL  T3, free   Collection Time: 09/21/24  7:28 AM  Result  Value Ref Range   T3, Free 3.6 2.3 - 4.2 pg/mL  TSH   Collection Time: 09/21/24  7:28 AM  Result Value Ref Range   TSH 3.13 0.35 - 5.50 uIU/mL  PSA   Collection Time: 09/21/24  7:28 AM  Result Value Ref Range   PSA 1.14 0.10 - 4.00 ng/mL  Lipid panel   Collection Time: 09/21/24  7:28 AM  Result Value Ref Range   Cholesterol 130 0 - 200 mg/dL   Triglycerides 896.9 0.0 - 149.0 mg/dL   HDL 71.79 (L) >60.99 mg/dL   VLDL 79.3 0.0 - 59.9 mg/dL   LDL Cholesterol 81 0 - 99 mg/dL   Total CHOL/HDL Ratio 5    NonHDL 101.78   CBC with Differential/Platelet   Collection Time: 09/21/24  7:28 AM  Result Value Ref Range   WBC 9.7 4.0 - 10.5 K/uL   RBC 5.22 4.22 - 5.81 Mil/uL   Hemoglobin 15.2 13.0 - 17.0 g/dL   HCT 54.3 60.9 - 47.9 %   MCV 87.5 78.0 - 100.0 fl   MCHC 33.3 30.0 - 36.0 g/dL   RDW 84.2 (H) 88.4 - 84.4 %   Platelets 266.0 150.0 - 400.0 K/uL   Neutrophils Relative % 65.5 43.0 - 77.0 %   Lymphocytes Relative 19.3 12.0 - 46.0 %   Monocytes Relative 11.1 3.0 - 12.0 %   Eosinophils Relative 3.3 0.0 - 5.0 %   Basophils Relative 0.8 0.0 - 3.0 %   Neutro Abs 6.4 1.4 - 7.7 K/uL   Lymphs Abs 1.9 0.7 - 4.0 K/uL   Monocytes Absolute 1.1 (H) 0.1 - 1.0 K/uL   Eosinophils Absolute 0.3 0.0 - 0.7 K/uL   Basophils Absolute 0.1 0.0 - 0.1 K/uL    Assessment and Plan:     ICD-10-CM   1. Healthcare maintenance  Z00.00     2. Need for influenza vaccination  Z23 Flu vaccine trivalent PF, 6mos and older(Flulaval,Afluria,Fluarix,Fluzone)    CANCELED: Flu vaccine trivalent PF, 6mos and older(Flulaval,Afluria,Fluarix,Fluzone)    3. Adrenal mass greater than 4 cm in diameter  E27.8 Ambulatory referral to Endocrinology    Ambulatory referral to Urology    CT ABDOMEN PELVIS W CONTRAST    4. 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 Blood pressure slightly elevated. Normal blood work  including kidney, liver, thyroid , and PSA.  - Continue current medications: flecainide , diltiazem , losartan . - Encouraged regular physical activity and healthy diet.  Benign left adrenal myolipoma (adrenal mass) 4.8 x 4.3 cm left adrenal adenoma.  Concern is right sided 4.3 cm myolipoma.  Radiology suggest surgical consultation recommended for masses >4 cm with myolipoma. Risk of rupture discussed. Endocrinology referral needed for hormonal testing. Previous CT scan likely not covered based on diagnosis codes use at the time that it was ordered - Ordered CT scan of abdomen and pelvis with contrast to evaluate growth of myolipoma of the right kidney. - Referred to endocrinology for hormonal testing. - I sent a message to the patient's urologist, as well.  Insurance prefers this imaging location: [facility]  [address]  [place], [place] [address]   Hypertension Blood pressure slightly elevated. Managed with losartan . - Continue losartan  25 mg daily.  Atrial fibrillation (on flecainide  and diltiazem ) Atrial fibrillation managed with flecainide  and diltiazem . Heart rhythm stable. - Continue flecainide  100 mg twice daily. - Continue diltiazem  180 mg daily.  Obesity Weight management discussed. Previous weight loss achieved but regained. Challenges with maintaining weight loss discussed. - Encouraged healthy eating habits and regular physical activity.  Immunization received Flu shot received earlier this year. -  Continue annual flu vaccinations.  Health Maintenance Exam: The patient's preventative maintenance and recommended screening tests for an annual wellness exam were reviewed in full today. Brought up to date unless services declined.  Counselled on the importance of diet, exercise, and its role in overall health and mortality. The patient's FH and SH was reviewed, including their home life, tobacco status, and drug and alcohol  status.  Follow-up in 1 year for physical exam or additional follow-up below.  Disposition: No follow-ups on file.  No orders of the defined types were placed in this encounter.  Medications Discontinued During This Encounter  Medication Reason   predniSONE  (DELTASONE ) 20 MG tablet Completed Course   Orders Placed This Encounter  Procedures   CT ABDOMEN PELVIS W CONTRAST   Flu vaccine trivalent PF, 6mos and older(Flulaval,Afluria,Fluarix,Fluzone)   Ambulatory referral to Endocrinology   Ambulatory referral to Urology    Signed,  Jacques T. Alaira Level, MD   Allergies as of 09/28/2024       Reactions   Biaxin [clarithromycin] Other (See Comments)   Pt has history of arrhythmias   Other Other (See Comments)   Penicillins Rash   Did it involve swelling of the face/tongue/throat, SOB, or low BP? Unknown Did it involve sudden or severe rash/hives, skin peeling, or any reaction on the inside of your mouth or nose? Unknown Did you need to seek medical attention at a hospital or doctor's office? Unknown When did it last happen?      childhood allergy If all above answers are NO, may proceed with cephalosporin use.        Medication List        Accurate as of September 28, 2024 11:59 PM. If you have any questions, ask your nurse or doctor.          STOP taking these medications    predniSONE  20 MG tablet Commonly known as: DELTASONE  Stopped by: Jacques Tynleigh Birt       TAKE these medications    BD Integra Syringe 25G X 1 3 ML Misc Generic drug: SYRINGE-NEEDLE (DISP) 3 ML USE TO INJECT B12   cyanocobalamin  1000 MCG/ML injection Commonly known as: VITAMIN B12 Inject 1 mL (1,000 mcg total) into the muscle every 30 (thirty) days.   diazepam  5 MG tablet Commonly known as: VALIUM  Take 1 tablet (5 mg total) by mouth every 8 (eight) hours as needed. for anxiety   diltiazem  180 MG 24 hr capsule Commonly known as: CARDIZEM  CD TAKE 1 CAPSULE BY MOUTH EVERY DAY    flecainide  100 MG tablet Commonly known as: TAMBOCOR  Take 1 tablet (100 mg total) by mouth 2 (two) times daily.   fluticasone  50 MCG/ACT nasal spray Commonly known as: FLONASE    losartan  25 MG tablet Commonly known as: COZAAR  Take 1 tablet (25 mg total) by mouth daily.   TURMERIC PO Take 1,000 mg by mouth 2 (two) times daily.   vedolizumab  300 MG injection Commonly known as: ENTYVIO  Inject 300 mg into the vein every 8 (eight) weeks.

## 2024-09-28 ENCOUNTER — Ambulatory Visit: Admitting: Family Medicine

## 2024-09-28 VITALS — BP 140/84 | HR 93 | Temp 98.2°F | Ht 71.5 in | Wt 349.1 lb

## 2024-09-28 DIAGNOSIS — Z23 Encounter for immunization: Secondary | ICD-10-CM | POA: Diagnosis not present

## 2024-09-28 DIAGNOSIS — E278 Other specified disorders of adrenal gland: Secondary | ICD-10-CM

## 2024-09-28 DIAGNOSIS — D1779 Benign lipomatous neoplasm of other sites: Secondary | ICD-10-CM | POA: Diagnosis not present

## 2024-09-28 DIAGNOSIS — Z0000 Encounter for general adult medical examination without abnormal findings: Secondary | ICD-10-CM

## 2024-09-30 ENCOUNTER — Encounter: Payer: Self-pay | Admitting: Family Medicine

## 2024-10-02 ENCOUNTER — Other Ambulatory Visit: Payer: Self-pay | Admitting: Cardiovascular Disease

## 2024-10-07 ENCOUNTER — Ambulatory Visit

## 2024-10-10 ENCOUNTER — Ambulatory Visit (INDEPENDENT_AMBULATORY_CARE_PROVIDER_SITE_OTHER)

## 2024-10-10 VITALS — BP 131/78 | HR 68 | Temp 98.1°F | Resp 18 | Ht 74.0 in | Wt 358.2 lb

## 2024-10-10 DIAGNOSIS — K51318 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: Secondary | ICD-10-CM

## 2024-10-10 MED ORDER — VEDOLIZUMAB 300 MG IV SOLR
300.0000 mg | Freq: Once | INTRAVENOUS | Status: AC
  Administered 2024-10-10: 300 mg via INTRAVENOUS
  Filled 2024-10-10: qty 5

## 2024-10-10 NOTE — Progress Notes (Signed)
 Diagnosis: 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   Provider:  Mannam, Praveen MD  Procedure: IV Infusion  IV Type: Peripheral, IV Location: R Antecubital   Entyvio  (Vedolizumab ), Dose: 300 mg  Infusion Start Time: 1332  Infusion Stop Time: 1403  Post Infusion IV Care: Peripheral IV Discontinued  Discharge: Condition: Good, Destination: Home . AVS Declined  Performed by:  Maximiano JONELLE Pouch, LPN

## 2024-10-19 NOTE — Addendum Note (Signed)
 Addended by: WATT MIRZA on: 10/19/2024 03:09 PM   Modules accepted: Orders

## 2024-10-29 ENCOUNTER — Other Ambulatory Visit: Payer: Self-pay | Admitting: Cardiovascular Disease

## 2024-10-31 NOTE — Telephone Encounter (Signed)
 Called patient and scheduled Heartcare OV. 12/12/24.

## 2024-11-09 ENCOUNTER — Encounter: Payer: Self-pay | Admitting: Urology

## 2024-11-09 ENCOUNTER — Ambulatory Visit (INDEPENDENT_AMBULATORY_CARE_PROVIDER_SITE_OTHER): Payer: Self-pay | Admitting: Urology

## 2024-11-09 ENCOUNTER — Encounter: Payer: Self-pay | Admitting: Gastroenterology

## 2024-11-09 VITALS — BP 130/80 | HR 74 | Ht 74.0 in | Wt 343.0 lb

## 2024-11-09 DIAGNOSIS — D1779 Benign lipomatous neoplasm of other sites: Secondary | ICD-10-CM | POA: Diagnosis not present

## 2024-11-09 DIAGNOSIS — E278 Other specified disorders of adrenal gland: Secondary | ICD-10-CM | POA: Diagnosis not present

## 2024-11-10 ENCOUNTER — Encounter: Payer: Self-pay | Admitting: Gastroenterology

## 2024-11-10 ENCOUNTER — Other Ambulatory Visit (HOSPITAL_COMMUNITY): Payer: Self-pay | Admitting: Gastroenterology

## 2024-11-10 NOTE — Progress Notes (Unsigned)
 "  11/09/2024 4:04 PM   Brandon [name] 1966/01/11 [id number]  Referring provider: Watt Mirza, MD [address] [place],  [place] [address]  Chief Complaint  Patient presents with   Other   Urologic history: 1.  Nephrolithiasis Status post right ureteroscopic stone removal 07/22/2022 Stone analysis 100% CaOxMono  2.  Adrenal mass   HPI: Brandon [name] is a 59 y.o. male presents in follow-up of a left adrenal mass.  He has  bilateral adrenal masses.  A 4.8 cm left adrenal adenoma and a 4.3 cm right myelolipoma When  I last saw him an endocrinology referral was placed for a functional evaluation however he was never seen. He has not had any follow-up imaging since 2023.  CT scans have been ordered by his PCP and gastroenterology.  He states they initially were denied but subsequently approved however he states he currently no longer has this insurance coverage Denies flank, abdominal or pelvic pain.   PMH: Past Medical History:  Diagnosis Date   ATRIAL FIBRILLATION, PAROXYSMAL 06/07/2009   Basal cell carcinoma    CAD (coronary artery disease)    Carpal tunnel syndrome of right wrist 03/2019   Cervical disc disorder with radiculopathy of cervical region 10/05/2013   Classical migraine 03/03/2012   DEPRESSION 06/07/2009   DIVERTICULITIS, COLON 06/07/2009   Foraminal stenosis of cervical region    GERD (gastroesophageal reflux disease)    History of kidney stones    Hypertension    Morbid obesity with BMI of 40.0-44.9, adult (HCC)    PVC (premature ventricular contraction)    Ulcerative colitis (HCC) 04/28/2018   Ventricular ectopy     Surgical History: Past Surgical History:  Procedure Laterality Date   CARDIAC CATHETERIZATION     COLONOSCOPY  05/2014   COLONOSCOPY  06/22/2023   [facility]   COLONOSCOPY WITH PROPOFOL  N/A 04/20/2018   Procedure: COLONOSCOPY WITH PROPOFOL ;  Surgeon: Gaylyn Gladis PENNER, MD;  Location: [facility] ENDOSCOPY;   Service: Endoscopy;  Laterality: N/A;   COLONOSCOPY WITH PROPOFOL  N/A 06/20/2019   Procedure: COLONOSCOPY WITH PROPOFOL ;  Surgeon: Gaylyn Gladis PENNER, MD;  Location: [facility] ENDOSCOPY;  Service: Endoscopy;  Laterality: N/A;   COLONOSCOPY WITH PROPOFOL   06/2022   CYSTOSCOPY/URETEROSCOPY/HOLMIUM LASER/STENT PLACEMENT Right 07/22/2022   Procedure: CYSTOSCOPY/URETEROSCOPY/HOLMIUM LASER/STENT PLACEMENT;  Surgeon: Twylla Brandon BROCKS, MD;  Location: [facility] ORS;  Service: Urology;  Laterality: Right;   FOOT SURGERY     left foot   kidney stone removal      SHOULDER ARTHROSCOPY WITH SUBACROMIAL DECOMPRESSION AND BICEP TENDON REPAIR Right 12/14/2018   Procedure: SHOULDER ARTHROSCOPY WITH EBRIDEMENT, DECOMPRESSION, DISTAL CLAVICLE EXCISION, BICEP TENODESIS AND DEBRIDEMENT, AND REPAIR OF ROTATOR CUFF TEAR;  Surgeon: Edie Norleen PARAS, MD;  Location: [facility] ORS;  Service: Orthopedics;  Laterality: Right;   SHOULDER ARTHROSCOPY WITH SUBACROMIAL DECOMPRESSION, ROTATOR CUFF REPAIR AND BICEP TENDON REPAIR Left 02/12/2021   Procedure: LEFT SHOULDER ARTHROSCOPY WITH DEBRIDEMENT, DECOMPRESSION, ROTATOR CUFF REPAIR, BICEPS TENODESIS, AND DISTAL CLAVICLE EXCISION;  Surgeon: Edie Norleen PARAS, MD;  Location: [facility] ORS;  Service: Orthopedics;  Laterality: Left;   TONSILLECTOMY      Home Medications:  Allergies as of 11/09/2024       Reactions   Biaxin [clarithromycin] Other (See Comments)   Pt has history of arrhythmias   Other Other (See Comments)   Penicillins Rash   Did it involve swelling of the face/tongue/throat, SOB, or low BP? Unknown Did it involve sudden or severe rash/hives, skin peeling, or  any reaction on the inside of your mouth or nose? Unknown Did you need to seek medical attention at a hospital or doctor's office? Unknown When did it last happen?      childhood allergy If all above answers are NO, may proceed with cephalosporin use.        Medication List        Accurate as of November 09, 2024 11:59 PM.  If you have any questions, ask your nurse or doctor.          BD Integra Syringe 25G X 1 3 ML Misc Generic drug: SYRINGE-NEEDLE (DISP) 3 ML USE TO INJECT B12   cyanocobalamin  1000 MCG/ML injection Commonly known as: VITAMIN B12 Inject 1 mL (1,000 mcg total) into the muscle every 30 (thirty) days.   diazepam  5 MG tablet Commonly known as: VALIUM  Take 1 tablet (5 mg total) by mouth every 8 (eight) hours as needed. for anxiety   diltiazem  180 MG 24 hr capsule Commonly known as: CARDIZEM  CD TAKE 1 CAPSULE BY MOUTH EVERY DAY   flecainide  100 MG tablet Commonly known as: TAMBOCOR  TAKE 1 TABLET BY MOUTH TWICE A DAY   fluticasone  50 MCG/ACT nasal spray Commonly known as: FLONASE    losartan  25 MG tablet Commonly known as: COZAAR  TAKE 1 TABLET (25 MG TOTAL) BY MOUTH DAILY.   TURMERIC PO Take 1,000 mg by mouth 2 (two) times daily.   vedolizumab  300 MG injection Commonly known as: ENTYVIO  Inject 300 mg into the vein every 8 (eight) weeks.        Allergies: Allergies[1]  Family History: Family History  Problem Relation Age of Onset   Colon polyps Mother    Skin cancer Mother    Stroke Father    Skin cancer Brother    Arthritis Other    Diabetes Other        1st degree relative   Stroke Other        F 1st degree relative <60; M 1st degree relative <50   Colon cancer Neg Hx    Stomach cancer Neg Hx    Esophageal cancer Neg Hx    Rectal cancer Neg Hx     Social History:  reports that he has never smoked. He has quit using smokeless tobacco.  His smokeless tobacco use included snuff. He reports that he does not currently use alcohol. He reports that he does not use drugs.   Physical Exam: BP 130/80   Pulse 74   Ht 6' 2 (1.88 m)   Wt (!) 343 lb (155.6 kg)   BMI 44.04 kg/m   Constitutional:  Alert, No acute distress. HEENT: [place] AT Respiratory: Normal respiratory effort, no increased work of breathing.  Psychiatric: Normal mood and affect.   Assessment &  Plan:    1.  Bilateral adrenal masses Needs a functional evaluation from endocrinology Based on BMI and large adrenal masses surgery would need to be performed at a tertiary care center He needs current imaging.  He has CT scan ending by Dr. Watt and GI however states he currently does not have insurance. Will message Dr. Watt regarding tertiary referral for functional evaluation and surgical management   Brandon JAYSON Barba, MD  [facility] [address] [place], [place] [address] [phone]     [1]  Allergies Allergen Reactions   Biaxin [Clarithromycin] Other (See Comments)    Pt has history of arrhythmias   Other Other (See Comments)   Penicillins  Rash    Did it involve swelling of the face/tongue/throat, SOB, or low BP? Unknown  Did it involve sudden or severe rash/hives, skin peeling, or any reaction on the inside of your mouth or nose? Unknown  Did you need to seek medical attention at a hospital or doctor's office? Unknown  When did it last happen?      childhood allergy  If all above answers are NO, may proceed with cephalosporin use.   "

## 2024-11-10 NOTE — Progress Notes (Signed)
 Reviewed GI OV note from 08/25/2024. Patient clinically doing well on Entyvio  with plan to continue IV Entyvoi q 8 weeks. Patient declined transition to SQ Entyvio  at that time.  Treatment plan renewed  Klea Nall, PharmD, MPH, BCPS, CPP Clinical Pharmacist

## 2024-11-13 ENCOUNTER — Encounter: Payer: Self-pay | Admitting: Urology

## 2024-11-14 ENCOUNTER — Telehealth: Payer: Self-pay | Admitting: Gastroenterology

## 2024-11-14 ENCOUNTER — Telehealth: Payer: Self-pay | Admitting: Pharmacy Technician

## 2024-11-14 NOTE — Telephone Encounter (Signed)
 Patient informed of Dr Hassan note below. Patient states an office is a cost. Patient inquired if patient assistance that he had would help pay. Message sent to Luke Bend at the infusion center on [facility]. Will await her response.

## 2024-11-14 NOTE — Telephone Encounter (Signed)
 Spoke with patient. Patient reports he does not see having any insurance in the future.

## 2024-11-14 NOTE — Telephone Encounter (Signed)
 Inbound call from patient stating that he has lost his health insurance and was told to reach out to Dr. Leigh to inform him of this due to him taking Entyvio . Patient stated that he is not able to afford his Entyvio  infusions. Patient is requesting a call back. Please advise.

## 2024-11-14 NOTE — Telephone Encounter (Signed)
 Okay.  Without insurance our options are extremely limited for treatment of his colitis with biologic therapy.  Assuming he will likely flare at some point moving forward if he is not on anything for for it..  If he has no plans for insurance am not sure if he would be able to see us  in the office or get any medications in the future.  If he is willing to see me in the office to discuss his options moving forward despite lack of insurance, I am happy to do so, please book, follow-up with me. Thanks

## 2024-11-14 NOTE — Telephone Encounter (Signed)
 Patient is un-insure and will need Entyvio  PAP - Free drug program. Email has been sent to [employer]. Status: Pending  Auth Submission: PENDING Site of care: Site of care: [facility] Payer: FREE DRUG PROGRAM Medication & CPT/J Code(s) submitted: Entyvio  (Vedolizumab ) J3380 Diagnosis Code:  Route of submission (phone, fax, portal):  Phone # Fax # Auth type:  Units/visits requested:  Reference number:  Approval from:  to

## 2024-11-14 NOTE — Telephone Encounter (Signed)
 Can you clarify the insurance issue.  Does he have any plans to get insurance anytime soon?  I do not think Entyvio  is likely affordable without insurance, nor most biologic therapies.  If he has plans to get insurance in the next few months we can resume treatment after he has insurance.  In the interim his likely option would be with using steroids as needed for treatment if he has no insurance

## 2024-11-14 NOTE — Telephone Encounter (Signed)
 Received below message from [employer]. Patient informed. Patient verbalized understanding.

## 2024-11-21 ENCOUNTER — Encounter: Payer: Self-pay | Admitting: Gastroenterology

## 2024-11-21 NOTE — Telephone Encounter (Signed)
 Email sent to Brandon [name] to check on free drug program. Awaiting response.

## 2024-11-21 NOTE — Telephone Encounter (Signed)
 Received message from [employer]:  The paperwork was submitted by [employer] on 11/15/24. (it' still pending) Once it's been approved we will schedule patient as soon as possible.

## 2024-11-24 ENCOUNTER — Other Ambulatory Visit: Payer: Self-pay | Admitting: Cardiovascular Disease

## 2024-11-30 ENCOUNTER — Encounter: Payer: Self-pay | Admitting: Gastroenterology

## 2024-12-03 ENCOUNTER — Encounter: Payer: Self-pay | Admitting: Gastroenterology

## 2024-12-09 ENCOUNTER — Encounter: Payer: Self-pay | Admitting: Gastroenterology

## 2024-12-09 ENCOUNTER — Ambulatory Visit

## 2024-12-09 NOTE — Progress Notes (Signed)
 Patient presented for Entyvio  infusion today. Issue arose with insurance/ patient assistance. Spoke with infusion clinical pharmacy team. Unable to infuse patient today, pending resolution of issue. Pharmacy team to follow up, and patient will be rescheduled asap. Patient verbalized understanding.  Rocky [employer], RN

## 2024-12-12 ENCOUNTER — Ambulatory Visit: Payer: Self-pay | Admitting: Cardiovascular Disease

## 2025-03-22 ENCOUNTER — Ambulatory Visit: Payer: Self-pay | Admitting: "Endocrinology
